# Patient Record
Sex: Male | Born: 1985
Health system: Southern US, Community
[De-identification: ages and names within clinical notes are randomized; demographics above are authoritative.]

## PROBLEM LIST (undated history)

## (undated) DIAGNOSIS — G5600 Carpal tunnel syndrome, unspecified upper limb: Secondary | ICD-10-CM

## (undated) DIAGNOSIS — M5126 Other intervertebral disc displacement, lumbar region: Secondary | ICD-10-CM

## (undated) DIAGNOSIS — E669 Obesity, unspecified: Secondary | ICD-10-CM

## (undated) DIAGNOSIS — T7840XA Allergy, unspecified, initial encounter: Secondary | ICD-10-CM

## (undated) DIAGNOSIS — I10 Essential (primary) hypertension: Secondary | ICD-10-CM

## (undated) DIAGNOSIS — F419 Anxiety disorder, unspecified: Secondary | ICD-10-CM

## (undated) DIAGNOSIS — F909 Attention-deficit hyperactivity disorder, unspecified type: Secondary | ICD-10-CM

## (undated) DIAGNOSIS — J45909 Unspecified asthma, uncomplicated: Secondary | ICD-10-CM

## (undated) DIAGNOSIS — G4730 Sleep apnea, unspecified: Secondary | ICD-10-CM

## (undated) DIAGNOSIS — F32A Depression, unspecified: Secondary | ICD-10-CM

## (undated) DIAGNOSIS — G4700 Insomnia, unspecified: Secondary | ICD-10-CM

## (undated) DIAGNOSIS — D649 Anemia, unspecified: Secondary | ICD-10-CM

## (undated) HISTORY — DX: Essential (primary) hypertension: I10

## (undated) HISTORY — DX: Depression, unspecified: F32.A

## (undated) HISTORY — DX: Anemia, unspecified: D64.9

## (undated) HISTORY — PX: LUMBAR DISC SURGERY: SHX700

## (undated) HISTORY — DX: Obesity, unspecified: E66.9

## (undated) HISTORY — DX: Attention-deficit hyperactivity disorder, unspecified type: F90.9

## (undated) HISTORY — DX: Unspecified asthma, uncomplicated: J45.909

## (undated) HISTORY — DX: Carpal tunnel syndrome, unspecified upper limb: G56.00

## (undated) HISTORY — DX: Anxiety disorder, unspecified: F41.9

## (undated) HISTORY — DX: Insomnia, unspecified: G47.00

## (undated) HISTORY — PX: BACK SURGERY: SHX140

## (undated) HISTORY — DX: Allergy, unspecified, initial encounter: T78.40XA

## (undated) HISTORY — DX: Other intervertebral disc displacement, lumbar region: M51.26

---

## 2010-01-22 ENCOUNTER — Emergency Department (HOSPITAL_COMMUNITY): Admission: EM | Admit: 2010-01-22 | Discharge: 2010-01-22 | Payer: Self-pay | Admitting: Emergency Medicine

## 2011-09-17 ENCOUNTER — Ambulatory Visit: Payer: Self-pay

## 2011-09-17 DIAGNOSIS — G5600 Carpal tunnel syndrome, unspecified upper limb: Secondary | ICD-10-CM

## 2011-11-11 ENCOUNTER — Ambulatory Visit: Payer: Self-pay | Admitting: Family Medicine

## 2011-11-11 VITALS — BP 132/82 | HR 89 | Temp 98.2°F | Resp 16 | Ht 68.25 in | Wt 275.0 lb

## 2011-11-11 DIAGNOSIS — G5600 Carpal tunnel syndrome, unspecified upper limb: Secondary | ICD-10-CM

## 2011-11-11 DIAGNOSIS — G5602 Carpal tunnel syndrome, left upper limb: Secondary | ICD-10-CM | POA: Insufficient documentation

## 2011-11-11 MED ORDER — PREDNISONE 20 MG PO TABS: ORAL_TABLET | ORAL | Status: AC

## 2011-11-11 NOTE — Progress Notes (Signed)
  Subjective:    Patient ID: Brian Alvarez, male    DOB: Nov 24, 1985, 26 y.o.   MRN: [id number]  HPI 26 yo male with h/o carpal tunnel syndrome, last seen here 09/17/11 for same.  Had been unable to afford referral to ortho.  Was referred then for nerve conduction studies.  NCS done 3-4 weeks ago.  Report not avail but per patient was told was CTS.  Bilateral but R>L (right-handed) Frequent pain, tingling, dropping things.  Mobic and neurontin help some but less so lately.  Works at [employer], so the grasping/manipulating problems. Never had problems with it until he started working there about 6 months ago.    Review of Systems    Negative except as per HPI  Objective:   Physical Exam  Constitutional: He appears well-developed and well-nourished.  Pulmonary/Chest: Effort normal.  Neurological: He is alert.  Skin: Skin is warm and dry.   No muscle wasting of hands.  Good grip strength.  Pain with tinnel's.        Assessment & Plan:  CTS - at this point not yet ready for surgery, primarily from financial standpoint.  Wants to wait on referral to ortho.  Also, not interested at this time in injections.  Will try one round of oral steroids.  Continue ice.  Try to wear braces at night ( has at home)

## 2011-12-06 ENCOUNTER — Other Ambulatory Visit: Payer: Self-pay | Admitting: Family Medicine

## 2011-12-10 ENCOUNTER — Ambulatory Visit: Payer: Self-pay | Admitting: Family Medicine

## 2011-12-10 VITALS — BP 147/76 | HR 92 | Temp 98.3°F | Resp 16 | Ht 68.5 in | Wt 268.0 lb

## 2011-12-10 DIAGNOSIS — J069 Acute upper respiratory infection, unspecified: Secondary | ICD-10-CM

## 2011-12-10 DIAGNOSIS — J029 Acute pharyngitis, unspecified: Secondary | ICD-10-CM

## 2011-12-10 MED ORDER — AMOXICILLIN 875 MG PO TABS: 875.0000 mg | ORAL_TABLET | Freq: Two times a day (BID) | ORAL | Status: AC

## 2011-12-10 NOTE — Progress Notes (Signed)
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  On Wednesday, the symptoms had largely cleared, but over the past 24 hours the symptoms have gotten much worse.  Initially had a fever intermittently.  No fever in 24 hours.  Also notes some diarrhea yesterday and today only.  No blood in stool.  Mild abdominal discomfort and nausea, no cramps.  O:  NAD HEENT:  Unremarkable with exception of red uvula and anterior pharynx Chest:  Few faint wheezes Neck: supple, no adenopathy Heart:  Reg, no murmur  A:  URI  P: Amox bid x 7 days

## 2011-12-10 NOTE — Patient Instructions (Signed)
Amoxicillin capsules or tablets What is this medicine? AMOXICILLIN (a mox i SIL in) is a penicillin antibiotic. It is used to treat certain kinds of bacterial infections. It will not work for colds, flu, or other viral infections. This medicine may be used for other purposes; ask your health care provider or pharmacist if you have questions. What should I tell my health care provider before I take this medicine? They need to know if you have any of these conditions: -asthma -kidney disease -an unusual or allergic reaction to amoxicillin, other penicillins, cephalosporin antibiotics, other medicines, foods, dyes, or preservatives -pregnant or trying to get pregnant -breast-feeding How should I use this medicine? Take this medicine by mouth with a glass of water. Follow the directions on your prescription label. You may take this medicine with food or on an empty stomach. Take your medicine at regular intervals. Do not take your medicine more often than directed. Take all of your medicine as directed even if you think your are better. Do not skip doses or stop your medicine early. Talk to your pediatrician regarding the use of this medicine in children. While this drug may be prescribed for selected conditions, precautions do apply. Overdosage: If you think you have taken too much of this medicine contact a poison control center or emergency room at once. NOTE: This medicine is only for you. Do not share this medicine with others. What if I miss a dose? If you miss a dose, take it as soon as you can. If it is almost time for your next dose, take only that dose. Do not take double or extra doses. What may interact with this medicine? -amiloride -birth control pills -chloramphenicol -macrolides -probenecid -sulfonamides -tetracyclines This list may not describe all possible interactions. Give your health care provider a list of all the medicines, herbs, non-prescription drugs, or dietary  supplements you use. Also tell them if you smoke, drink alcohol, or use illegal drugs. Some items may interact with your medicine. What should I watch for while using this medicine? Tell your doctor or health care professional if your symptoms do not improve in 2 or 3 days. Take all of the doses of your medicine as directed. Do not skip doses or stop your medicine early. If you are diabetic, you may get a false positive result for sugar in your urine with certain brands of urine tests. Check with your doctor. Do not treat diarrhea with over-the-counter products. Contact your doctor if you have diarrhea that lasts more than 2 days or if the diarrhea is severe and watery. What side effects may I notice from receiving this medicine? Side effects that you should report to your doctor or health care professional as soon as possible: -allergic reactions like skin rash, itching or hives, swelling of the face, lips, or tongue -breathing problems -dark urine -redness, blistering, peeling or loosening of the skin, including inside the mouth -seizures -severe or watery diarrhea -trouble passing urine or change in the amount of urine -unusual bleeding or bruising -unusually weak or tired -yellowing of the eyes or skin Side effects that usually do not require medical attention (report to your doctor or health care professional if they continue or are bothersome): -dizziness -headache -stomach upset -trouble sleeping This list may not describe all possible side effects. Call your doctor for medical advice about side effects. You may report side effects to FDA at 1-800-FDA-1088. Where should I keep my medicine? Keep out of the reach of children. Store between  68 and 77 degrees F (20 and 25 degrees C). Keep bottle closed tightly. Throw away any unused medicine after the expiration date. NOTE: This sheet is a summary. It may not cover all possible information. If you have questions about this medicine, talk  to your doctor, pharmacist, or health care provider.  2012, [employer]. (11/28/2007 2:10:59 PM)

## 2011-12-26 ENCOUNTER — Ambulatory Visit: Payer: Self-pay | Admitting: Family Medicine

## 2011-12-26 DIAGNOSIS — J3081 Allergic rhinitis due to animal (cat) (dog) hair and dander: Secondary | ICD-10-CM

## 2011-12-26 DIAGNOSIS — J45909 Unspecified asthma, uncomplicated: Secondary | ICD-10-CM

## 2011-12-26 MED ORDER — MOMETASONE FURO-FORMOTEROL FUM 200-5 MCG/ACT IN AERO: 2.0000 | INHALATION_SPRAY | Freq: Two times a day (BID) | RESPIRATORY_TRACT | Status: DC

## 2011-12-26 MED ORDER — PREDNISONE 20 MG PO TABS: ORAL_TABLET | ORAL | Status: DC

## 2011-12-26 NOTE — Patient Instructions (Signed)
Asthma Attack Prevention HOW CAN ASTHMA BE PREVENTED? Currently, there is no way to prevent asthma from starting. However, you can take steps to control the disease and prevent its symptoms after you have been diagnosed. Learn about your asthma and how to control it. Take an active role to control your asthma by working with your caregiver to create and follow an asthma action plan. An asthma action plan guides you in taking your medicines properly, avoiding factors that make your asthma worse, tracking your level of asthma control, responding to worsening asthma, and seeking emergency care when needed. To track your asthma, keep records of your symptoms, check your peak flow number using a peak flow meter (handheld device that shows how well air moves out of your lungs), and get regular asthma checkups.  Other ways to prevent asthma attacks include:  Use medicines as your caregiver directs.   Identify and avoid things that make your asthma worse (as much as you can).   Keep track of your asthma symptoms and level of control.   Get regular checkups for your asthma.   With your caregiver, write a detailed plan for taking medicines and managing an asthma attack. Then be sure to follow your action plan. Asthma is an ongoing condition that needs regular monitoring and treatment.   Identify and avoid asthma triggers. A number of outdoor allergens and irritants (pollen, mold, cold air, air pollution) can trigger asthma attacks. Find out what causes or makes your asthma worse, and take steps to avoid those triggers (see below).   Monitor your breathing. Learn to recognize warning signs of an attack, such as slight coughing, wheezing or shortness of breath. However, your lung function may already decrease before you notice any signs or symptoms, so regularly measure and record your peak airflow with a home peak flow meter.   Identify and treat attacks early. If you act quickly, you're less likely to have  a severe attack. You will also need less medicine to control your symptoms. When your peak flow measurements decrease and alert you to an upcoming attack, take your medicine as instructed, and immediately stop any activity that may have triggered the attack. If your symptoms do not improve, get medical help.   Pay attention to increasing quick-relief inhaler use. If you find yourself relying on your quick-relief inhaler (such as albuterol), your asthma is not under control. See your caregiver about adjusting your treatment.  IDENTIFY AND CONTROL FACTORS THAT MAKE YOUR ASTHMA WORSE A number of common things can set off or make your asthma symptoms worse (asthma triggers). Keep track of your asthma symptoms for several weeks, detailing all the environmental and emotional factors that are linked with your asthma. When you have an asthma attack, go back to your asthma diary to see which factor, or combination of factors, might have contributed to it. Once you know what these factors are, you can take steps to control many of them.  Allergies: If you have allergies and asthma, it is important to take asthma prevention steps at home. Asthma attacks (worsening of asthma symptoms) can be triggered by allergies, which can cause temporary increased inflammation of your airways. Minimizing contact with the substance to which you are allergic will help prevent an asthma attack. Animal Dander:   Some people are allergic to the flakes of skin or dried saliva from animals with fur or feathers. Keep these pets out of your home.   If you can't keep a pet outdoors, keep the   pet out of your bedroom and other sleeping areas at all times, and keep the door closed.   Remove carpets and furniture covered with cloth from your home. If that is not possible, keep the pet away from fabric-covered furniture and carpets.  Dust Mites:  Many people with asthma are allergic to dust mites. Dust mites are tiny bugs that are found in  every home, in mattresses, pillows, carpets, fabric-covered furniture, bedcovers, clothes, stuffed toys, fabric, and other fabric-covered items.   Cover your mattress in a special dust-proof cover.   Cover your pillow in a special dust-proof cover, or wash the pillow each week in hot water. Water must be hotter than 130 F to kill dust mites. Cold or warm water used with detergent and bleach can also be effective.   Wash the sheets and blankets on your bed each week in hot water.   Try not to sleep or lie on cloth-covered cushions.   Call ahead when traveling and ask for a smoke-free hotel room. Bring your own bedding and pillows, in case the hotel only supplies feather pillows and down comforters, which may contain dust mites and cause asthma symptoms.   Remove carpets from your bedroom and those laid on concrete, if you can.   Keep stuffed toys out of the bed, or wash the toys weekly in hot water or cooler water with detergent and bleach.  Cockroaches:  Many people with asthma are allergic to the droppings and remains of cockroaches.   Keep food and garbage in closed containers. Never leave food out.   Use poison baits, traps, powders, gels, or paste (for example, boric acid).   If a spray is used to kill cockroaches, stay out of the room until the odor goes away.  Indoor Mold:  Fix leaky faucets, pipes, or other sources of water that have mold around them.   Clean moldy surfaces with a cleaner that has bleach in it.  Pollen and Outdoor Mold:  When pollen or mold spore counts are high, try to keep your windows closed.   Stay indoors with windows closed from late morning to afternoon, if you can. Pollen and some mold spore counts are highest at that time.   Ask your caregiver whether you need to take or increase anti-inflammatory medicine before your allergy season starts.  Irritants:   Tobacco smoke is an irritant. If you smoke, ask your caregiver how you can quit. Ask family  members to quit smoking, too. Do not allow smoking in your home or car.   If possible, do not use a wood-burning stove, kerosene heater, or fireplace. Minimize exposure to all sources of smoke, including incense, candles, fires, and fireworks.   Try to stay away from strong odors and sprays, such as perfume, talcum powder, hair spray, and paints.   Decrease humidity in your home and use an indoor air cleaning device. Reduce indoor humidity to below 60 percent. Dehumidifiers or central air conditioners can do this.   Try to have someone else vacuum for you once or twice a week, if you can. Stay out of rooms while they are being vacuumed and for a short while afterward.   If you vacuum, use a dust mask from a hardware store, a double-layered or microfilter vacuum cleaner bag, or a vacuum cleaner with a HEPA filter.   Sulfites in foods and beverages can be irritants. Do not drink beer or wine, or eat dried fruit, processed potatoes, or shrimp if they cause asthma   symptoms.   Cold air can trigger an asthma attack. Cover your nose and mouth with a scarf on cold or windy days.   Several health conditions can make asthma more difficult to manage, including runny nose, sinus infections, reflux disease, psychological stress, and sleep apnea. Your caregiver will treat these conditions, as well.   Avoid close contact with people who have a cold or the flu, since your asthma symptoms may get worse if you catch the infection from them. Wash your hands thoroughly after touching items that may have been handled by people with a respiratory infection.   Get a flu shot every year to protect against the flu virus, which often makes asthma worse for days or weeks. Also get a pneumonia shot once every five to 10 years.  Drugs:  Aspirin and other painkillers can cause asthma attacks. 10% to 20% of people with asthma have sensitivity to aspirin or a group of painkillers called non-steroidal anti-inflammatory drugs  (NSAIDS), such as ibuprofen and naproxen. These drugs are used to treat pain and reduce fevers. Asthma attacks caused by any of these medicines can be severe and even fatal. These drugs must be avoided in people who have known aspirin sensitive asthma. Products with acetaminophen are considered safe for people who have asthma. It is important that people with aspirin sensitivity read labels of all over-the-counter drugs used to treat pain, colds, coughs, and fever.   Beta blockers and ACE inhibitors are other drugs which you should discuss with your caregiver, in relation to your asthma.  ALLERGY SKIN TESTING  Ask your asthma caregiver about allergy skin testing or blood testing (RAST test) to identify the allergens to which you are sensitive. If you are found to have allergies, allergy shots (immunotherapy) for asthma may help prevent future allergies and asthma. With allergy shots, small doses of allergens (substances to which you are allergic) are injected under your skin on a regular schedule. Over a period of time, your body may become used to the allergen and less responsive with asthma symptoms. You can also take measures to minimize your exposure to those allergens. EXERCISE  If you have exercise-induced asthma, or are planning vigorous exercise, or exercise in cold, humid, or dry environments, prevent exercise-induced asthma by following your caregiver's advice regarding asthma treatment before exercising. Document Released: 08/25/2009 Document Revised: 08/26/2011 Document Reviewed: 08/25/2009 Exit[employer] Patient Information 2012 [employer]. 

## 2011-12-26 NOTE — Progress Notes (Signed)
26 yo with asthma.  Worsening x 24 hours after walking by a perfume.  Using advair and ventolin Now almost complete stopped cigarettes with electronic cigs.  O:  Exp wheezes bilaterally, no resp distress HEENT unremarkable  A:  Asthma flare, doing better with the cigarettes.  P:  Prednisone qd x 3 days 40 mg Dulera 200 bid

## 2012-01-07 ENCOUNTER — Ambulatory Visit: Payer: Self-pay | Admitting: Family Medicine

## 2012-01-07 DIAGNOSIS — G5600 Carpal tunnel syndrome, unspecified upper limb: Secondary | ICD-10-CM

## 2012-01-07 DIAGNOSIS — K029 Dental caries, unspecified: Secondary | ICD-10-CM

## 2012-01-07 MED ORDER — MELOXICAM 7.5 MG PO TABS: 7.5000 mg | ORAL_TABLET | Freq: Every day | ORAL | Status: DC

## 2012-01-07 MED ORDER — AMOXICILLIN 500 MG PO CAPS: 500.0000 mg | ORAL_CAPSULE | Freq: Three times a day (TID) | ORAL | Status: AC

## 2012-01-07 NOTE — Progress Notes (Signed)
[facility]:  Office Visit  Chief Complaint:  Chief Complaint  Patient presents with  . Dental Pain    x 2 days  . Wrist Pain    both    HPI: Brian Alvarez is a 26 y.o. male who complains of   1. Tooth ache on right side x 2 days, dental caries 2. Carpal Tunnel bilaterally, started 1-1.5 months after working at [employer]. Numbness and tingling. He is here to get a work restriction note that is updated with the correct dates. Has gotten to see neurology for this. Today he is here for the note with the correct date only, this is not an assessment.   Past Medical History  Diagnosis Date  . Carpal tunnel syndrome   . Allergy   . Anemia    History reviewed. No pertinent past surgical history. History   Social History  . Marital Status: Single    Spouse Name: N/A    Number of Children: N/A  . Years of Education: N/A   Social History Main Topics  . Smoking status: Current Everyday Smoker  . Smokeless tobacco: None   Comment: 5 cigarettes per day  . Alcohol Use: Yes  . Drug Use: No  . Sexually Active: None   Other Topics Concern  . None   Social History Narrative  . None   No family history on file. No Known Allergies Prior to Admission medications   Medication Sig Start Date End Date Taking? Authorizing Provider  albuterol (PROVENTIL HFA;VENTOLIN HFA) 108 (90 BASE) MCG/ACT inhaler Inhale 2 puffs into the lungs every 6 (six) hours as needed.   Yes Historical Provider, MD  b complex vitamins tablet Take 1 tablet by mouth daily.   Yes Historical Provider, MD  Fluticasone-Salmeterol (ADVAIR) 100-50 MCG/DOSE AEPB Inhale 1 puff into the lungs every 12 (twelve) hours.   Yes Historical Provider, MD  gabapentin (NEURONTIN) 300 MG capsule Take 300 mg by mouth 3 (three) times daily.   Yes Historical Provider, MD  meloxicam (MOBIC) 7.5 MG tablet Take 7.5 mg by mouth daily.   Yes Historical Provider, MD  Mometasone Furo-Formoterol Fum 200-5 MCG/ACT AERO Inhale 2  puffs into the lungs 2 (two) times daily. 12/26/11  Yes Elvina Sidle, MD  predniSONE (DELTASONE) 20 MG tablet 2 daily 12/26/11   Elvina Sidle, MD     ROS: The patient denies fevers, chills, night sweats, unintentional weight loss, chest pain, palpitations, wheezing, dyspnea on exertion, nausea, vomiting, abdominal pain, dysuria, hematuria, melena,+ dental pain and  numbness, weakness, or tingling.   All other systems have been reviewed and were otherwise negative with the exception of those mentioned in the HPI and as above.    PHYSICAL EXAM: Filed Vitals:   01/07/12 1649  BP: 111/77  Pulse: 71  Temp: 97.9 F (36.6 C)  Resp: 20   Filed Vitals:   01/07/12 1649  Height: 5' 8.5" (1.74 m)  Weight: 267 lb 12.8 oz (121.473 kg)   Body mass index is 40.13 kg/(m^2).  General: Alert, no acute distress HEENT:  Normocephalic, atraumatic, oropharynx patent. + dental caries  Cardiovascular:  Regular rate and rhythm, no rubs murmurs or gallops.  No Carotid bruits, radial pulse intact. No pedal edema.  Respiratory: Clear to auscultation bilaterally.  No wheezes, rales, or rhonchi.  No cyanosis, no use of accessory musculature GI: No organomegaly, abdomen is soft and non-tender, positive bowel sounds.  No masses. Skin: No rashes. Neurologic: Facial musculature symmetric. Psychiatric: Patient  is appropriate throughout our interaction. Lymphatic: No cervical lymphadenopathy Musculoskeletal: Gait intact.  Full ROM in bilateral wrist No tinels sign, unequivocal Phalens  LABS: No results found for this or any previous visit.   EKG/XRAY:   Primary read interpreted by Dr. Conley Rolls at [facility].   ASSESSMENT/PLAN: Encounter Diagnoses  Name Primary?  . Dental caries Yes  . Carpal tunnel syndrome     1. Amoxacillin for dental caries and prevention of infection. Get teeth fixed ASAP. Patient is currently waiting for appt in 2 weeks, does not have dental insurance so is waiting for low income clinic  to have opening. Rx Mobic for pain control.  2. Gave patient a reprinted note for his carpal tunnel ( this is WC pedning so I did not do an extensive exam, it was a straight copy of a preexisting WC from )   Drianna Chandran PHUONG, DO 01/08/2012 2:23 PM

## 2012-02-11 ENCOUNTER — Other Ambulatory Visit: Payer: Self-pay | Admitting: Family Medicine

## 2012-07-16 ENCOUNTER — Ambulatory Visit: Payer: Self-pay | Admitting: Family Medicine

## 2012-07-16 VITALS — BP 137/81 | HR 120 | Temp 98.6°F | Resp 18 | Ht 68.75 in | Wt 293.6 lb

## 2012-07-16 DIAGNOSIS — R112 Nausea with vomiting, unspecified: Secondary | ICD-10-CM

## 2012-07-16 DIAGNOSIS — R197 Diarrhea, unspecified: Secondary | ICD-10-CM

## 2012-07-16 DIAGNOSIS — A059 Bacterial foodborne intoxication, unspecified: Secondary | ICD-10-CM

## 2012-07-16 MED ORDER — CIPROFLOXACIN HCL 500 MG PO TABS: 500.0000 mg | ORAL_TABLET | Freq: Two times a day (BID) | ORAL | Status: DC

## 2012-07-16 MED ORDER — ONDANSETRON 4 MG PO TBDP
8.0000 mg | ORAL_TABLET | Freq: Once | ORAL | Status: AC
  Administered 2012-07-16: 8 mg via ORAL

## 2012-07-16 MED ORDER — ONDANSETRON HCL 8 MG PO TABS: 8.0000 mg | ORAL_TABLET | Freq: Three times a day (TID) | ORAL | Status: DC | PRN

## 2012-07-16 NOTE — Patient Instructions (Addendum)
Drink sips of fluids today.  If you want to eat start with bland foods such as crackers.  If you are not getting better as the day goes on feel free to call, and start the cipro.  If you start to have a lot of bloody diarrhea call first!

## 2012-07-16 NOTE — Progress Notes (Signed)
[facility] [address] [place] [address] [phone]  Date:  07/16/2012   Name:  Brian Alvarez   DOB:  [birth year]   MRN:  [id number]  PCP:  No primary provider on file.    Chief Complaint: Nausea and Diarrhea   History of Present Illness:  Brian Alvarez is a 26 y.o. very pleasant male patient who presents with the following:  He is here today with possible food poisoning.  He and his GF ate out at a new restaurant last night.  Around 1am he awoke with nausea and vomiting.  He went back to bed, but at 2am was awoken with more vomiting.  This continued through the night.  He also began to have diarrhea around 3:30 am.    They have not noted a fever but did not take his temperature either.  He did have sweats and chills when he was ill during the night.   He had diarrhea around 4 times, vomited around 10 times. He has not been able to keep down any liquids.   His GF is a little queasy- they did eat the same food but he ate "more of the cheese fries."    No blood in vomit or diarrhea.    Patient Active Problem List  Diagnosis  . Carpal tunnel syndrome  . Asthma    Past Medical History  Diagnosis Date  . Carpal tunnel syndrome   . Allergy   . Anemia   . Carpal tunnel syndrome     No past surgical history on file.  History  Substance Use Topics  . Smoking status: Current Every Day Smoker  . Smokeless tobacco: Not on file   Comment: has switched to electronic cigarette  . Alcohol Use: Yes    No family history on file.  No Known Allergies  Medication list has been reviewed and updated.  Current Outpatient Prescriptions on File Prior to Visit  Medication Sig Dispense Refill  . albuterol (PROVENTIL HFA;VENTOLIN HFA) 108 (90 BASE) MCG/ACT inhaler Inhale 2 puffs into the lungs every 6 (six) hours as needed.      Marland Kitchen b complex vitamins tablet Take 1 tablet by mouth daily.      . Fluticasone-Salmeterol (ADVAIR) 100-50 MCG/DOSE AEPB Inhale 1 puff  into the lungs every 12 (twelve) hours.      . gabapentin (NEURONTIN) 300 MG capsule TAKE 2 CAPSULES BY MOUTH EVERY EVENING  60 capsule  0  . meloxicam (MOBIC) 7.5 MG tablet Take 1 tablet (7.5 mg total) by mouth daily.  30 tablet  1  . Mometasone Furo-Formoterol Fum 200-5 MCG/ACT AERO Inhale 2 puffs into the lungs 2 (two) times daily.  1 Inhaler  3  . predniSONE (DELTASONE) 20 MG tablet 2 daily  6 tablet  0    Review of Systems:  As per HPI- otherwise negative.   Physical Examination: Filed Vitals:   07/16/12 0831  BP: 137/81  Pulse: 120  Temp: 98.6 F (37 C)  Resp: 18   Filed Vitals:   07/16/12 0831  Height: 5' 8.75" (1.746 m)  Weight: 293 lb 9.6 oz (133.176 kg)   Body mass index is 43.67 kg/(m^2). Ideal Body Weight: Weight in (lb) to have BMI = 25: 167.7   GEN: WDWN, NAD, Non-toxic, A & O x 3, obese HEENT: Atraumatic, Normocephalic. Neck supple. No masses, No LAD. Ears and Nose: No external deformity. CV: RRR but tachycardic, No M/G/R. No JVD. No thrill. No extra heart  sounds. PULM: CTA B, no wheezes, crackles, rhonchi. No retractions. No resp. distress. No accessory muscle use. ABD: S, NT, ND, +BS. No rebound. No HSM.  He notes "more pressure" in the epigastric area with palpation but not pain/ tenderness EXTR: No c/c/e NEURO Normal gait.  PSYCH: Normally interactive. Conversant. Not depressed or anxious appearing.  Calm demeanor.   zofran 4mg  OCT #2 given at 8:45 am.  He was treated with a liter of IV saline and felt a lot better.  His pulse fell to 90 BPM and he was able to drink some gatorade after he took zofran Assessment and Plan: 1. Nausea & vomiting  ondansetron (ZOFRAN-ODT) disintegrating tablet 8 mg, ciprofloxacin (CIPRO) 500 MG tablet, ondansetron (ZOFRAN) 8 MG tablet  2. Diarrhea    3. Food poisoning     Likely food borne illness- see pt instructions for more information.  He felt a lot better after zofran and a liter of IVF.  He will treat himself  symptomatically and let us know if not getting better  Abbe Amsterdam, MD

## 2012-09-12 ENCOUNTER — Ambulatory Visit: Payer: Self-pay | Admitting: Physician Assistant

## 2012-09-12 VITALS — BP 132/79 | HR 76 | Temp 98.0°F | Resp 16 | Ht 70.0 in | Wt 301.0 lb

## 2012-09-12 DIAGNOSIS — J302 Other seasonal allergic rhinitis: Secondary | ICD-10-CM | POA: Insufficient documentation

## 2012-09-12 DIAGNOSIS — J309 Allergic rhinitis, unspecified: Secondary | ICD-10-CM

## 2012-09-12 DIAGNOSIS — J45909 Unspecified asthma, uncomplicated: Secondary | ICD-10-CM

## 2012-09-12 MED ORDER — FLUTICASONE PROPIONATE 50 MCG/ACT NA SUSP: 2.0000 | Freq: Every day | NASAL | Status: DC

## 2012-09-12 MED ORDER — MONTELUKAST SODIUM 10 MG PO TABS: 10.0000 mg | ORAL_TABLET | Freq: Every day | ORAL | Status: DC

## 2012-09-12 MED ORDER — FLUTICASONE-SALMETEROL 250-50 MCG/DOSE IN AEPB: 1.0000 | INHALATION_SPRAY | Freq: Two times a day (BID) | RESPIRATORY_TRACT | Status: DC

## 2012-09-12 MED ORDER — ALBUTEROL SULFATE HFA 108 (90 BASE) MCG/ACT IN AERS: 2.0000 | INHALATION_SPRAY | Freq: Four times a day (QID) | RESPIRATORY_TRACT | Status: DC | PRN

## 2012-09-12 NOTE — Progress Notes (Signed)
   [address], [place] [address]   Phone [phone]  Subjective:    Patient ID: Brian Alvarez, male    DOB: 27-Jun-1986, 26 y.o.   MRN: [id number]  HPI  Pt presents to clinic for med refill for persistent moderate asthma.  He ran out of his albuterol several weeks ago and has been having trouble breathing since then.  He uses it sometimes 2-3 times a day multiple times a week and then sometimes he will uses it only once a week.  He needs it when he gets around dust and any animal.  He lives with a cat.  He uses benadryl prn for his allergies - OTC antihistamines keep him awake for days.  He has never tried any Rx allergy medication.  He uses his Advair once a day. He currently feels well without nay cold symptoms.   Review of Systems  HENT: Negative for congestion and rhinorrhea.   Respiratory: Positive for shortness of breath (not acute). Negative for cough and wheezing.        Objective:   Physical Exam  Vitals reviewed. Constitutional: He is oriented to person, place, and time. He appears well-developed and well-nourished.  HENT:  Head: Normocephalic and atraumatic.  Right Ear: Hearing, tympanic membrane, external ear and ear canal normal.  Left Ear: Hearing, tympanic membrane, external ear and ear canal normal.  Nose: Mucosal edema (pale) present.  Mouth/Throat: Uvula is midline and oropharynx is clear and moist. No oropharyngeal exudate.  Eyes: Conjunctivae normal are normal.  Neck: Neck supple.  Pulmonary/Chest: Effort normal and breath sounds normal. No respiratory distress. He has no wheezes.  Lymphadenopathy:    He has no cervical adenopathy.  Neurological: He is alert and oriented to person, place, and time.  Skin: Skin is warm and dry.  Psychiatric: He has a normal mood and affect. His behavior is normal. Judgment and thought content normal.   Peak flow was 375 - should be close to 600.    Assessment & Plan:   1. Seasonal allergies  fluticasone (FLONASE) 50  MCG/ACT nasal spray, montelukast (SINGULAIR) 10 MG tablet  2. Asthma  montelukast (SINGULAIR) 10 MG tablet, albuterol (PROVENTIL HFA;VENTOLIN HFA) 108 (90 BASE) MCG/ACT inhaler, Fluticasone-Salmeterol (ADVAIR) 250-50 MCG/DOSE AEPB, DISCONTINUED: albuterol (PROVENTIL HFA;VENTOLIN HFA) 108 (90 BASE) MCG/ACT inhaler   D/w pt that his asthma is not controlled based on his albuterol usage which I think is really related to his untreated allergies.  I also think that he is not using his Advair correctly which is increasing his albuterol usage.  He was on allergy injections as a child and needs medications for them to get his asthma controlled.  Will start with Flonase due to being part of the [place] program for medications through [employer].  Will start Singulair also.  Pt to really monitor his exposures to known allergens, esp his cat at his house, but he is not interested in changing that at this time.  He feels that he gets desensitized to the cat that he lives with.  Pt will call if the medication changes help for refills.  He understands and agrees with the above plan.

## 2012-09-12 NOTE — Patient Instructions (Signed)
Increased Advair to 1 puff 2x/day Add Flonase (nasal spray) to help control allergies. Add Singulair (pill) to help control allergies and asthma. Our goal is to decrease use of Ventolin inhaler to less than 2x/wk during the day and less than 2x/night a month.

## 2012-09-21 ENCOUNTER — Telehealth: Payer: Self-pay | Admitting: *Deleted

## 2012-09-21 NOTE — Telephone Encounter (Signed)
Pharmacy requesting 30 day in advance rx for Singulair refills to put on pt profile.  Last filled  09/12/12

## 2012-09-22 NOTE — Telephone Encounter (Signed)
Please call patient - per Sarah's note he was to let us know if treatment working, and if so ok to refill. If not working we are going to investigate further treatment options if not.

## 2012-09-22 NOTE — Telephone Encounter (Signed)
LMOM for pt to CB to let us know if Singulair is controlling his Sxs before we send in another RF for him.

## 2012-09-25 NOTE — Telephone Encounter (Signed)
LMOM to CB to let us know if Singular is working.

## 2012-09-26 NOTE — Telephone Encounter (Signed)
Unable to reach letter sent

## 2012-10-15 ENCOUNTER — Ambulatory Visit: Payer: Self-pay | Admitting: Emergency Medicine

## 2012-10-15 VITALS — BP 116/73 | HR 118 | Temp 98.6°F | Resp 18 | Ht 68.5 in | Wt 298.6 lb

## 2012-10-15 DIAGNOSIS — L509 Urticaria, unspecified: Secondary | ICD-10-CM

## 2012-10-15 MED ORDER — ONDANSETRON 8 MG PO TBDP: 8.0000 mg | ORAL_TABLET | Freq: Three times a day (TID) | ORAL | Status: DC | PRN

## 2012-10-15 MED ORDER — PREDNISONE 10 MG PO KIT: PACK | ORAL | Status: DC

## 2012-10-15 NOTE — Patient Instructions (Addendum)

## 2012-10-15 NOTE — Progress Notes (Signed)
[facility] [address], [place] [address] [phone]  Date:  10/15/2012   Name:  Brian Alvarez   DOB:  May 19, 1986   MRN:  [id number]  PCP:  No primary provider on file.    Chief Complaint: Rash   History of Present Illness:  Brian Alvarez is a 27 y.o. very pleasant male patient who presents with the following:  No history of penicillin allergy.  Was given penicillin Monday for a dental extraction and took the last pill on Thursday.  Friday, he developed an erythematous rash on the left side of his face.  By Saturday the rash had spread and enlarged and become pruritic involving a more generalized area excluding the back.  He has no fever or chills.  No arthralgias or myalgias.  No cough or coryza, is nauseated but no vomiting, no wheezing or shortness of breath.  No impairment of appetite.  Patient Active Problem List  Diagnosis  . Carpal tunnel syndrome  . Asthma  . Seasonal allergies    Past Medical History  Diagnosis Date  . Carpal tunnel syndrome   . Allergy   . Anemia   . Carpal tunnel syndrome   . Asthma     No past surgical history on file.  History  Substance Use Topics  . Smoking status: Former Games developer  . Smokeless tobacco: Not on file     Comment: has switched to electronic cigarette  . Alcohol Use: Yes     Comment: occassional    No family history on file.  No Known Allergies  Medication list has been reviewed and updated.  Current Outpatient Prescriptions on File Prior to Visit  Medication Sig Dispense Refill  . albuterol (PROVENTIL HFA;VENTOLIN HFA) 108 (90 BASE) MCG/ACT inhaler Inhale 2 puffs into the lungs every 6 (six) hours as needed.  3 Inhaler  1  . b complex vitamins tablet Take 1 tablet by mouth daily.      . diphenhydrAMINE (SOMINEX) 25 MG tablet Take 25 mg by mouth 4 (four) times daily as needed.      . fluticasone (FLONASE) 50 MCG/ACT nasal spray Place 2 sprays into the nose daily.  16 g  0  .  Fluticasone-Salmeterol (ADVAIR) 250-50 MCG/DOSE AEPB Inhale 1 puff into the lungs every 12 (twelve) hours.  180 each  1  . ibuprofen (ADVIL,MOTRIN) 400 MG tablet Take 400 mg by mouth every 6 (six) hours as needed.      . montelukast (SINGULAIR) 10 MG tablet Take 1 tablet (10 mg total) by mouth at bedtime.  30 tablet  0  . Multiple Vitamins-Minerals (MULTIVITAMIN WITH MINERALS) tablet Take 1 tablet by mouth daily.        Review of Systems:  As per HPI, otherwise negative.    Physical Examination: Filed Vitals:   10/15/12 1115  BP: 116/73  Pulse: 118  Temp: 98.6 F (37 C)  Resp: 18   Filed Vitals:   10/15/12 1115  Height: 5' 8.5" (1.74 m)  Weight: 298 lb 9.6 oz (135.444 kg)   Body mass index is 44.74 kg/(m^2). Ideal Body Weight: Weight in (lb) to have BMI = 25: 166.5   GEN: WDWN, NAD, Non-toxic, A & O x 3 HEENT: Atraumatic, Normocephalic. Neck supple. No masses, No LAD. Ears and Nose: No external deformity. CV: RRR, No M/G/R. No JVD. No thrill. No extra heart sounds. PULM: CTA B, no wheezes, crackles, rhonchi. No retractions. No resp. distress. No accessory muscle use. ABD:  S, NT, ND, +BS. No rebound. No HSM. EXTR: No c/c/e NEURO Normal gait.  PSYCH: Normally interactive. Conversant. Not depressed or anxious appearing.  Calm demeanor.  Skin: generalized hives  Assessment and Plan: Urticarial reaction to penicillin Avoid penicillin sterapred benadryl  Carmelina Dane, MD

## 2012-10-17 ENCOUNTER — Ambulatory Visit: Payer: Self-pay | Admitting: Family Medicine

## 2012-10-17 ENCOUNTER — Telehealth: Payer: Self-pay

## 2012-10-17 VITALS — BP 116/77 | HR 103 | Temp 97.3°F | Resp 18 | Ht 70.0 in | Wt 304.0 lb

## 2012-10-17 DIAGNOSIS — L509 Urticaria, unspecified: Secondary | ICD-10-CM

## 2012-10-17 MED ORDER — EPINEPHRINE 0.3 MG/0.3ML IJ DEVI: 0.3000 mg | Freq: Once | INTRAMUSCULAR | Status: DC

## 2012-10-17 MED ORDER — METHYLPREDNISOLONE ACETATE 80 MG/ML IJ SUSP
120.0000 mg | Freq: Once | INTRAMUSCULAR | Status: AC
  Administered 2012-10-17: 120 mg via INTRAMUSCULAR

## 2012-10-17 NOTE — Progress Notes (Signed)
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 The tremendously itchy rash began one day after he finished his penicillin prescription. He was given a prednisone 12 day taper several days ago but this has not helped. He's also continuing to take Benadryl 6 times a day which is also not made a dent on the symptoms.  Objective: Patient has diffuse urticaria over his entire torso arms and legs and palms of his hands.  Chest is clear  Oropharynx is clear  Assessment: Urticaria secondary to penicillin  Plan: Depo-Medrol 120 IM in addition to the prednisone Add ranitidine

## 2012-10-17 NOTE — Telephone Encounter (Signed)
Spoke to him to advise. May take a little longer for this to resolve, he is not having any breathing difficulty or worsening. He is advised to continue to take the benadryl and zantac, I advised him also he can take Claritin in the am, and Benadryl at night. FYI

## 2012-10-17 NOTE — Telephone Encounter (Signed)
Patient called wanting to ask Dr.Lauenstein that the shot he was given today for his allergic reaction hasn't had any affect at all. He says his symptoms haven't changed since his visit and needs to speak to a clinical person about this. Please call back at [phone]

## 2012-10-28 ENCOUNTER — Other Ambulatory Visit: Payer: Self-pay | Admitting: Physician Assistant

## 2013-03-16 ENCOUNTER — Other Ambulatory Visit: Payer: Self-pay | Admitting: Physician Assistant

## 2013-06-25 ENCOUNTER — Ambulatory Visit: Payer: Self-pay | Admitting: Family Medicine

## 2013-06-25 VITALS — BP 120/80 | HR 77 | Temp 99.2°F | Resp 18 | Ht 69.5 in | Wt 288.0 lb

## 2013-06-25 DIAGNOSIS — J45909 Unspecified asthma, uncomplicated: Secondary | ICD-10-CM

## 2013-06-25 MED ORDER — ALBUTEROL SULFATE HFA 108 (90 BASE) MCG/ACT IN AERS: 2.0000 | INHALATION_SPRAY | Freq: Four times a day (QID) | RESPIRATORY_TRACT | Status: DC | PRN

## 2013-06-25 MED ORDER — FLUTICASONE-SALMETEROL 250-50 MCG/DOSE IN AEPB: 1.0000 | INHALATION_SPRAY | Freq: Two times a day (BID) | RESPIRATORY_TRACT | Status: DC

## 2013-06-25 MED ORDER — MONTELUKAST SODIUM 10 MG PO TABS: 10.0000 mg | ORAL_TABLET | Freq: Every day | ORAL | Status: DC

## 2013-06-25 NOTE — Patient Instructions (Addendum)
Asthma, Adult Asthma is a condition that affects your lungs. It is characterized by swelling and narrowing of your airways as well as increased mucus production. The narrowing comes from swelling and muscle spasms inside the airways. When this happens, breathing can be difficult and you can have coughing, wheezing, and shortness of breath. Knowing more about asthma can help you manage it better. Asthma cannot be cured, but medicines and lifestyle changes can help control it. Asthma can be a minor problem for some people but if it is not controlled it can lead to a life-threatening asthma attack. Asthma can change over time. It is important to work with your caregiver to manage your asthma symptoms. CAUSES The exact cause of asthma is unknown. Asthma is believed to be caused by inherited (genetic) and environmental exposures. Swelling and redness (inflammation) of the airways occurs in asthma. This can be triggered by allergies, viral lung infections, or irritants in the air. Allergic reactions can cause you to wheeze immediately or several hours after an exposure. Asthma triggers are different for each person. It is important to pay attention and know what triggers your asthma.  Common triggers for asthma attacks include:  Animal dander from the skin, hair, or feathers of animals.  Dust mites contained in house dust.  Cockroaches.  Pollen from trees or grass.  Mold.  Cigarette or tobacco smoke. Smoking cannot be allowed in homes of people with asthma. People with asthma should not smoke and should not be around smokers.  Air pollutants such as dust, household cleaners, hair sprays, aerosol sprays, paint fumes, strong chemicals, or strong odors.  Cold air or weather changes. Cold air may cause inflammation. Winds increase molds and pollens in the air. There is not one best climate for people with asthma.  Strong emotions such as crying or laughing hard.  Stress.  Certain medicines such as  aspirin or beta-blockers.  Sulfites in such foods and drinks as dried fruits and wine.  Infections or inflammatory conditions such as the flu, a cold, or an inflammation of the nasal membranes (rhinitis).  Gastroesophageal reflux disease (GERD). GERD is a condition where stomach acid backs up into your throat (esophagus).  Exercise or strenous activity. Proper pre-exercise medicines allow most people to participate in sports. SYMPTOMS  Feeling short of breath.  Chest tightness or pain.  Difficulty sleeping due to coughing, wheezing, or feeling short of breath.  A whistling or wheezing sound with exhalation.  Coughing or wheezing that is worse when you:  Have a virus (such as a cold or the flu).  Are suffering from allergies.  Are exposed to certain fumes or chemicals.  Exercise. Signs that your asthma is probably getting worse include:   More frequent and bothersome asthma signs and symptoms.  Increasing difficulty breathing. This can be measured by a peak flow meter, which is a simple device used to check how well your lungs are working.  An increasingly frequent need to use a quick-relief inhaler. DIAGNOSIS  The diagnosis of asthma is made by review of your medical history, a physical exam, and possibly from other tests. Lung function studies may help with the diagnosis. TREATMENT  Asthma cannot be cured. However, for the majority of adults, asthma can be controlled with treatment. Besides avoidance of triggers of your asthma, medicines are often required. There are 2 classes of medicine used for asthma treatment: controller medicines (reduce inflammation and symptoms) andreliever or rescue medicines (relieve asthma symptoms during acute attacks). You may require daily   medicines to control your asthma. The most effective long-term controller medicines for asthma are inhaled corticosteroids (blocks inflammation). Other long-term control medicines include:  Leukotriene  receptor antagonists (blocks a pathway of inflammation).  Long-acting beta2-agonists (relaxes the muscles of the airways for at least 12 hours) with an inhaled corticosteroid.  Cromolyn sodium or nedocromil (alters certain inflammatory cells' ability to release chemicals that cause inflammation).  Immunomodulators (alters the immune system to prevent asthma symptoms).  Theophylline (relaxes muscles in the airways). You may also require a short-acting beta2-agonist to relieve asthma symptoms during an acute attack. You should understand what to do during an acute attack. Inhaled medicines are effective when used properly. Read the instructions on how to use your medicines correctly and speak to your caregiver if you have questions. Follow up with your caregiver on a regular basis to make sure your asthma is well-controlled. If your asthma is not well-controlled, if you have been hospitalized for asthma, or if multiple medicines or medium to high doses of inhaled corticosteroids are needed to control your asthma, request a referral to an asthma specialist. HOME CARE INSTRUCTIONS   Take medicines as directed by your caregiver.  Control your home environment in the following ways to help prevent asthma attacks:  Change your heating and air conditioning filter at least once a month.  Place a filter or cheesecloth over your heating and air conditioning vents.  Limit the use of fireplaces and wood stoves.  Do not smoke. Do not stay in places where others are smoking.  Get rid of pests (such as roaches and mice) and their droppings.  If you see mold on a plant, throw it away.  Clean your floors and dust every week. Use unscented cleaning products. Use a vacuum cleaner with a HEPA filter if possible. If vacuuming or cleaning triggers your asthma, try to find someone else to do these chores.  Floors in your house should be wood, tile, or vinyl. Carpet can trap dander and dust.  Use  allergy-proof pillows, mattress covers, and box spring covers.  Wash bedsheets and blankets every week in hot water and dry in a dryer.  Use a blanket that is made of polyester or cotton with a tight nap.  Do not use a dust ruffle on your bed.  Clean bathrooms and kitchens with bleach and repaint with mold-resistant paint.  Wash hands frequently.  Talk to your caregiver about an action plan for managing asthma attacks. This includes the use of a peak flow meter which measures the severity of the attack and medicines that can help stop the attack. An action plan can help minimize or stop the attack without having to seek medical care.  Remain calm during an asthma attack.  Always have a plan prepared for seeking medical attention. This should include contacting your caregiver and in the case of a severe attack, calling your local emergency services (911 in [place] SEEK MEDICAL CARE IF:   You have wheezing, shortness of breath, or a cough even if taking medicine to prevent attacks.  You have thickening of sputum.  Your sputum changes from clear or white to yellow, green, gray, or bloody.  You have any problems that may be related to the medicines you are taking (such as a rash, itching, swelling, or trouble breathing).  You are using a reliever medicine more than 2 3 times per week.  Your peak flow is still at 50 79% of personal best after following your action plan for 1   hour. SEEK IMMEDIATE MEDICAL CARE IF:   You are short of breath even at rest.  You get short of breath when doing very little physical activity.  You have difficulty eating, drinking, or talking due to asthma symptoms.  You have chest pain or you feel that your heart is beating fast.  You have a bluish color to your lips or fingernails.  You are lightheaded, dizzy, or faint.  You have a fever or persistent symptoms for more than 2 3 days.  You have a fever and symptoms suddenly get worse.  You seem to be  getting worse and are unresponsive to treatment during an asthma attack.  Your peak flow is less than 50% of personal best. MAKE SURE YOU:   Understand these instructions.  Will watch your condition.  Will get help right away if you are not doing well or get worse. Document Released: 09/06/2005 Document Revised: 08/23/2012 Document Reviewed: 04/24/2008 [facility] Patient Information 2014 [place], [place]. Insomnia Insomnia is frequent trouble falling and/or staying asleep. Insomnia can be a long term problem or a short term problem. Both are common. Insomnia can be a short term problem when the wakefulness is related to a certain stress or worry. Long term insomnia is often related to ongoing stress during waking hours and/or poor sleeping habits. Overtime, sleep deprivation itself can make the problem worse. Every little thing feels more severe because you are overtired and your ability to cope is decreased. CAUSES   Stress, anxiety, and depression.  Poor sleeping habits.  Distractions such as TV in the bedroom.  Naps close to bedtime.  Engaging in emotionally charged conversations before bed.  Technical reading before sleep.  Alcohol and other sedatives. They may make the problem worse. They can hurt normal sleep patterns and normal dream activity.  Stimulants such as caffeine for several hours prior to bedtime.  Pain syndromes and shortness of breath can cause insomnia.  Exercise late at night.  Changing time zones may cause sleeping problems (jet lag). It is sometimes helpful to have someone observe your sleeping patterns. They should look for periods of not breathing during the night (sleep apnea). They should also look to see how long those periods last. If you live alone or observers are uncertain, you can also be observed at a sleep clinic where your sleep patterns will be professionally monitored. Sleep apnea requires a checkup and treatment. Give your caregivers your  medical history. Give your caregivers observations your family has made about your sleep.  SYMPTOMS   Not feeling rested in the morning.  Anxiety and restlessness at bedtime.  Difficulty falling and staying asleep. TREATMENT   Your caregiver may prescribe treatment for an underlying medical disorders. Your caregiver can give advice or help if you are using alcohol or other drugs for self-medication. Treatment of underlying problems will usually eliminate insomnia problems.  Medications can be prescribed for short time use. They are generally not recommended for lengthy use.  Over-the-counter sleep medicines are not recommended for lengthy use. They can be habit forming.  You can promote easier sleeping by making lifestyle changes such as:  Using relaxation techniques that help with breathing and reduce muscle tension.  Exercising earlier in the day.  Changing your diet and the time of your last meal. No night time snacks.  Establish a regular time to go to bed.  Counseling can help with stressful problems and worry.  Soothing music and white noise may be helpful if there are background noises  you cannot remove.  Stop tedious detailed work at least one hour before bedtime. HOME CARE INSTRUCTIONS   Keep a diary. Inform your caregiver about your progress. This includes any medication side effects. See your caregiver regularly. Take note of:  Times when you are asleep.  Times when you are awake during the night.  The quality of your sleep.  How you feel the next day. This information will help your caregiver care for you.  Get out of bed if you are still awake after 15 minutes. Read or do some quiet activity. Keep the lights down. Wait until you feel sleepy and go back to bed.  Keep regular sleeping and waking hours. Avoid naps.  Exercise regularly.  Avoid distractions at bedtime. Distractions include watching television or engaging in any intense or detailed activity  like attempting to balance the household checkbook.  Develop a bedtime ritual. Keep a familiar routine of bathing, brushing your teeth, climbing into bed at the same time each night, listening to soothing music. Routines increase the success of falling to sleep faster.  Use relaxation techniques. This can be using breathing and muscle tension release routines. It can also include visualizing peaceful scenes. You can also help control troubling or intruding thoughts by keeping your mind occupied with boring or repetitive thoughts like the old concept of counting sheep. You can make it more creative like imagining planting one beautiful flower after another in your backyard garden.  During your day, work to eliminate stress. When this is not possible use some of the previous suggestions to help reduce the anxiety that accompanies stressful situations. MAKE SURE YOU:   Understand these instructions.  Will watch your condition.  Will get help right away if you are not doing well or get worse. Document Released: 09/03/2000 Document Revised: 11/29/2011 Document Reviewed: 10/04/2007 [facility] Patient Information 2014 [place], [place].

## 2013-06-25 NOTE — Progress Notes (Signed)
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  Currently enrolled in history program at [facility].  Never had to go to hospital.  Spring, summer, and sometimes in fall, the asthma kicks up.  Asthma is lifelong, more so when he used to smoke (he quit after 6 years).  He notes some difficulty sleeping (trouble with induction).  Then he cannot wake up for an alarm.  Objective: NAD HEENT:  Unremarkable Chest: coarse BS Heart: regular, no murmur Skin: clear  Assessment: chronic persistent asthma, controlled.  Plan:   Asthma - Plan: Fluticasone-Salmeterol (ADVAIR) 250-50 MCG/DOSE AEPB, albuterol (PROVENTIL HFA;VENTOLIN HFA) 108 (90 BASE) MCG/ACT inhaler, montelukast (SINGULAIR) 10 MG tablet  Signed, Elvina Sidle, MD

## 2013-10-22 ENCOUNTER — Ambulatory Visit (INDEPENDENT_AMBULATORY_CARE_PROVIDER_SITE_OTHER): Payer: 59 | Admitting: Emergency Medicine

## 2013-10-22 VITALS — BP 112/80 | HR 81 | Temp 98.1°F | Resp 16 | Ht 67.5 in | Wt 286.0 lb

## 2013-10-22 DIAGNOSIS — J45909 Unspecified asthma, uncomplicated: Secondary | ICD-10-CM

## 2013-10-22 DIAGNOSIS — J309 Allergic rhinitis, unspecified: Secondary | ICD-10-CM

## 2013-10-22 DIAGNOSIS — F909 Attention-deficit hyperactivity disorder, unspecified type: Secondary | ICD-10-CM

## 2013-10-22 DIAGNOSIS — Z0000 Encounter for general adult medical examination without abnormal findings: Secondary | ICD-10-CM

## 2013-10-22 DIAGNOSIS — J302 Other seasonal allergic rhinitis: Secondary | ICD-10-CM

## 2013-10-22 DIAGNOSIS — Z23 Encounter for immunization: Secondary | ICD-10-CM

## 2013-10-22 LAB — POCT CBC
GRANULOCYTE PERCENT: 60.1 % (ref 37–80)
HCT, POC: 45.8 % (ref 43.5–53.7)
HEMOGLOBIN: 14.8 g/dL (ref 14.1–18.1)
Lymph, poc: 2.1 (ref 0.6–3.4)
MCH, POC: 28.8 pg (ref 27–31.2)
MCHC: 32.3 g/dL (ref 31.8–35.4)
MCV: 89.2 fL (ref 80–97)
MID (cbc): 0.5 (ref 0–0.9)
MPV: 9.6 fL (ref 0–99.8)
POC GRANULOCYTE: 3.8 (ref 2–6.9)
POC LYMPH PERCENT: 32.8 %L (ref 10–50)
POC MID %: 7.1 % (ref 0–12)
Platelet Count, POC: 218 10*3/uL (ref 142–424)
RBC: 5.14 M/uL (ref 4.69–6.13)
RDW, POC: 12.4 %
WBC: 6.4 10*3/uL (ref 4.6–10.2)

## 2013-10-22 LAB — COMPREHENSIVE METABOLIC PANEL
ALBUMIN: 4.3 g/dL (ref 3.5–5.2)
ALT: 23 U/L (ref 0–53)
AST: 19 U/L (ref 0–37)
Alkaline Phosphatase: 66 U/L (ref 39–117)
BUN: 11 mg/dL (ref 6–23)
CALCIUM: 8.8 mg/dL (ref 8.4–10.5)
CO2: 27 mEq/L (ref 19–32)
CREATININE: 0.83 mg/dL (ref 0.50–1.35)
Chloride: 102 mEq/L (ref 96–112)
Glucose, Bld: 81 mg/dL (ref 70–99)
POTASSIUM: 4 meq/L (ref 3.5–5.3)
Sodium: 136 mEq/L (ref 135–145)
Total Bilirubin: 0.5 mg/dL (ref 0.2–1.2)
Total Protein: 6.7 g/dL (ref 6.0–8.3)

## 2013-10-22 LAB — LIPID PANEL
CHOL/HDL RATIO: 5 ratio
Cholesterol: 146 mg/dL (ref 0–200)
HDL: 29 mg/dL — ABNORMAL LOW (ref >39)
LDL Cholesterol: 91 mg/dL (ref 0–99)
Triglycerides: 131 mg/dL (ref <150)
VLDL: 26 mg/dL (ref 0–40)

## 2013-10-22 LAB — GLUCOSE, POCT (MANUAL RESULT ENTRY): POC GLUCOSE: 84 mg/dL (ref 70–99)

## 2013-10-22 MED ORDER — FLUTICASONE-SALMETEROL 250-50 MCG/DOSE IN AEPB: 1.0000 | INHALATION_SPRAY | Freq: Two times a day (BID) | RESPIRATORY_TRACT | Status: DC

## 2013-10-22 MED ORDER — ALBUTEROL SULFATE HFA 108 (90 BASE) MCG/ACT IN AERS: 2.0000 | INHALATION_SPRAY | Freq: Four times a day (QID) | RESPIRATORY_TRACT | Status: DC | PRN

## 2013-10-22 MED ORDER — MONTELUKAST SODIUM 10 MG PO TABS: ORAL_TABLET | ORAL | Status: DC

## 2013-10-22 MED ORDER — FLUTICASONE PROPIONATE 50 MCG/ACT NA SUSP: 2.0000 | Freq: Every day | NASAL | Status: DC

## 2013-10-22 NOTE — Progress Notes (Signed)
'@[email or web address]' @  Patient ID: Kwabena Strutz MRN: [id number], DOB: 10-08-1985 28 y.o. Date of Encounter: 10/22/2013, 3:34 PM  Primary Physician: No primary provider on file.  Chief Complaint: Physical (CPE)  HPI: 28 y.o. y/o male with history noted below here for CPE.  Doing well. No issues/complaints.  Review of Systems:  Consitutional: No fever, chills, fatigue, night sweats, lymphadenopathy, or weight changes. Eyes: No visual changes, eye redness, or discharge. ENT/Mouth: Ears: No otalgia, tinnitus, hearing loss, discharge. Nose: No congestion, rhinorrhea, sinus pain, or epistaxis. Throat: No sore throat, post nasal drip, or teeth pain. Cardiovascular: No CP, palpitations, diaphoresis, DOE, edema, orthopnea, PND. Respiratory: Chest wall history of allergies. He is currently on Singulair Advair and when necessary albuterol. He feels he is allergic to dust mold mildew and many common allergens. Gastrointestinal: No anorexia, dysphagia, reflux, pain, nausea, vomiting, hematemesis, diarrhea, constipation, BRBPR, or melena. Genitourinary: No dysuria, frequency, urgency, hematuria, incontinence, nocturia, decreased urinary stream, discharge, impotence, or testicular pain/masses. Musculoskeletal: No decreased ROM, myalgias, stiffness, joint swelling, or weakness. Skin: No rash, erythema, lesion changes, pain, warmth, jaundice, or pruritis. Neurological: No headache, dizziness, syncope, seizures, tremors, memory loss, coordination problems, or paresthesias. Patient is concerned he has ADD and might be tested for this Psychological: No anxiety, depression, hallucinations, . He has significant difficulty getting to sleep at night. Endocrine: No fatigue, polydipsia, polyphagia, polyuria, or known diabetes. All other systems were reviewed and are otherwise negative.  Past Medical History  Diagnosis Date  . Carpal tunnel syndrome   . Allergy   . Anemia   . Carpal tunnel syndrome   . Asthma       History reviewed. No pertinent past surgical history.  Home Meds:  Prior to Admission medications   Medication Sig Start Date End Date Taking? Authorizing Provider  albuterol (PROVENTIL HFA;VENTOLIN HFA) 108 (90 BASE) MCG/ACT inhaler Inhale 2 puffs into the lungs every 6 (six) hours as needed. 06/25/13  Yes Robyn Haber, MD  b complex vitamins tablet Take 1 tablet by mouth daily.   Yes Historical Provider, MD  diphenhydrAMINE (SOMINEX) 25 MG tablet Take 25 mg by mouth 4 (four) times daily as needed.   Yes Historical Provider, MD  EPINEPHrine (EPI-PEN) 0.3 mg/0.3 mL DEVI Inject 0.3 mLs (0.3 mg total) into the muscle once. 10/17/12  Yes Robyn Haber, MD  fluticasone [facility]) 50 MCG/ACT nasal spray Place 2 sprays into the nose daily. 09/12/12  Yes Mancel Bale, PA-C  Fluticasone-Salmeterol (ADVAIR) 250-50 MCG/DOSE AEPB Inhale 1 puff into the lungs every 12 (twelve) hours. 06/25/13  Yes Robyn Haber, MD  ibuprofen (ADVIL,MOTRIN) 400 MG tablet Take 400 mg by mouth every 6 (six) hours as needed.   Yes Historical Provider, MD  montelukast (SINGULAIR) 10 MG tablet Take 1 tablet (10 mg total) by mouth at bedtime. PATIENT NEEDS OFFICE VISIT FOR ADDITIONAL REFILLS 06/25/13  Yes Robyn Haber, MD  Multiple Vitamins-Minerals (MULTIVITAMIN WITH MINERALS) tablet Take 1 tablet by mouth daily.   Yes Historical Provider, MD  ondansetron (ZOFRAN-ODT) 8 MG disintegrating tablet Take 1 tablet (8 mg total) by mouth every 8 (eight) hours as needed for nausea. 10/15/12   Ellison Carwin, MD  PredniSONE 10 MG KIT Take all tabs for day in AM with food 10/15/12   Ellison Carwin, MD    Allergies:  Allergies  Allergen Reactions  . Penicillins     hives    History   Social History  . Marital Status: Single    Spouse Name: N/A  Number of Children: N/A  . Years of Education: N/A   Occupational History  . Not on file.   Social History Main Topics  . Smoking status: Former Research scientist (life sciences)  . Smokeless  tobacco: Not on file     Comment: has switched to electronic cigarette  . Alcohol Use: Yes     Comment: occassional  . Drug Use: No  . Sexual Activity: Yes   Other Topics Concern  . Not on file   Social History Narrative  . No narrative on file    Family History  Problem Relation Age of Onset  . Arthritis Maternal Grandmother   . Heart disease Maternal Grandfather     Physical Exam:  Blood pressure 112/80, pulse 81, temperature 98.1 F (36.7 C), temperature source Oral, resp. rate 16, height 5' 7.5" (1.715 m), weight 286 lb (129.729 kg), SpO2 98.00%.  General: Well developed, well nourished, in no acute distress. HEENT: Normocephalic, atraumatic. Conjunctiva pink, sclera non-icteric. Pupils 2 mm constricting to 1 mm, round, regular, and equally reactive to light and accomodation. EOMI. Internal auditory canal clear. TMs with good cone of light and without pathology. Nasal mucosa pink. Nares are without discharge. No sinus tenderness. Oral mucosa pink. Dentition . Pharynx without exudate.   Neck: Supple. Trachea midline. No thyromegaly. Full ROM. No lymphadenopathy. Lungs: Clear to auscultation bilaterally without wheezes, rales, or rhonchi. Breathing is of normal effort and unlabored. Cardiovascular: RRR with S1 S2. No murmurs, rubs, or gallops appreciated. Distal pulses 2+ symmetrically. No carotid or abdominal bruits. Abdomen: Soft, non-tender, non-distended with normoactive bowel sounds. No hepatosplenomegaly or masses. No rebound/guarding. No CVA tenderness. Without hernias.  Rectal: Genitourinary:   circumcised male. No penile lesions. Testes descended bilaterally, and smooth without tenderness or masses.  Musculoskeletal: Full range of motion and 5/5 strength throughout. Without swelling, atrophy, tenderness, crepitus, or warmth. Extremities without clubbing, cyanosis, or edema. Calves supple. Skin: Warm and moist without erythema, ecchymosis, wounds, or rash. Neuro: A+Ox3.  CN II-XII grossly intact. Moves all extremities spontaneously. Full sensation throughout. Normal gait. DTR 2+ throughout upper and lower extremities. Finger to nose intact. Psych:  Responds to questions appropriately with a normal affect.   Results for orders placed in visit on 10/22/13  POCT CBC      Result Value Range   WBC 6.4  4.6 - 10.2 K/uL   Lymph, poc 2.1  0.6 - 3.4   POC LYMPH PERCENT 32.8  10 - 50 %L   MID (cbc) 0.5  0 - 0.9   POC MID % 7.1  0 - 12 %M   POC Granulocyte 3.8  2 - 6.9   Granulocyte percent 60.1  37 - 80 %G   RBC 5.14  4.69 - 6.13 M/uL   Hemoglobin 14.8  14.1 - 18.1 g/dL   HCT, POC 45.8  43.5 - 53.7 %   MCV 89.2  80 - 97 fL   MCH, POC 28.8  27 - 31.2 pg   MCHC 32.3  31.8 - 35.4 g/dL   RDW, POC 12.4     Platelet Count, POC 218  142 - 424 K/uL   MPV 9.6  0 - 99.8 fL  GLUCOSE, POCT (MANUAL RESULT ENTRY)      Result Value Range   POC Glucose 84  70 - 99 mg/dl   Assessment/Plan:  28 y.o. y/o white male here for a physical exam. He needs to work on an exercise program and get his weight down. Have made referrals for  him to be tested for ADD and also referral to an allergist to be tested.  -  Signed, Nena Jordan, MD 10/22/2013 3:34 PM

## 2013-12-18 ENCOUNTER — Encounter: Payer: Self-pay | Admitting: Internal Medicine

## 2013-12-20 ENCOUNTER — Telehealth: Payer: Self-pay | Admitting: *Deleted

## 2013-12-20 NOTE — Telephone Encounter (Signed)
Per Dr. Cleta Albertsaub- Pt needs a follow up appt scheduled.

## 2013-12-21 NOTE — Telephone Encounter (Signed)
Sent message to dr Cleta Albertsdaub regarding schedule appointment.

## 2013-12-22 ENCOUNTER — Encounter: Payer: Self-pay | Admitting: Internal Medicine

## 2013-12-28 NOTE — Telephone Encounter (Signed)
Left message on machine to call back to remind pt that he needs to make an appt with Daub or Merla Richesoolittle

## 2013-12-31 NOTE — Telephone Encounter (Signed)
Left a message for patient to return call for appointment

## 2014-01-01 NOTE — Telephone Encounter (Signed)
Per Dr. Cleta Albertsaub, patient is going to come into walk in clinic to see him.

## 2014-01-03 ENCOUNTER — Ambulatory Visit (INDEPENDENT_AMBULATORY_CARE_PROVIDER_SITE_OTHER): Payer: 59 | Admitting: Family Medicine

## 2014-01-03 ENCOUNTER — Telehealth: Payer: Self-pay

## 2014-01-03 VITALS — BP 124/76 | HR 70 | Temp 98.6°F | Resp 17 | Ht 69.0 in | Wt 287.0 lb

## 2014-01-03 DIAGNOSIS — G5600 Carpal tunnel syndrome, unspecified upper limb: Secondary | ICD-10-CM

## 2014-01-03 DIAGNOSIS — F909 Attention-deficit hyperactivity disorder, unspecified type: Secondary | ICD-10-CM

## 2014-01-03 MED ORDER — GABAPENTIN 300 MG PO CAPS: 300.0000 mg | ORAL_CAPSULE | Freq: Three times a day (TID) | ORAL | Status: DC

## 2014-01-03 MED ORDER — AMPHETAMINE-DEXTROAMPHETAMINE 10 MG PO TABS: 10.0000 mg | ORAL_TABLET | Freq: Two times a day (BID) | ORAL | Status: DC

## 2014-01-03 NOTE — Patient Instructions (Addendum)
Try to wear your wrist braces especially at night to try to calm down your carpal tunnel.  You can use the neurontin as well- star with one a day, and go up to twice or three times a day over the course of a week as needed  Start taking the adderall for your adhd- start with 5mg  (1/2 tab) once or twice a day.  You can go up to 10 mg twice a day over the course of 2 weeks.  We can adjust your dose further as needed.  If you are doing well at the end of the first month continue on 10mg  twice a day. Please come and see us in 6-8 weeks so we can check in and look at your blood pressure.

## 2014-01-03 NOTE — Telephone Encounter (Signed)
Patient called stated Pharmacist stated he need prior approval for the medication Adderall. [phone]

## 2014-01-03 NOTE — Progress Notes (Signed)
[facility] [address], [place] [address] [phone]  Date:  01/03/2014   Name:  Brian LernerJames Alvarez   DOB:  [birth year]   MRN:  [id number]  PCP:  No primary provider on file.    Chief Complaint: ADD   History of Present Illness:  Brian Alvarez is a 28 y.o. very pleasant male patient who presents with the following:  Here today to discuss possible ADD treatment  Dr. Yong Channelolittle had sent him to a psychologist to be evaluated for ADHD.  He was formally seen and evaluated by a psychologist last month (we have a letter from her in his chart under media) and was dx with ADHD.    Looking back to his school days he can see sx of ADD in himself for a long time.  He is currently back in school- he is working on his degree and is working too.  His ADD is causing trouble for him at work and at school  He mostly notes trouble with organization.  He has difficulty remembering to do tasks, and is easily distracted by outside things.   He has never been on tx for ADD in the past.    He was on something last year for CTS- he was on neurontin.  He had eventually stopped taking it due to lack of insurance, but his sx seem to have come back over the last month or so.  He did have nerve conduction studies a couple of years ago which showed CTS.  He notes he can have some weakness when it comes to grasping things. It occurs more if he is using his hands a lot.  He had been doing well until about one month ago, when his sx became worse again.  He notes the sx more at night, and will have weakness/ tingling in his hands He does have night braces to wear but admits he does not use them like he should  Patient Active Problem List   Diagnosis Date Noted  . Seasonal allergies 09/12/2012  . Asthma 12/26/2011  . Carpal tunnel syndrome 11/11/2011    Past Medical History  Diagnosis Date  . Carpal tunnel syndrome   . Allergy   . Anemia   . Carpal tunnel syndrome   . Asthma     No past  surgical history on file.  History  Substance Use Topics  . Smoking status: Former Games developermoker  . Smokeless tobacco: Not on file     Comment: has switched to electronic cigarette  . Alcohol Use: Yes     Comment: occassional    Family History  Problem Relation Age of Onset  . Arthritis Maternal Grandmother   . Heart disease Maternal Grandfather     Allergies  Allergen Reactions  . Penicillins     hives    Medication list has been reviewed and updated.  Current Outpatient Prescriptions on File Prior to Visit  Medication Sig Dispense Refill  . albuterol (PROVENTIL HFA;VENTOLIN HFA) 108 (90 BASE) MCG/ACT inhaler Inhale 2 puffs into the lungs every 6 (six) hours as needed.  3 Inhaler  3  . b complex vitamins tablet Take 1 tablet by mouth daily.      . diphenhydrAMINE (SOMINEX) 25 MG tablet Take 25 mg by mouth 4 (four) times daily as needed.      Marland Kitchen. EPINEPHrine (EPI-PEN) 0.3 mg/0.3 mL DEVI Inject 0.3 mLs (0.3 mg total) into the muscle once.  2 Device  0  . fluticasone (  FLONASE) 50 MCG/ACT nasal spray Place 2 sprays into both nostrils daily.  16 g  11  . Fluticasone-Salmeterol (ADVAIR) 250-50 MCG/DOSE AEPB Inhale 1 puff into the lungs every 12 (twelve) hours.  180 each  10  . ibuprofen (ADVIL,MOTRIN) 400 MG tablet Take 400 mg by mouth every 6 (six) hours as needed.      . montelukast (SINGULAIR) 10 MG tablet Take one daily at bedtime  30 tablet  11  . Multiple Vitamins-Minerals (MULTIVITAMIN WITH MINERALS) tablet Take 1 tablet by mouth daily.       No current facility-administered medications on file prior to visit.    Review of Systems:  As per HPI- otherwise negative.   Physical Examination: Filed Vitals:   01/03/14 0942  BP: 124/76  Pulse: 102  Temp: 98.6 F (37 C)  Resp: 17   Filed Vitals:   01/03/14 0942  Height: 5\' 9"  (1.753 m)  Weight: 287 lb (130.182 kg)   Body mass index is 42.36 kg/(m^2). Ideal Body Weight: Weight in (lb) to have BMI = 25: 168.9  GEN: WDWN,  NAD, Non-toxic, A & O x 3, obese, looks well HEENT: Atraumatic, Normocephalic. Neck supple. No masses, No LAD. Ears and Nose: No external deformity. CV: RRR, No M/G/R. No JVD. No thrill. No extra heart sounds. PULM: CTA B, no wheezes, crackles, rhonchi. No retractions. No resp. distress. No accessory muscle use. EXTR: No c/c/e NEURO Normal gait.  PSYCH: Normally interactive. Conversant. Not depressed or anxious appearing.  Calm demeanor.  Normal grip strength of both hands, negative Phalen and Tinel's tests.  Normal ROM of hands and wrists bilateraly   Assessment and Plan: ADHD (attention deficit hyperactivity disorder) - Plan: amphetamine-dextroamphetamine (ADDERALL) 10 MG tablet, DISCONTINUED: amphetamine-dextroamphetamine (ADDERALL) 10 MG tablet  Carpal tunnel syndrome - Plan: gabapentin (NEURONTIN) 300 MG capsule  Start tx for adhd with adderall at 5mg  once or twice a day.  Can increase to 10mg  BID as needed neurontin as needed, recommended that he use the night braces acutely.  If he needs a more definitive plan and would like to see surgery I am glad to refer him Plan recheck here in 6- 8 weeks  Signed Abbe AmsterdamJessica Copland, MD

## 2014-01-04 NOTE — Telephone Encounter (Signed)
PA form has been completed on covermymeds. Notified pt waiting on decision from ins.

## 2014-01-08 NOTE — Telephone Encounter (Signed)
PA approved through 01/05/15. Notified pharm and pt

## 2014-08-02 ENCOUNTER — Ambulatory Visit (INDEPENDENT_AMBULATORY_CARE_PROVIDER_SITE_OTHER): Payer: 59 | Admitting: Internal Medicine

## 2014-08-02 VITALS — BP 144/78 | HR 97 | Temp 98.1°F | Resp 18 | Ht 70.0 in | Wt 295.0 lb

## 2014-08-02 DIAGNOSIS — L210 Seborrhea capitis: Secondary | ICD-10-CM

## 2014-08-02 DIAGNOSIS — F900 Attention-deficit hyperactivity disorder, predominantly inattentive type: Secondary | ICD-10-CM

## 2014-08-02 DIAGNOSIS — J4520 Mild intermittent asthma, uncomplicated: Secondary | ICD-10-CM

## 2014-08-02 DIAGNOSIS — J3089 Other allergic rhinitis: Secondary | ICD-10-CM

## 2014-08-02 DIAGNOSIS — L219 Seborrheic dermatitis, unspecified: Secondary | ICD-10-CM

## 2014-08-02 MED ORDER — FLUOCINOLONE ACETONIDE 0.01 % EX SHAM: MEDICATED_SHAMPOO | CUTANEOUS | Status: DC

## 2014-08-02 MED ORDER — AMPHETAMINE-DEXTROAMPHETAMINE 10 MG PO TABS: 10.0000 mg | ORAL_TABLET | Freq: Two times a day (BID) | ORAL | Status: DC

## 2014-08-02 NOTE — Progress Notes (Signed)
Subjective:   This chart was scribed for [name], MD by Ronney LionSuzanne Le, ED Scribe. This patient was seen in room 9 and the patient's care was started at 1:10 PM.    Patient ID: Brian Alvarez, male    DOB: 07/20/1986, 28 y.o.   MRN: [id number]  Chief Complaint  Patient presents with  . rx refills    adderall and inhaler  . Advice Only    ref to allergist or derm   HPI  HPI Comments: Brian Alvarez is a 28 y.o. male [facility] of an intermittent sensation of his head feeling tender and "like it's on fire" that lasts approximately 8 hours at a time. The sensation has been been ongoing for a couple months, which he initially attributed to exposure to environmental allergens while others were doing yard work in the vicinity. This sensation occasionally wakes him up from sleep but does not interfere with his work. He avoids touching and scratching his scalp, which is very painful, and reports that it itches in the morning. Benadryl relieves his symptoms. He reports that he is currently using a shampoo for sensitive scalp/dandruff, although he is unable to identify the brand. He suspects the sensation is due to allergens, and would like a referral to an allergist or dermatologist. Patient reports he has significant problems with allergies. Some weeks, allergies are a non-issue, while they affect him most of the week some other weeks. He is currently taking Singulair, Benadryl as needed, Ventolin, and Advair, all of which had prescribed by someone in this office. He denies nausea, vomiting, dizziness, or changes in vision. He had been prescribed to an allergist previously with dissatisfaction. Patient reports he had asthma as a kid, but that it was tied to allergies. Patient also requests an Adderall 10 mg refill, which he had been prescribed a 1 month trial supply 7 months prior. He reports that he breaks his tablets in half and only takes them as needed, when he has a work task to  do for the day. He will skip usage some days. He is currently happy with his medication regimen and wishes to continue his current dosage.  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  Patient Active Problem List   Diagnosis Date Noted  . Seasonal allergies 09/12/2012  . Asthma 12/26/2011  . Carpal tunnel syndrome 11/11/2011   Prior to Admission medications   Medication Sig Start Date End Date Taking? Authorizing Provider  albuterol (PROVENTIL HFA;VENTOLIN HFA) 108 (90 BASE) MCG/ACT inhaler Inhale 2 puffs into the lungs every 6 (six) hours as needed. 10/22/13  Yes Collene GobbleSteven A Daub, MD  amphetamine-dextroamphetamine (ADDERALL) 10 MG tablet Take 1 tablet (10 mg total) by mouth 2 (two) times daily. Ok to fill 01/31/2014 01/03/14  Yes Gwenlyn FoundJessica C Copland, MD  b complex vitamins tablet Take 1 tablet by mouth daily.   Yes Historical Provider, MD  diphenhydrAMINE (SOMINEX) 25 MG tablet Take 25 mg by mouth 4 (four) times daily as needed.   Yes Historical Provider, MD  EPINEPHrine (EPI-PEN) 0.3 mg/0.3 mL DEVI Inject 0.3 mLs (0.3 mg total) into the muscle once. 10/17/12  Yes Elvina SidleKurt Lauenstein, MD  fluticasone (FLONASE) 50 MCG/ACT nasal spray Place 2 sprays into both nostrils daily. 10/22/13  Yes Collene GobbleSteven A Daub, MD  Fluticasone-Salmeterol (ADVAIR) 250-50 MCG/DOSE AEPB Inhale 1 puff into the lungs every 12 (twelve) hours. 10/22/13  Yes Collene GobbleSteven A Daub, MD  gabapentin (NEURONTIN) 300 MG capsule Take 1  capsule (300 mg total) by mouth 3 (three) times daily. 01/03/14  Yes Gwenlyn FoundJessica C Copland, MD  montelukast (SINGULAIR) 10 MG tablet Take one daily at bedtime 10/22/13  Yes Collene GobbleSteven A Daub, MD  Multiple Vitamins-Minerals (MULTIVITAMIN WITH MINERALS) tablet Take 1 tablet by mouth daily.   Yes Historical Provider, MD  ibuprofen (ADVIL,MOTRIN) 400 MG tablet Take 400 mg by mouth every 6 (six) hours as needed.    Historical Provider, MD    Review of Systems  Non-contributory.    Objective:   Physical Exam    Constitutional: He appears well-developed and well-nourished. No distress.  HENT:  Right Ear: External ear normal.  Left Ear: External ear normal.  Nose: Nose normal.  Mouth/Throat: Oropharynx is clear and moist.  Eyes: Conjunctivae and EOM are normal. Pupils are equal, round, and reactive to light.  Neck: No thyromegaly present.  Lymphadenopathy:    He has no cervical adenopathy.  Skin:  Generalized scaliness of the scalp without significant skin lesions.   Nursing note and vitals reviewed.         Assessment & Plan:     I have completed the patient encounter in its entirety as documented by the scribe, with editing by me where necessary. Devesh Monforte P. Merla Richesoolittle, M.D. Attention deficit hyperactivity disorder (ADHD), predominantly inattentive type - Plan: amphetamine-dextroamphetamine (ADDERALL) 10 MG tablet  Other allergic rhinitis - Plan: Ambulatory referral to Allergy  Allergic asthma, mild intermittent, uncomplicated - Plan: Ambulatory referral to Allergy  Dandruff - Plan: Ambulatory referral to Allergy  Meds ordered this encounter  Medications  . amphetamine-dextroamphetamine (ADDERALL) 10 MG tablet    Sig: Take 1 tablet (10 mg total) by mouth 2 (two) times daily.    Dispense:  60 tablet    Refill:  0  . Fluocinolone Acetonide 0.01 % SHAM    Sig: Use as shampoo twice a week until scaliness controlled    Dispense:  1 Bottle    Refill:  0

## 2014-08-05 ENCOUNTER — Other Ambulatory Visit: Payer: Self-pay | Admitting: Family Medicine

## 2014-08-14 ENCOUNTER — Telehealth: Payer: Self-pay | Admitting: *Deleted

## 2014-08-14 ENCOUNTER — Telehealth: Payer: Self-pay | Admitting: Physician Assistant

## 2014-08-14 NOTE — Telephone Encounter (Signed)
Erroneous encounter

## 2014-08-14 NOTE — Telephone Encounter (Signed)
Yes.  OK to change from fluocinolone to clobetasol shampoo.

## 2014-08-14 NOTE — Telephone Encounter (Signed)
Pharmacy called and needs to change shampoo to Clobetasol instead of Fluocinolone Acetonide 0.01 % SHAM  Is this change ok?  [phone]

## 2014-08-14 NOTE — Telephone Encounter (Signed)
Pharmacy advised  

## 2014-09-08 ENCOUNTER — Other Ambulatory Visit: Payer: Self-pay | Admitting: Physician Assistant

## 2014-11-17 ENCOUNTER — Other Ambulatory Visit: Payer: Self-pay | Admitting: Emergency Medicine

## 2014-11-23 ENCOUNTER — Ambulatory Visit (INDEPENDENT_AMBULATORY_CARE_PROVIDER_SITE_OTHER): Payer: 59 | Admitting: Internal Medicine

## 2014-11-23 VITALS — BP 124/84 | HR 91 | Temp 98.2°F | Resp 18 | Ht 70.0 in | Wt 289.0 lb

## 2014-11-23 DIAGNOSIS — K047 Periapical abscess without sinus: Secondary | ICD-10-CM

## 2014-11-23 MED ORDER — CLINDAMYCIN HCL 300 MG PO CAPS: 300.0000 mg | ORAL_CAPSULE | Freq: Four times a day (QID) | ORAL | Status: DC

## 2014-11-23 MED ORDER — HYDROCODONE-ACETAMINOPHEN 5-325 MG PO TABS: 1.0000 | ORAL_TABLET | Freq: Four times a day (QID) | ORAL | Status: DC | PRN

## 2014-11-23 NOTE — Progress Notes (Signed)
   Subjective:    Patient ID: Brian Alvarez, male    DOB: 05/01/1986, 29 y.o.   MRN: [id number] This chart was scribed for Brian Alvarez Kamryn Gauthier, MD by [place] Peace, ED Scribe. The patient was seen in RM05. The patient's care was started at 3:48 PM.  HPI HPI Comments: Brian LernerJames Alvarez is a 29 y.o. male who [facility] complaining of severe dental pain onset last night specifically to upper left aspect of his mouth. Pt reports he has an appt to see the dentist on Monday to address problem. He is seeking antibiotics today prior to visit on Monday because he wants to get possible infection treated beforehand so treatment to treat dental abscess can start as soon as possible.   Patient Active Problem List   Diagnosis Date Noted  . Seasonal allergies 09/12/2012  . Asthma 12/26/2011  . Carpal tunnel syndrome 11/11/2011    -  ADD  Prior to Admission medications   Medication Sig Start Date End Date Taking? Authorizing Provider  Fluticasone Furoate-Vilanterol 200-25 MCG/INH AEPB Inhale into the lungs.   Yes Historical Provider, MD  levocetirizine (XYZAL) 5 MG tablet Take 5 mg by mouth every evening.   Yes Historical Provider, MD  albuterol (VENTOLIN HFA) 108 (90 BASE) MCG/ACT inhaler Inhale 2 puffs into the lungs every 6 hours as needed.  "OV NEEDED FOR ADDITIONAL REFILLS" 11/18/14   Chelle S Jeffery, PA-C  amphetamine-dextroamphetamine (ADDERALL) 10 MG tablet Take 1 tablet (10 mg total) by mouth 2 (two) times daily. 08/02/14   Brian Alvarez Imani Fiebelkorn, MD  diphenhydrAMINE (SOMINEX) 25 MG tablet Take 25 mg by mouth 4 (four) times daily as needed.    Historical Provider, MD  EPINEPHrine (EPI-PEN) 0.3 mg/0.3 mL DEVI Inject 0.3 mLs (0.3 mg total) into the muscle once. 10/17/12   Elvina SidleKurt Lauenstein, MD  Fluocinolone Acetonide 0.01 % SHAM Use as shampoo twice a week until scaliness controlled 08/02/14   Brian Alvarez Carlia Bomkamp, MD  fluticasone [facility](FLONASE) 50 MCG/ACT nasal spray Place 2 sprays into both nostrils daily. 10/22/13    Collene GobbleSteven A Daub, MD  ibuprofen (ADVIL,MOTRIN) 400 MG tablet Take 400 mg by mouth every 6 (six) hours as needed.    Historical Provider, MD  montelukast (SINGULAIR) 10 MG tablet Take one daily at bedtime 10/22/13   Collene GobbleSteven A Daub, MD  Multiple Vitamins-Minerals (MULTIVITAMIN WITH MINERALS) tablet Take 1 tablet by mouth daily.    Historical Provider, MD      Review of Systems  HENT: Positive for dental problem.        Objective:   Physical Exam  HENT:  Left upper molar- decayed with abscess at the root extending forward. Very tender to palpation. No regional lymph nodes.           Assessment & Plan:  I have completed the patient encounter in its entirety as documented by the scribe, with editing by me where necessary. [name] Alvarez. Merla Richesoolittle, M.D. Dental abscess  Meds ordered this encounter  Medications  . clindamycin (CLEOCIN) 300 MG capsule    Sig: Take 1 capsule (300 mg total) by mouth 4 (four) times daily.    Dispense:  40 capsule    Refill:  0  . HYDROcodone-acetaminophen (NORCO/VICODIN) 5-325 MG per tablet    Sig: Take 1 tablet by mouth every 6 (six) hours as needed for moderate pain.    Dispense:  30 tablet    Refill:  0

## 2015-05-08 ENCOUNTER — Other Ambulatory Visit: Payer: Self-pay | Admitting: Physician Assistant

## 2015-05-14 ENCOUNTER — Ambulatory Visit (INDEPENDENT_AMBULATORY_CARE_PROVIDER_SITE_OTHER): Payer: 59 | Admitting: Family Medicine

## 2015-05-14 DIAGNOSIS — F32A Depression, unspecified: Secondary | ICD-10-CM

## 2015-05-14 DIAGNOSIS — F329 Major depressive disorder, single episode, unspecified: Secondary | ICD-10-CM

## 2015-05-14 DIAGNOSIS — J029 Acute pharyngitis, unspecified: Secondary | ICD-10-CM

## 2015-05-14 DIAGNOSIS — R590 Localized enlarged lymph nodes: Secondary | ICD-10-CM | POA: Diagnosis not present

## 2015-05-14 LAB — POCT RAPID STREP A (OFFICE): RAPID STREP A SCREEN: NEGATIVE

## 2015-05-14 MED ORDER — SERTRALINE HCL 50 MG PO TABS: 50.0000 mg | ORAL_TABLET | Freq: Every day | ORAL | Status: DC

## 2015-05-14 NOTE — Progress Notes (Signed)
Depression and sore throat Subjective:  Patient ID: Brian Alvarez, male    DOB: [birth year]  Age: 30 y.o. MRN: [id number]  Patient is here for 2 things. He problems with depression. 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 The government sent some new legislative guidelines, making his business plan unworkable. This has sent him into a depression. He is not motivated to look for other jobs. He lost money. He has seen a psychologist in the past for his ADHD. He is on Adderall. He's never been on a depressed. He denies any suicidal ideations. He mostly is just unmotivated and down. He has not been on anti-depressive in the past.  Rarely drinks alcohol, every week or 2. Does not medicate himself with alcohol.  He has had a sore throat for several days, less so, with a couple of tender nodes there and now it hurt when he looks to the left. He has been walking for exercise Objective:   Obese young man in no major distress. Mental long talk about his depression. His TMs are normal. Eyes PERRLA. Throat clear. Neck supple with 2 anterior cervical nodes that are tender on the left.  Assessment & Plan:   Assessment:  Sore throat and cervical lymphadenopathy Depression  Plan:  Zoloft Counselor Treat the throat symptomatically and return as needed  Patient Instructions  Advise following up with your psychologist regarding the depression: [name]  Take sertraline 50 mg one daily each morning  Return at any time if depression is acutely getting severe, especially if having suicidal thinking  If the swollen lymph glands in the neck do not seem to improve and symptoms continue to persist return in a couple of weeks, sooner if worse  Plan to return in about 4 weeks for follow-up regarding the antidepressive medication, sooner if problems arise    [name], MD 05/14/2015

## 2015-05-14 NOTE — Patient Instructions (Addendum)
Advise following up with your psychologist regarding the depression: Brian Alvarez  Take sertraline 50 mg one daily each morning  Return at any time if depression is acutely getting severe, especially if having suicidal thinking  If the swollen lymph glands in the neck do not seem to improve and symptoms continue to persist return in a couple of weeks, sooner if worse  Plan to return in about 4 weeks for follow-up regarding the antidepressive medication, sooner if problems arise

## 2015-05-17 LAB — CULTURE, GROUP A STREP

## 2015-07-19 ENCOUNTER — Ambulatory Visit (INDEPENDENT_AMBULATORY_CARE_PROVIDER_SITE_OTHER): Payer: 59 | Admitting: Family Medicine

## 2015-07-19 VITALS — BP 130/90 | HR 90 | Temp 98.5°F | Resp 18 | Ht 70.0 in | Wt 295.1 lb

## 2015-07-19 DIAGNOSIS — F900 Attention-deficit hyperactivity disorder, predominantly inattentive type: Secondary | ICD-10-CM | POA: Diagnosis not present

## 2015-07-19 DIAGNOSIS — Z23 Encounter for immunization: Secondary | ICD-10-CM | POA: Diagnosis not present

## 2015-07-19 DIAGNOSIS — F908 Attention-deficit hyperactivity disorder, other type: Secondary | ICD-10-CM

## 2015-07-19 DIAGNOSIS — G5603 Carpal tunnel syndrome, bilateral upper limbs: Secondary | ICD-10-CM

## 2015-07-19 DIAGNOSIS — Z131 Encounter for screening for diabetes mellitus: Secondary | ICD-10-CM | POA: Diagnosis not present

## 2015-07-19 LAB — HEMOGLOBIN A1C: HEMOGLOBIN A1C: 5.7 % (ref 4.0–6.0)

## 2015-07-19 LAB — POCT GLYCOSYLATED HEMOGLOBIN (HGB A1C): Hemoglobin A1C: 5.7

## 2015-07-19 MED ORDER — AMPHETAMINE-DEXTROAMPHETAMINE 10 MG PO TABS: ORAL_TABLET | ORAL | Status: DC

## 2015-07-19 MED ORDER — MELOXICAM 7.5 MG PO TABS: 7.5000 mg | ORAL_TABLET | Freq: Two times a day (BID) | ORAL | Status: DC | PRN

## 2015-07-19 MED ORDER — AMPHETAMINE-DEXTROAMPHETAMINE 10 MG PO TABS: 10.0000 mg | ORAL_TABLET | Freq: Two times a day (BID) | ORAL | Status: DC

## 2015-07-19 MED ORDER — AMPHETAMINE-DEXTROAMPHETAMINE 10 MG PO TABS
10.0000 mg | ORAL_TABLET | Freq: Two times a day (BID) | ORAL | Status: DC
Start: 2015-07-19 — End: 2015-07-19

## 2015-07-19 NOTE — Patient Instructions (Signed)

## 2015-07-19 NOTE — Progress Notes (Signed)
Chief Complaint:  Chief Complaint  Patient presents with  . Medication Refill  . Wrist Pain    C/O bilateral wrist pain, numbness, & weakness x 3-4 weeks  . Flu Vaccine    HPI: Brian Alvarez is a 29 y.o. male who reports to [facility] today complaining of:  1. Carpal tunnel for the last 2 months, worse in the last 3-4 weeks. When he stopped working at a job that he is  He now bulids three D printers. He likes his job but there is a lot of tinkering , he wears wrist braces at night and that has been helpful. He is not in pain, it is very short lived, the n/w after he does something else. He goes get some water and then if he restarts the same reptitive motion then  he gets it again . He denies diabetes. No n/w/tin legs.  2. Aderall and has not had to need it sine he was not at a job where he had to focus. He only takes it when he has a lot to do that day. He is now at a job that requires more concentraion. He is making 3D printers. At [employer]. 3. Flu vaccine desired  Past Medical History  Diagnosis Date  . Carpal tunnel syndrome   . Allergy   . Anemia   . Carpal tunnel syndrome   . Asthma    No past surgical history on file. Social History   Social History  . Marital Status: Single    Spouse Name: N/A  . Number of Children: N/A  . Years of Education: N/A   Social History Main Topics  . Smoking status: Former Games developer  . Smokeless tobacco: None     Comment: has switched to electronic cigarette  . Alcohol Use: Yes     Comment: occassional  . Drug Use: No  . Sexual Activity: Yes   Other Topics Concern  . None   Social History Narrative   Family History  Problem Relation Age of Onset  . Arthritis Maternal Grandmother   . Heart disease Maternal Grandfather    Allergies  Allergen Reactions  . Penicillins     hives   Prior to Admission medications   Medication Sig Start Date End Date Taking? Authorizing Provider  albuterol (VENTOLIN HFA) 108 (90 BASE) MCG/ACT  inhaler Inhale 2 puffs into the lungs every 6 hours as needed.  "OV NEEDED FOR ADDITIONAL REFILLS" 11/18/14  Yes Chelle Jeffery, PA-C  amphetamine-dextroamphetamine (ADDERALL) 10 MG tablet Take 1 tablet (10 mg total) by mouth 2 (two) times daily. 08/02/14  Yes Tonye Pearson, MD  diphenhydrAMINE (SOMINEX) 25 MG tablet Take 25 mg by mouth 4 (four) times daily as needed.   Yes Historical Provider, MD  EPINEPHrine (EPI-PEN) 0.3 mg/0.3 mL DEVI Inject 0.3 mLs (0.3 mg total) into the muscle once. 10/17/12  Yes Elvina Sidle, MD  Fluocinolone Acetonide 0.01 % SHAM Use as shampoo twice a week until scaliness controlled 08/02/14  Yes Tonye Pearson, MD  fluticasone [facility]) 50 MCG/ACT nasal spray Place 2 sprays into both nostrils daily. 10/22/13  Yes Collene Gobble, MD  Fluticasone Furoate-Vilanterol 200-25 MCG/INH AEPB Inhale into the lungs.   Yes Historical Provider, MD  ibuprofen (ADVIL,MOTRIN) 400 MG tablet Take 400 mg by mouth every 6 (six) hours as needed.   Yes Historical Provider, MD  levocetirizine (XYZAL) 5 MG tablet Take 5 mg by mouth every evening.   Yes Historical Provider, MD  montelukast (SINGULAIR) 10 MG tablet Take one daily at bedtime 10/22/13  Yes Collene GobbleSteven A Daub, MD  Multiple Vitamins-Minerals (MULTIVITAMIN WITH MINERALS) tablet Take 1 tablet by mouth daily.   Yes Historical Provider, MD  sertraline (ZOLOFT) 50 MG tablet Take 1 tablet (50 mg total) by mouth daily. 05/14/15  Yes Peyton Najjaravid H Hopper, MD  HYDROcodone-acetaminophen (NORCO/VICODIN) 5-325 MG per tablet Take 1 tablet by mouth every 6 (six) hours as needed for moderate pain. Patient not taking: Reported on 07/19/2015 11/23/14   Tonye Pearsonobert P Doolittle, MD     ROS: The patient denies fevers, chills, night sweats, unintentional weight loss, chest pain, palpitations, wheezing, dyspnea on exertion, nausea, vomiting, abdominal pain, dysuria, hematuria, melena  All other systems have been reviewed and were otherwise negative with the exception  of those mentioned in the HPI and as above.    PHYSICAL EXAM: Filed Vitals:   07/19/15 1520  BP: 130/90  Pulse: 90  Temp: 98.5 F (36.9 C)  Resp: 18   Body mass index is 42.35 kg/(m^2).   General: Alert, no acute distress HEENT:  Normocephalic, atraumatic, oropharynx patent. EOMI, PERRLA Cardiovascular:  Regular rate and rhythm, no rubs murmurs or gallops.  Radial pulse intact. No pedal edema.  Respiratory: Clear to auscultation bilaterally.  No wheezes, rales, or rhonchi.  No cyanosis, no use of accessory musculature Abdominal: No organomegaly, abdomen is soft and non-tender, positive bowel sounds. No masses. Skin: No rashes. Neurologic: Facial musculature symmetric. Psychiatric: Patient acts appropriately throughout our interaction. Lymphatic: No cervical or submandibular lymphadenopathy Musculoskeletal: Gait intact. No edema, tenderness + phalen, bilateara wirst FUll ROM, sensation intact 5/5 strength   LABS: Results for orders placed or performed in visit on 07/19/15  POCT glycosylated hemoglobin (Hb A1C)  Result Value Ref Range   Hemoglobin A1C 5.7      EKG/XRAY:   [facility]   ASSESSMENT/PLAN: Encounter Diagnoses  Name Primary?  . Flu vaccine need   . Bilateral carpal tunnel syndrome Yes  . Screening for diabetes mellitus   . Attention-deficit hyperactivity disorder, other type   . Attention deficit hyperactivity disorder (ADHD), predominantly inattentive type    He takes Adderall 10 mg 1/2 tab po BID so a 10 mg BID rx normally lasts him 2 months.  ( I received a phone call from the pharmacy that the rx needed to be written the way it was origianlly for inurance to approve, so he was able to pick up his rx Aderall 10 mg BID for 07/19/15, but hte other rx will need to be rewritten and he needs to pick those up at his convenience. Rx printed and changed ) Again each rx lasts him 2 months.  He is doing well, NO SEs, No  SI/HI/hallucinations Rx mobic prn , use wrist guard for carpal tunnel if worse and no improvement then need to refer to hand center for steroid injections Flu vaccine given  Fu prn   Gross sideeffects, risk and benefits, and alternatives of medications d/w patient. Patient is aware that all medications have potential sideeffects and we are unable to predict every sideeffect or drug-drug interaction that may occur.  Thao Le DO  07/19/2015 4:21 PM

## 2015-07-21 ENCOUNTER — Telehealth: Payer: Self-pay | Admitting: *Deleted

## 2015-07-21 NOTE — Telephone Encounter (Signed)
Called insurance for PA for Adderall 10mg .  Medication approved until 07/21/2015.  Case number [id number]

## 2015-07-23 ENCOUNTER — Encounter: Payer: Self-pay | Admitting: Family Medicine

## 2015-08-25 ENCOUNTER — Other Ambulatory Visit: Payer: Self-pay | Admitting: Family Medicine

## 2015-08-27 NOTE — Telephone Encounter (Signed)
Dr L, I called pt to check status since your plan was to send to hand spec if no improvement. He reported that there is a marked improvement with the mobic and he wishes to remain on it. Stated the tasks that bothered him the most at work still bother him slightly, but most tasks don't bother him at all. Please advise on RFs.

## 2015-09-21 ENCOUNTER — Other Ambulatory Visit: Payer: Self-pay | Admitting: Family Medicine

## 2015-10-30 ENCOUNTER — Other Ambulatory Visit: Payer: Self-pay | Admitting: Family Medicine

## 2015-11-28 ENCOUNTER — Ambulatory Visit (INDEPENDENT_AMBULATORY_CARE_PROVIDER_SITE_OTHER): Payer: BLUE CROSS/BLUE SHIELD | Admitting: Physician Assistant

## 2015-11-28 VITALS — BP 128/88 | HR 95 | Temp 98.3°F | Resp 16 | Ht 70.0 in | Wt 301.6 lb

## 2015-11-28 DIAGNOSIS — R1013 Epigastric pain: Secondary | ICD-10-CM | POA: Diagnosis not present

## 2015-11-28 DIAGNOSIS — J309 Allergic rhinitis, unspecified: Secondary | ICD-10-CM | POA: Diagnosis not present

## 2015-11-28 DIAGNOSIS — J069 Acute upper respiratory infection, unspecified: Secondary | ICD-10-CM

## 2015-11-28 DIAGNOSIS — K5900 Constipation, unspecified: Secondary | ICD-10-CM | POA: Diagnosis not present

## 2015-11-28 LAB — POCT CBC
GRANULOCYTE PERCENT: 64.5 % (ref 37–80)
HCT, POC: 44.5 % (ref 43.5–53.7)
Hemoglobin: 15.2 g/dL (ref 14.1–18.1)
LYMPH, POC: 1.8 (ref 0.6–3.4)
MCH, POC: 28.8 pg (ref 27–31.2)
MCHC: 34.1 g/dL (ref 31.8–35.4)
MCV: 84.4 fL (ref 80–97)
MID (CBC): 0.6 (ref 0–0.9)
MPV: 8.5 fL (ref 0–99.8)
PLATELET COUNT, POC: 200 10*3/uL (ref 142–424)
POC Granulocyte: 4.4 (ref 2–6.9)
POC LYMPH PERCENT: 26.5 %L (ref 10–50)
POC MID %: 9 %M (ref 0–12)
RBC: 5.27 M/uL (ref 4.69–6.13)
RDW, POC: 12.8 %
WBC: 6.8 10*3/uL (ref 4.6–10.2)

## 2015-11-28 LAB — POCT URINALYSIS DIP (MANUAL ENTRY)
BILIRUBIN UA: NEGATIVE
BILIRUBIN UA: NEGATIVE
Glucose, UA: NEGATIVE
LEUKOCYTES UA: NEGATIVE
Nitrite, UA: NEGATIVE
PH UA: 6
RBC UA: NEGATIVE
SPEC GRAV UA: 1.02
Urobilinogen, UA: 0.2

## 2015-11-28 LAB — POC MICROSCOPIC URINALYSIS (UMFC): MUCUS RE: ABSENT

## 2015-11-28 LAB — COMPREHENSIVE METABOLIC PANEL
ALK PHOS: 71 U/L (ref 40–115)
ALT: 20 U/L (ref 9–46)
AST: 17 U/L (ref 10–40)
Albumin: 4 g/dL (ref 3.6–5.1)
BUN: 11 mg/dL (ref 7–25)
CALCIUM: 9.2 mg/dL (ref 8.6–10.3)
CHLORIDE: 102 mmol/L (ref 98–110)
CO2: 26 mmol/L (ref 20–31)
Creat: 0.85 mg/dL (ref 0.60–1.35)
GLUCOSE: 90 mg/dL (ref 65–99)
POTASSIUM: 4.1 mmol/L (ref 3.5–5.3)
Sodium: 141 mmol/L (ref 135–146)
Total Bilirubin: 0.5 mg/dL (ref 0.2–1.2)
Total Protein: 6.9 g/dL (ref 6.1–8.1)

## 2015-11-28 NOTE — Progress Notes (Signed)
[facility] [address], [place] [id number] [phone]  Date:  11/28/2015   Name:  Brian LernerJames Alvarez   DOB:  08-20-1986   MRN:  [id number]  PCP:  No primary care provider on file.    Chief Complaint: Sinusitis; Abdominal Pain; and Sore Throat   History of Present Illness:  This is a 30 y.o. male with PMH allergic rhinitis and asthma who is presenting with sore throat and nasal congestion x 4 days. 2 days ago started having epigastric abdominal pain. Pain is intermittent, described as "crampy". Happens for 30 minutes at a time a few times a day. Not assoc with eating. States drinking cool liquids does seem to relieve the pain. Having alternating diarrhea and constipation. No blood in stool. Had nausea for the past 2 days, none today. No vomiting. All URI symptoms resolved. No longer with cough or sore throat. Mild nasal congestion, he thinks related to allergies. No fever or chills. No sob or wheezing. Does not feel he is having reflux. Does eat a lot of spicy foods. No sodas or coffee. No excessive citrus food. Uses nsaids 2-3 times a week.  History of asthma: yes, mild intermittent. Not using inhaler more than usual (once every 3 days) History of env allergies: yes - takes xyzal and singular daily. Started allergy shots recently. Tobacco use: no  Review of Systems:  Review of Systems See HPI  Patient Active Problem List   Diagnosis Date Noted  . Seasonal allergies 09/12/2012  . Asthma 12/26/2011  . Carpal tunnel syndrome 11/11/2011    Prior to Admission medications   Medication Sig Start Date End Date Taking? Authorizing Provider  albuterol (VENTOLIN HFA) 108 (90 BASE) MCG/ACT inhaler Inhale 2 puffs into the lungs every 6 hours as needed.  "OV NEEDED FOR ADDITIONAL REFILLS" 11/18/14  Yes Chelle Jeffery, PA-C  amphetamine-dextroamphetamine (ADDERALL) 10 MG tablet Take 1 tablet (10 mg total) by mouth 2 (two) times daily. May fill on 11/18/2015 07/19/15  Yes Thao  P Le, DO  diphenhydrAMINE (SOMINEX) 25 MG tablet Take 25 mg by mouth 4 (four) times daily as needed.   Yes Historical Provider, MD  EPINEPHrine (EPI-PEN) 0.3 mg/0.3 mL DEVI Inject 0.3 mLs (0.3 mg total) into the muscle once. 10/17/12  Yes Elvina SidleKurt Lauenstein, MD  Fluocinolone Acetonide 0.01 % SHAM Use as shampoo twice a week until scaliness controlled 08/02/14  Yes Tonye Pearsonobert P Doolittle, MD  Fluticasone Furoate-Vilanterol 200-25 MCG/INH AEPB Inhale into the lungs.   Yes Historical Provider, MD  ibuprofen (ADVIL,MOTRIN) 400 MG tablet Take 400 mg by mouth every 6 (six) hours as needed.   Yes Historical Provider, MD  levocetirizine (XYZAL) 5 MG tablet Take 5 mg by mouth every evening.   Yes Historical Provider, MD  meloxicam (MOBIC) 7.5 MG tablet TAKE 1 TABLET BY MOUTH TWICE DAILY AS NEEDED FOR PAIN. TAKE WITH FOOD 08/27/15  Yes Thao P Le, DO  montelukast (SINGULAIR) 10 MG tablet Take one daily at bedtime 10/22/13  Yes Collene GobbleSteven A Daub, MD  Multiple Vitamins-Minerals (MULTIVITAMIN WITH MINERALS) tablet Take 1 tablet by mouth daily.   Yes Historical Provider, MD  sertraline (ZOLOFT) 50 MG tablet TAKE 1 TABLET(50 MG) BY MOUTH DAILY 09/22/15  Yes Peyton Najjaravid H Hopper, MD  sertraline (ZOLOFT) 50 MG tablet TAKE 1 TABLET(50 MG) BY MOUTH DAILY 11/01/15  Yes Peyton Najjaravid H Hopper, MD  fluticasone (FLONASE) 50 MCG/ACT nasal spray Place 2 sprays into both nostrils daily. Patient not taking: Reported on 11/28/2015  10/22/13   Collene Gobble, MD    Allergies  Allergen Reactions  . Penicillins     hives    History reviewed. No pertinent past surgical history.  Social History  Substance Use Topics  . Smoking status: Former Games developer  . Smokeless tobacco: None     Comment: has switched to electronic cigarette  . Alcohol Use: Yes     Comment: occassional    Family History  Problem Relation Age of Onset  . Arthritis Maternal Grandmother   . Heart disease Maternal Grandfather     Medication list has been reviewed and  updated.  Physical Examination:  Physical Exam  Constitutional: He is oriented to person, place, and time. He appears well-developed and well-nourished. No distress.  HENT:  Head: Normocephalic and atraumatic.  Right Ear: Hearing, tympanic membrane, external ear and ear canal normal.  Left Ear: Hearing, tympanic membrane, external ear and ear canal normal.  Nose: Nose normal.  Mouth/Throat: Uvula is midline. Posterior oropharyngeal erythema (mild) present. No oropharyngeal exudate or posterior oropharyngeal edema.  Eyes: Conjunctivae and lids are normal. Right eye exhibits no discharge. Left eye exhibits no discharge. No scleral icterus.  Cardiovascular: Normal rate, regular rhythm, normal heart sounds and normal pulses.   No murmur heard. Pulmonary/Chest: Effort normal and breath sounds normal. No respiratory distress. He has no wheezes. He has no rhonchi. He has no rales.  Abdominal: Soft. Normal appearance and bowel sounds are normal. There is tenderness (mild at several sites - epigastric, LLQ, RLQ).  Musculoskeletal: Normal range of motion.  Lymphadenopathy:       Head (right side): No submental, no submandibular and no tonsillar adenopathy present.       Head (left side): No submental, no submandibular and no tonsillar adenopathy present.    He has no cervical adenopathy.  Neurological: He is alert and oriented to person, place, and time.  Skin: Skin is warm, dry and intact. No lesion and no rash noted.  Psychiatric: He has a normal mood and affect. His speech is normal and behavior is normal. Thought content normal.   BP 128/88 mmHg  Pulse 95  Temp(Src) 98.3 F (36.8 C) (Oral)  Resp 16  Ht  (1.778 m)  Wt 301 lb 9.6 oz (136.805 kg)  BMI 43.28 kg/m2  SpO2 99%  Results for orders placed or performed in visit on 11/28/15  POCT CBC  Result Value Ref Range   WBC 6.8 4.6 - 10.2 K/uL   Lymph, poc 1.8 0.6 - 3.4   POC LYMPH PERCENT 26.5 10 - 50 %L   MID (cbc) 0.6 0 - 0.9    POC MID % 9.0 0 - 12 %M   POC Granulocyte 4.4 2 - 6.9   Granulocyte percent 64.5 37 - 80 %G   RBC 5.27 4.69 - 6.13 M/uL   Hemoglobin 15.2 14.1 - 18.1 g/dL   HCT, POC 09.8 11.9 - 53.7 %   MCV 84.4 80 - 97 fL   MCH, POC 28.8 27 - 31.2 pg   MCHC 34.1 31.8 - 35.4 g/dL   RDW, POC 14.7 %   Platelet Count, POC 200 142 - 424 K/uL   MPV 8.5 0 - 99.8 fL  POCT Microscopic Urinalysis ([facility])  Result Value Ref Range   WBC,UR,HPF,POC None None WBC/hpf   RBC,UR,HPF,POC None None RBC/hpf   Bacteria None None, Too numerous to count   Mucus Absent Absent   Epithelial Cells, UR Per Microscopy Few (A) None, Too  numerous to count cells/hpf  POCT urinalysis dipstick  Result Value Ref Range   Color, UA yellow yellow   Clarity, UA clear clear   Glucose, UA negative negative   Bilirubin, UA negative negative   Ketones, POC UA negative negative   Spec Grav, UA 1.020    Blood, UA negative negative   pH, UA 6.0    Protein Ur, POC trace (A) negative   Urobilinogen, UA 0.2    Nitrite, UA Negative Negative   Leukocytes, UA Negative Negative    Assessment and Plan:  1. Viral URI 2. Allergic rhinitis, unspecified allergic rhinitis type URI symptoms resolving. Continue meds for allergies. Counseled on supportive care.  3. Abdominal pain, epigastric 4. constipation CBC and UA normal. CMP pending. Possibly related to virus. Sounds more like GERD. Treat with zantac BID. Counseled on foods to avoid. miralax for constipation. Return in 1 week if symptoms do not improve or at any time if symptoms worsen.  - POCT CBC - POCT Microscopic Urinalysis ([facility]) - POCT urinalysis dipstick - Comprehensive metabolic panel   Roswell Miners. Dyke Brackett, MHS [facility]  11/28/2015

## 2015-11-28 NOTE — Patient Instructions (Addendum)
Drink plenty of water, 64 oz water a day. Limit citrus foods, caffeine, spicy foods. May take zantac up to twice a day for acid reflux. miralax 1 capful once a day can help with constipation. Return in 1 week if symptoms do not improve or at any time if symptoms worsen.     IF you received labwork today, you will receive an invoice from [facility]. Please contact [facility] at [phone] with questions or concerns regarding your invoice.   Our billing staff will not be able to assist you with questions regarding bills from these companies.  You will be contacted with the lab results as soon as they are available. The fastest way to get your results is to activate your My Chart account. Instructions are located on the last page of this paperwork. If you have not heard from us regarding the results in 2 weeks, please contact this office.

## 2015-12-02 ENCOUNTER — Other Ambulatory Visit: Payer: Self-pay | Admitting: Family Medicine

## 2015-12-13 ENCOUNTER — Other Ambulatory Visit: Payer: Self-pay | Admitting: Family Medicine

## 2015-12-25 DIAGNOSIS — J3089 Other allergic rhinitis: Secondary | ICD-10-CM | POA: Diagnosis not present

## 2015-12-25 DIAGNOSIS — J301 Allergic rhinitis due to pollen: Secondary | ICD-10-CM | POA: Diagnosis not present

## 2015-12-25 DIAGNOSIS — J3081 Allergic rhinitis due to animal (cat) (dog) hair and dander: Secondary | ICD-10-CM | POA: Diagnosis not present

## 2015-12-30 ENCOUNTER — Other Ambulatory Visit: Payer: Self-pay | Admitting: Family Medicine

## 2016-01-08 ENCOUNTER — Ambulatory Visit (INDEPENDENT_AMBULATORY_CARE_PROVIDER_SITE_OTHER): Payer: BLUE CROSS/BLUE SHIELD | Admitting: Family Medicine

## 2016-01-08 DIAGNOSIS — R42 Dizziness and giddiness: Secondary | ICD-10-CM | POA: Diagnosis not present

## 2016-01-08 DIAGNOSIS — E66813 Obesity, class 3: Secondary | ICD-10-CM | POA: Insufficient documentation

## 2016-01-08 LAB — TSH: TSH: 0.54 mIU/L (ref 0.40–4.50)

## 2016-01-08 MED ORDER — SERTRALINE HCL 50 MG PO TABS: 50.0000 mg | ORAL_TABLET | Freq: Every day | ORAL | Status: DC

## 2016-01-08 NOTE — Patient Instructions (Addendum)
Assessment:  I'm pretty certain the symptoms are arising out of sudden discontinuation of sertraline  Plan:  Restart the sertraline.  Let us know if the symptoms do not resolve.  Also, recheck the blood pressure when you are on your routine medications.

## 2016-01-08 NOTE — Progress Notes (Signed)
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  He awoke with some clumsiness and loss of dexterity.  No change in symptoms if position changes.  He lives with fiancee.  No headache or diplopia.  No change in hearing or tinnitus.  He did feel like his pulse was more rapid than usual.  F/H:  Grandmother has thyroid problem and hypertension.  Mother has serious drug problem.  Current Outpatient Prescriptions on File Prior to Visit  Medication Sig Dispense Refill  . albuterol (VENTOLIN HFA) 108 (90 BASE) MCG/ACT inhaler Inhale 2 puffs into the lungs every 6 hours as needed.  "OV NEEDED FOR ADDITIONAL REFILLS" 54 g 0  . amphetamine-dextroamphetamine (ADDERALL) 10 MG tablet Take 1 tablet (10 mg total) by mouth 2 (two) times daily. May fill on 11/18/2015 60 tablet 0  . diphenhydrAMINE (SOMINEX) 25 MG tablet Take 25 mg by mouth 4 (four) times daily as needed.    Marland Kitchen. EPINEPHrine (EPI-PEN) 0.3 mg/0.3 mL DEVI Inject 0.3 mLs (0.3 mg total) into the muscle once. 2 Device 0  . Fluocinolone Acetonide 0.01 % SHAM Use as shampoo twice a week until scaliness controlled 1 Bottle 0  . Fluticasone Furoate-Vilanterol 200-25 MCG/INH AEPB Inhale into the lungs.    Marland Kitchen. ibuprofen (ADVIL,MOTRIN) 400 MG tablet Take 400 mg by mouth every 6 (six) hours as needed.    Marland Kitchen. levocetirizine (XYZAL) 5 MG tablet Take 5 mg by mouth every evening.    . meloxicam (MOBIC) 7.5 MG tablet TAKE 1 TABLET BY MOUTH TWICE DAILY AS NEEDED FOR PAIN. TAKE WITH FOOD 180 tablet 1  . montelukast (SINGULAIR) 10 MG tablet Take one daily at bedtime 30 tablet 11  . Multiple Vitamins-Minerals (MULTIVITAMIN WITH MINERALS) tablet Take 1 tablet by mouth daily.    . sertraline (ZOLOFT) 50 MG tablet TAKE 1 TABLET BY MOUTH EVERY DAY 15 tablet 0   No current facility-administered medications on file prior to visit.   He takes the Adderall most weekdays.  He did not take it today. He takes Zoloft daily but hasn't had it in 4 to 5 days.   He's been on it 3 months.  Objective: BP 128/88 mmHg  Pulse 88  Temp(Src) 98 F (36.7 C) (Oral)  Resp 16  Ht 5\' 10"  (1.778 m)  Wt 307 lb (139.254 kg)  BMI 44.05 kg/m2  SpO2 97% BP recheck: 130/98 Heart: reg, no murmur Neuro:  Alert and appropriate, CN II-XII intact, moving 4 extremities well grossly  Assessment:  I'm pretty certain the symptoms are arising out of sudden discontinuation of sertraline  Plan:  Restart the sertraline.  Let us know if the symptoms do not resolve. We discussed weight loss as an important element  Of good health and we discussed reducing carbs.  Elvina SidleKurt Morgen Ritacco, MD

## 2016-01-15 DIAGNOSIS — J3081 Allergic rhinitis due to animal (cat) (dog) hair and dander: Secondary | ICD-10-CM | POA: Diagnosis not present

## 2016-01-15 DIAGNOSIS — J3089 Other allergic rhinitis: Secondary | ICD-10-CM | POA: Diagnosis not present

## 2016-01-15 DIAGNOSIS — J301 Allergic rhinitis due to pollen: Secondary | ICD-10-CM | POA: Diagnosis not present

## 2016-01-21 DIAGNOSIS — J301 Allergic rhinitis due to pollen: Secondary | ICD-10-CM | POA: Diagnosis not present

## 2016-01-21 DIAGNOSIS — J3089 Other allergic rhinitis: Secondary | ICD-10-CM | POA: Diagnosis not present

## 2016-01-21 DIAGNOSIS — J3081 Allergic rhinitis due to animal (cat) (dog) hair and dander: Secondary | ICD-10-CM | POA: Diagnosis not present

## 2016-01-21 DIAGNOSIS — H1045 Other chronic allergic conjunctivitis: Secondary | ICD-10-CM | POA: Diagnosis not present

## 2016-01-21 DIAGNOSIS — J4520 Mild intermittent asthma, uncomplicated: Secondary | ICD-10-CM | POA: Diagnosis not present

## 2016-02-05 DIAGNOSIS — J3081 Allergic rhinitis due to animal (cat) (dog) hair and dander: Secondary | ICD-10-CM | POA: Diagnosis not present

## 2016-02-05 DIAGNOSIS — J3089 Other allergic rhinitis: Secondary | ICD-10-CM | POA: Diagnosis not present

## 2016-02-05 DIAGNOSIS — J301 Allergic rhinitis due to pollen: Secondary | ICD-10-CM | POA: Diagnosis not present

## 2016-02-19 DIAGNOSIS — J3089 Other allergic rhinitis: Secondary | ICD-10-CM | POA: Diagnosis not present

## 2016-02-19 DIAGNOSIS — J301 Allergic rhinitis due to pollen: Secondary | ICD-10-CM | POA: Diagnosis not present

## 2016-02-19 DIAGNOSIS — J3081 Allergic rhinitis due to animal (cat) (dog) hair and dander: Secondary | ICD-10-CM | POA: Diagnosis not present

## 2016-02-20 DIAGNOSIS — M9907 Segmental and somatic dysfunction of upper extremity: Secondary | ICD-10-CM | POA: Diagnosis not present

## 2016-02-20 DIAGNOSIS — M542 Cervicalgia: Secondary | ICD-10-CM | POA: Diagnosis not present

## 2016-02-20 DIAGNOSIS — M25512 Pain in left shoulder: Secondary | ICD-10-CM | POA: Diagnosis not present

## 2016-02-20 DIAGNOSIS — M9901 Segmental and somatic dysfunction of cervical region: Secondary | ICD-10-CM | POA: Diagnosis not present

## 2016-02-23 DIAGNOSIS — M9907 Segmental and somatic dysfunction of upper extremity: Secondary | ICD-10-CM | POA: Diagnosis not present

## 2016-02-23 DIAGNOSIS — M9901 Segmental and somatic dysfunction of cervical region: Secondary | ICD-10-CM | POA: Diagnosis not present

## 2016-02-23 DIAGNOSIS — M25512 Pain in left shoulder: Secondary | ICD-10-CM | POA: Diagnosis not present

## 2016-02-23 DIAGNOSIS — M542 Cervicalgia: Secondary | ICD-10-CM | POA: Diagnosis not present

## 2016-02-25 DIAGNOSIS — M542 Cervicalgia: Secondary | ICD-10-CM | POA: Diagnosis not present

## 2016-02-25 DIAGNOSIS — M9907 Segmental and somatic dysfunction of upper extremity: Secondary | ICD-10-CM | POA: Diagnosis not present

## 2016-02-25 DIAGNOSIS — M9901 Segmental and somatic dysfunction of cervical region: Secondary | ICD-10-CM | POA: Diagnosis not present

## 2016-02-25 DIAGNOSIS — M25512 Pain in left shoulder: Secondary | ICD-10-CM | POA: Diagnosis not present

## 2016-02-26 DIAGNOSIS — J3081 Allergic rhinitis due to animal (cat) (dog) hair and dander: Secondary | ICD-10-CM | POA: Diagnosis not present

## 2016-02-26 DIAGNOSIS — J3089 Other allergic rhinitis: Secondary | ICD-10-CM | POA: Diagnosis not present

## 2016-02-26 DIAGNOSIS — J301 Allergic rhinitis due to pollen: Secondary | ICD-10-CM | POA: Diagnosis not present

## 2016-03-01 DIAGNOSIS — M542 Cervicalgia: Secondary | ICD-10-CM | POA: Diagnosis not present

## 2016-03-01 DIAGNOSIS — M9901 Segmental and somatic dysfunction of cervical region: Secondary | ICD-10-CM | POA: Diagnosis not present

## 2016-03-01 DIAGNOSIS — M9907 Segmental and somatic dysfunction of upper extremity: Secondary | ICD-10-CM | POA: Diagnosis not present

## 2016-03-01 DIAGNOSIS — M25512 Pain in left shoulder: Secondary | ICD-10-CM | POA: Diagnosis not present

## 2016-03-02 DIAGNOSIS — J301 Allergic rhinitis due to pollen: Secondary | ICD-10-CM | POA: Diagnosis not present

## 2016-03-03 DIAGNOSIS — M542 Cervicalgia: Secondary | ICD-10-CM | POA: Diagnosis not present

## 2016-03-03 DIAGNOSIS — J3089 Other allergic rhinitis: Secondary | ICD-10-CM | POA: Diagnosis not present

## 2016-03-03 DIAGNOSIS — M9901 Segmental and somatic dysfunction of cervical region: Secondary | ICD-10-CM | POA: Diagnosis not present

## 2016-03-03 DIAGNOSIS — M9907 Segmental and somatic dysfunction of upper extremity: Secondary | ICD-10-CM | POA: Diagnosis not present

## 2016-03-03 DIAGNOSIS — M25512 Pain in left shoulder: Secondary | ICD-10-CM | POA: Diagnosis not present

## 2016-03-03 DIAGNOSIS — J3081 Allergic rhinitis due to animal (cat) (dog) hair and dander: Secondary | ICD-10-CM | POA: Diagnosis not present

## 2016-03-05 DIAGNOSIS — J3089 Other allergic rhinitis: Secondary | ICD-10-CM | POA: Diagnosis not present

## 2016-03-05 DIAGNOSIS — J3081 Allergic rhinitis due to animal (cat) (dog) hair and dander: Secondary | ICD-10-CM | POA: Diagnosis not present

## 2016-03-05 DIAGNOSIS — J301 Allergic rhinitis due to pollen: Secondary | ICD-10-CM | POA: Diagnosis not present

## 2016-03-18 DIAGNOSIS — J3089 Other allergic rhinitis: Secondary | ICD-10-CM | POA: Diagnosis not present

## 2016-03-18 DIAGNOSIS — J3081 Allergic rhinitis due to animal (cat) (dog) hair and dander: Secondary | ICD-10-CM | POA: Diagnosis not present

## 2016-03-18 DIAGNOSIS — J301 Allergic rhinitis due to pollen: Secondary | ICD-10-CM | POA: Diagnosis not present

## 2016-03-25 DIAGNOSIS — J3089 Other allergic rhinitis: Secondary | ICD-10-CM | POA: Diagnosis not present

## 2016-03-25 DIAGNOSIS — J301 Allergic rhinitis due to pollen: Secondary | ICD-10-CM | POA: Diagnosis not present

## 2016-03-25 DIAGNOSIS — J3081 Allergic rhinitis due to animal (cat) (dog) hair and dander: Secondary | ICD-10-CM | POA: Diagnosis not present

## 2016-04-09 DIAGNOSIS — J3089 Other allergic rhinitis: Secondary | ICD-10-CM | POA: Diagnosis not present

## 2016-04-09 DIAGNOSIS — J301 Allergic rhinitis due to pollen: Secondary | ICD-10-CM | POA: Diagnosis not present

## 2016-04-09 DIAGNOSIS — J3081 Allergic rhinitis due to animal (cat) (dog) hair and dander: Secondary | ICD-10-CM | POA: Diagnosis not present

## 2016-04-15 DIAGNOSIS — J301 Allergic rhinitis due to pollen: Secondary | ICD-10-CM | POA: Diagnosis not present

## 2016-04-15 DIAGNOSIS — J3081 Allergic rhinitis due to animal (cat) (dog) hair and dander: Secondary | ICD-10-CM | POA: Diagnosis not present

## 2016-04-15 DIAGNOSIS — J3089 Other allergic rhinitis: Secondary | ICD-10-CM | POA: Diagnosis not present

## 2016-04-27 ENCOUNTER — Ambulatory Visit (INDEPENDENT_AMBULATORY_CARE_PROVIDER_SITE_OTHER): Payer: BLUE CROSS/BLUE SHIELD | Admitting: Family Medicine

## 2016-04-27 VITALS — BP 116/68 | HR 90 | Temp 99.0°F | Resp 18 | Ht 69.0 in | Wt 305.0 lb

## 2016-04-27 DIAGNOSIS — F909 Attention-deficit hyperactivity disorder, unspecified type: Secondary | ICD-10-CM | POA: Diagnosis not present

## 2016-04-27 DIAGNOSIS — G4700 Insomnia, unspecified: Secondary | ICD-10-CM | POA: Diagnosis not present

## 2016-04-27 DIAGNOSIS — F900 Attention-deficit hyperactivity disorder, predominantly inattentive type: Secondary | ICD-10-CM | POA: Diagnosis not present

## 2016-04-27 DIAGNOSIS — J45909 Unspecified asthma, uncomplicated: Secondary | ICD-10-CM

## 2016-04-27 MED ORDER — ALBUTEROL SULFATE HFA 108 (90 BASE) MCG/ACT IN AERS: INHALATION_SPRAY | RESPIRATORY_TRACT | 0 refills | Status: DC

## 2016-04-27 MED ORDER — AMPHETAMINE-DEXTROAMPHETAMINE 10 MG PO TABS: 10.0000 mg | ORAL_TABLET | Freq: Two times a day (BID) | ORAL | 0 refills | Status: DC

## 2016-04-27 MED ORDER — HYDROXYZINE HCL 25 MG PO TABS: 12.5000 mg | ORAL_TABLET | Freq: Every evening | ORAL | 0 refills | Status: DC | PRN

## 2016-04-27 NOTE — Progress Notes (Signed)
Patient ID: Brian Alvarez, male    DOB: [birth year], 30 y.o.   MRN: [id number]  PCP: Joaquin CourtsKimberly Mosetta Ferdinand, FNP  Chief Complaint  Patient presents with  . Medication Refill    albuterol and adderall    Subjective:   HPI Presents for medication refill request or Adderall and albuterol.  ADHD   Patient reports less productivity at work since running out of  Inattention at work and unable to organize and prioritize.  Starting on task not completing and going back to other. Thursday really feeling the effects of not having his medication. Take Adderall at 7 or 8 in the morning and sometimes it really late before he drifts off the sleep. Reports insomnia is not an every night occurrence but especially occurs when patient takes 10 mg twice daily.   Asthma  Patient requests a refill for short-acting inhaler, albuterol.  He is currently followed by Dr. Leadwood CallasSharma at [facility]. Denies recent shortness of breath or wheezing.  Reports overall asthma as controlled for the last few months.  . Social History   Social History  . Marital status: Single    Spouse name: N/A  . Number of children: N/A  . Years of education: N/A   Occupational History  . Not on file.   Social History Main Topics  . Smoking status: Former Games developermoker  . Smokeless tobacco: Not on file     Comment: has switched to electronic cigarette  . Alcohol use Yes     Comment: occassional  . Drug use: No  . Sexual activity: Yes   Other Topics Concern  . Not on file   Social History Narrative  . No narrative on file   . Family History  Problem Relation Age of Onset  . Arthritis Maternal Grandmother   . Heart disease Maternal Grandfather    Review of Systems  Constitutional: Negative.   Respiratory: Negative.   Cardiovascular: Negative.   Psychiatric/Behavioral: Positive for decreased concentration.       See HPI    Patient Active Problem List   Diagnosis Date Noted  . Morbid obesity (HCC)  01/08/2016  . Seasonal allergies 09/12/2012  . Asthma 12/26/2011  . Carpal tunnel syndrome 11/11/2011     Prior to Admission medications   Medication Sig Start Date End Date Taking? Authorizing Provider  albuterol (VENTOLIN HFA) 108 (90 BASE) MCG/ACT inhaler Inhale 2 puffs into the lungs every 6 hours as needed.  "OV NEEDED FOR ADDITIONAL REFILLS" 11/18/14  Yes Chelle Jeffery, PA-C  amphetamine-dextroamphetamine (ADDERALL) 10 MG tablet Take 1 tablet (10 mg total) by mouth 2 (two) times daily. May fill on 11/18/2015 07/19/15  Yes Thao P Le, DO  diphenhydrAMINE (SOMINEX) 25 MG tablet Take 25 mg by mouth 4 (four) times daily as needed.   Yes Historical Provider, MD  EPINEPHrine (EPI-PEN) 0.3 mg/0.3 mL DEVI Inject 0.3 mLs (0.3 mg total) into the muscle once. 10/17/12  Yes Elvina SidleKurt Lauenstein, MD  Fluticasone Furoate-Vilanterol 200-25 MCG/INH AEPB Inhale into the lungs.   Yes Historical Provider, MD  ibuprofen (ADVIL,MOTRIN) 400 MG tablet Take 400 mg by mouth every 6 (six) hours as needed.   Yes Historical Provider, MD  levocetirizine (XYZAL) 5 MG tablet Take 5 mg by mouth every evening.   Yes Historical Provider, MD  meloxicam (MOBIC) 7.5 MG tablet TAKE 1 TABLET BY MOUTH TWICE DAILY AS NEEDED FOR PAIN. TAKE WITH FOOD 08/27/15  Yes Thao P Le, DO  montelukast (SINGULAIR) 10 MG tablet Take one  daily at bedtime 10/22/13  Yes Collene Gobble, MD  Multiple Vitamins-Minerals (MULTIVITAMIN WITH MINERALS) tablet Take 1 tablet by mouth daily.   Yes Historical Provider, MD  sertraline (ZOLOFT) 50 MG tablet Take 1 tablet (50 mg total) by mouth daily. 01/08/16  Yes Elvina Sidle, MD  Fluocinolone Acetonide 0.01 % SHAM Use as shampoo twice a week until scaliness controlled Patient not taking: Reported on 04/27/2016 08/02/14   Tonye Pearson, MD     Allergies  Allergen Reactions  . Penicillins     hives       Objective:  Physical Exam  Constitutional: He is oriented to person, place, and time. He appears  well-developed and well-nourished.  HENT:  Head: Normocephalic.  Cardiovascular: Normal rate, regular rhythm and normal heart sounds.   Pulmonary/Chest: Effort normal and breath sounds normal.  Musculoskeletal: Normal range of motion.  Neurological: He is alert and oriented to person, place, and time.  Skin: Skin is warm and dry.    . Vitals:   04/27/16 1757  BP: 116/68  Pulse: 90  Resp: 18  Temp: 99 F (37.2 C)      Assessment & Plan:   1. Attention deficit hyperactivity disorder (ADHD), predominantly inattentive type - amphetamine-dextroamphetamine (ADDERALL) 10 MG tablet; Take 1 tablet (10 mg total) by mouth 2 (two) times daily.  Dispense: 60 tablet; Refill: 0  2. Asthma, unspecified asthma severity, uncomplicated . albuterol (VENTOLIN HFA) 108 (90 Base) MCG/ACT inhaler    Sig: Inhale 2 puffs into the lungs every 6 hours as needed.    3. Insomnia  . hydrOXYzine (ATARAX/VISTARIL) 25 MG tablet    Sig: Take 0.5-1 tablets (12.5-25 mg total) by mouth at bedtime as needed for itching.   Godfrey Pick. Tiburcio Pea, MSN, FNP-C [facility]

## 2016-04-27 NOTE — Patient Instructions (Addendum)
Follow-up as needed for medication refill.    IF you received an x-ray today, you will receive an invoice from [facility]. Please contact [facility] at [phone] with questions or concerns regarding your invoice.   IF you received labwork today, you will receive an invoice from [facility] Insomnia is a sleep disorder that makes it difficult to fall asleep or to stay asleep. Insomnia can cause tiredness (fatigue), low energy, difficulty concentrating, mood swings, and poor performance at work or school.  There are three different ways to classify insomnia:  Difficulty falling asleep.  Difficulty staying asleep.  Waking up too early in the morning. Any type of insomnia can be long-term (chronic) or short-term (acute). Both are common. Short-term insomnia usually lasts for three months or less. Chronic insomnia occurs at least three times a week for longer than three months. CAUSES  Insomnia may be caused by another condition, situation, or substance, such as:  Anxiety.  Certain medicines.  Gastroesophageal reflux disease (GERD) or other gastrointestinal conditions.  Asthma or other breathing conditions.  Restless legs syndrome, sleep apnea, or other sleep disorders.  Chronic pain.  Menopause. This may include hot flashes.  Stroke.  Abuse of alcohol, tobacco, or illegal drugs.  Depression.  Caffeine.   Neurological disorders, such as Alzheimer disease.  An overactive thyroid (hyperthyroidism). The cause of insomnia may not be known. RISK FACTORS Risk factors for insomnia include:  Gender. Women are more commonly affected than men.  Age. Insomnia is more common as you get older.  Stress. This may involve your professional or personal life.  Income. Insomnia is more common in people with lower income.  Lack of exercise.   Irregular work schedule or night shifts.  Traveling between different time zones. SIGNS AND SYMPTOMS If  you have insomnia, trouble falling asleep or trouble staying asleep is the main symptom. This may lead to other symptoms, such as:  Feeling fatigued.  Feeling nervous about going to sleep.  Not feeling rested in the morning.  Having trouble concentrating.  Feeling irritable, anxious, or depressed. TREATMENT  Treatment for insomnia depends on the cause. If your insomnia is caused by an underlying condition, treatment will focus on addressing the condition. Treatment may also include:   Medicines to help you sleep.  Counseling or therapy.  Lifestyle adjustments. HOME CARE INSTRUCTIONS   Take medicines only as directed by your health care provider.  Keep regular sleeping and waking hours. Avoid naps.  Keep a sleep diary to help you and your health care provider figure out what could be causing your insomnia. Include:   When you sleep.  When you wake up during the night.  How well you sleep.   How rested you feel the next day.  Any side effects of medicines you are taking.  What you eat and drink.   Make your bedroom a comfortable place where it is easy to fall asleep:  Put up shades or special blackout curtains to block light from outside.  Use a white noise machine to block noise.  Keep the temperature cool.   Exercise regularly as directed by your health care provider. Avoid exercising right before bedtime.  Use relaxation techniques to manage stress. Ask your health care provider to suggest some techniques that may work well for you. These may include:  Breathing exercises.  Routines to release muscle tension.  Visualizing peaceful scenes.  Cut back on alcohol, caffeinated beverages, and cigarettes, especially close to bedtime. These can disrupt your sleep.  Do not overeat or eat spicy foods right before bedtime. This can lead to digestive discomfort that can make it hard for you to sleep.  Limit screen use before bedtime. This includes:  Watching  TV.  Using your smartphone, tablet, and computer.  Stick to a routine. This can help you fall asleep faster. Try to do a quiet activity, brush your teeth, and go to bed at the same time each night.  Get out of bed if you are still awake after 15 minutes of trying to sleep. Keep the lights down, but try reading or doing a quiet activity. When you feel sleepy, go back to bed.  Make sure that you drive carefully. Avoid driving if you feel very sleepy.  Keep all follow-up appointments as directed by your health care provider. This is important. SEEK MEDICAL CARE IF:   You are tired throughout the day or have trouble in your daily routine due to sleepiness.  You continue to have sleep problems or your sleep problems get worse. SEEK IMMEDIATE MEDICAL CARE IF:   You have serious thoughts about hurting yourself or someone else.   This information is not intended to replace advice given to you by your health care provider. Make sure you discuss any questions you have with your health care provider.   Document Released: 09/03/2000 Document Revised: 05/28/2015 Document Reviewed: 06/07/2014 Elsevier Interactive Patient Education 2016 [employer].  [employer]. Please contact [facility] at [phone] with questions or concerns regarding your invoice.   Our billing staff will not be able to assist you with questions regarding bills from these companies.  You will be contacted with the lab results as soon as they are available. The fastest way to get your results is to activate your My Chart account. Instructions are located on the last page of this paperwork. If you have not heard from [place] regarding the results in 2 weeks, please contact this office.

## 2016-04-29 DIAGNOSIS — J301 Allergic rhinitis due to pollen: Secondary | ICD-10-CM | POA: Diagnosis not present

## 2016-04-29 DIAGNOSIS — F909 Attention-deficit hyperactivity disorder, unspecified type: Secondary | ICD-10-CM | POA: Insufficient documentation

## 2016-04-29 DIAGNOSIS — J3081 Allergic rhinitis due to animal (cat) (dog) hair and dander: Secondary | ICD-10-CM | POA: Diagnosis not present

## 2016-04-29 DIAGNOSIS — J3089 Other allergic rhinitis: Secondary | ICD-10-CM | POA: Diagnosis not present

## 2016-05-06 DIAGNOSIS — J301 Allergic rhinitis due to pollen: Secondary | ICD-10-CM | POA: Diagnosis not present

## 2016-05-06 DIAGNOSIS — J3081 Allergic rhinitis due to animal (cat) (dog) hair and dander: Secondary | ICD-10-CM | POA: Diagnosis not present

## 2016-05-06 DIAGNOSIS — J3089 Other allergic rhinitis: Secondary | ICD-10-CM | POA: Diagnosis not present

## 2016-05-13 DIAGNOSIS — J301 Allergic rhinitis due to pollen: Secondary | ICD-10-CM | POA: Diagnosis not present

## 2016-05-13 DIAGNOSIS — J3081 Allergic rhinitis due to animal (cat) (dog) hair and dander: Secondary | ICD-10-CM | POA: Diagnosis not present

## 2016-05-13 DIAGNOSIS — J3089 Other allergic rhinitis: Secondary | ICD-10-CM | POA: Diagnosis not present

## 2016-05-20 DIAGNOSIS — J301 Allergic rhinitis due to pollen: Secondary | ICD-10-CM | POA: Diagnosis not present

## 2016-05-20 DIAGNOSIS — J3081 Allergic rhinitis due to animal (cat) (dog) hair and dander: Secondary | ICD-10-CM | POA: Diagnosis not present

## 2016-05-20 DIAGNOSIS — J3089 Other allergic rhinitis: Secondary | ICD-10-CM | POA: Diagnosis not present

## 2016-05-27 DIAGNOSIS — J3089 Other allergic rhinitis: Secondary | ICD-10-CM | POA: Diagnosis not present

## 2016-05-27 DIAGNOSIS — J3081 Allergic rhinitis due to animal (cat) (dog) hair and dander: Secondary | ICD-10-CM | POA: Diagnosis not present

## 2016-05-27 DIAGNOSIS — J301 Allergic rhinitis due to pollen: Secondary | ICD-10-CM | POA: Diagnosis not present

## 2016-06-03 DIAGNOSIS — J3081 Allergic rhinitis due to animal (cat) (dog) hair and dander: Secondary | ICD-10-CM | POA: Diagnosis not present

## 2016-06-03 DIAGNOSIS — J3089 Other allergic rhinitis: Secondary | ICD-10-CM | POA: Diagnosis not present

## 2016-06-03 DIAGNOSIS — J301 Allergic rhinitis due to pollen: Secondary | ICD-10-CM | POA: Diagnosis not present

## 2016-06-09 ENCOUNTER — Ambulatory Visit (INDEPENDENT_AMBULATORY_CARE_PROVIDER_SITE_OTHER): Payer: BLUE CROSS/BLUE SHIELD | Admitting: Emergency Medicine

## 2016-06-09 VITALS — BP 138/102 | HR 84 | Temp 98.6°F | Resp 18 | Ht 68.5 in | Wt 301.6 lb

## 2016-06-09 DIAGNOSIS — M26621 Arthralgia of right temporomandibular joint: Secondary | ICD-10-CM

## 2016-06-09 MED ORDER — MUPIROCIN 2 % EX OINT: TOPICAL_OINTMENT | CUTANEOUS | 0 refills | Status: DC

## 2016-06-09 NOTE — Patient Instructions (Addendum)
Apply a small amount of ointment to the inside of the right ear twice a day.  Take 1-2 Aleve twice a day with food.   IF you received an x-ray today, you will receive an invoice from [facility]. Please contact [facility] at [phone] with questions or concerns regarding your invoice.   IF you received labwork today, you will receive an invoice from [facility]. Please contact [facility] at [phone] with questions or concerns regarding your invoice.   Our billing staff will not be able to assist you with questions regarding bills from these companies.  You will be contacted with the lab results as soon as they are available. The fastest way to get your results is to activate your My Chart account. Instructions are located on the last page of this paperwork. If you have not heard from us regarding the results in 2 weeks, please contact this office.     Temporomandibular Joint Syndrome Temporomandibular joint (TMJ) syndrome is a condition that affects the joints between your jaw and your skull. The TMJs are located near your ears and allow your jaw to open and close. These joints and the nearby muscles are involved in all movements of the jaw. People with TMJ syndrome have pain in the area of these joints and muscles. Chewing, biting, or other movements of the jaw can be difficult or painful. TMJ syndrome can be caused by various things. In many cases, the condition is mild and goes away within a few weeks. For some people, the condition can become a long-term problem. CAUSES Possible causes of TMJ syndrome include:  Grinding your teeth or clenching your jaw. Some people do this when they are under stress.  Arthritis.  Injury to the jaw.  Head or neck injury.  Teeth or dentures that are not aligned well. In some cases, the cause of TMJ syndrome may not be known. SIGNS AND SYMPTOMS The most common symptom is an aching pain on the side of  the head in the area of the TMJ. Other symptoms may include:  Pain when moving your jaw, such as when chewing or biting.  Being unable to open your jaw all the way.  Making a clicking sound when you open your mouth.  Headache.  Earache.  Neck or shoulder pain. DIAGNOSIS Diagnosis can usually be made based on your symptoms, your medical history, and a physical exam. Your health care provider may check the range of motion of your jaw. Imaging tests, such as X-rays or an MRI, are sometimes done. You may need to see your dentist to determine if your teeth and jaw are lined up correctly. TREATMENT TMJ syndrome often goes away on its own. If treatment is needed, the options may include:  Eating soft foods and applying ice or heat.  Medicines to relieve pain or inflammation.  Medicines to relax the muscles.  A splint, bite plate, or mouthpiece to prevent teeth grinding or jaw clenching.  Relaxation techniques or counseling to help reduce stress.  Transcutaneous electrical nerve stimulation (TENS). This helps to relieve pain by applying an electrical current through the skin.  Acupuncture. This is sometimes helpful to relieve pain.  Jaw surgery. This is rarely needed. HOME CARE INSTRUCTIONS  Take medicines only as directed by your health care provider.  Eat a soft diet if you are having trouble chewing.  Apply ice to the painful area.  Put ice in a plastic bag.  Place a towel between your skin and the  bag.  Leave the ice on for 20 minutes, 2-3 times a day.  Apply a warm compress to the painful area as directed.  Massage your jaw area and perform any jaw stretching exercises as recommended by your health care provider.  If you were given a mouthpiece or bite plate, wear it as directed.  Avoid foods that require a lot of chewing. Do not chew gum.  Keep all follow-up visits as directed by your health care provider. This is important. SEEK MEDICAL CARE IF:  You are  having trouble eating.  You have new or worsening symptoms. SEEK IMMEDIATE MEDICAL CARE IF:  Your jaw locks open or closed.   This information is not intended to replace advice given to you by your health care provider. Make sure you discuss any questions you have with your health care provider.   Document Released: 06/01/2001 Document Revised: 09/27/2014 Document Reviewed: 04/11/2014 Elsevier Interactive Patient Education [employer]

## 2016-06-09 NOTE — Progress Notes (Signed)
By signing my name below, I, Javier Docker, attest that this documentation has been prepared under the direction and in the presence of Earl Lites, MD. Electronically Signed: Javier Docker, ER Scribe. 06/09/2016. 1:11 PM.  Chief Complaint:  Chief Complaint  Patient presents with  . Ear Pain    Rt Ear  x 2 days    HPI: Parley Pidcock is a 30 y.o. male who reports to [facility] today complaining of two days of severe intermittent right ear pain. The ear is sore to the touch. He endorses phonophobia with high pitched noises, denies hearing disturbance. Moving his jaw quickly increases his pain. He had a dental extraction done two months ago. He denies mouth pain. He states his fiance is not aware of grinding his teeth at night. He denies increased stress.   Past Medical History:  Diagnosis Date  . Allergy   . Anemia   . Asthma   . Carpal tunnel syndrome   . Carpal tunnel syndrome    No past surgical history on file. Social History   Social History  . Marital status: Single    Spouse name: N/A  . Number of children: N/A  . Years of education: N/A   Social History Main Topics  . Smoking status: Former Games developer  . Smokeless tobacco: None     Comment: has switched to electronic cigarette  . Alcohol use Yes     Comment: occassional  . Drug use: No  . Sexual activity: Yes   Other Topics Concern  . None   Social History Narrative  . None   Family History  Problem Relation Age of Onset  . Arthritis Maternal Grandmother   . Heart disease Maternal Grandfather    Allergies  Allergen Reactions  . Penicillins     hives   Prior to Admission medications   Medication Sig Start Date End Date Taking? Authorizing Provider  albuterol (VENTOLIN HFA) 108 (90 Base) MCG/ACT inhaler Inhale 2 puffs into the lungs every 6 hours as needed.  "OV NEEDED FOR ADDITIONAL REFILLS" 04/27/16  Yes Doyle Askew, FNP  amphetamine-dextroamphetamine (ADDERALL) 10 MG tablet Take 1 tablet  (10 mg total) by mouth 2 (two) times daily. 04/27/16 06/09/16 Yes Doyle Askew, FNP  diphenhydrAMINE (SOMINEX) 25 MG tablet Take 25 mg by mouth 4 (four) times daily as needed.   Yes Historical Provider, MD  EPINEPHrine (EPI-PEN) 0.3 mg/0.3 mL DEVI Inject 0.3 mLs (0.3 mg total) into the muscle once. 10/17/12  Yes Elvina Sidle, MD  Fluocinolone Acetonide 0.01 % SHAM Use as shampoo twice a week until scaliness controlled 08/02/14  Yes Tonye Pearson, MD  Fluticasone Furoate-Vilanterol 200-25 MCG/INH AEPB Inhale into the lungs.   Yes Historical Provider, MD  hydrOXYzine (ATARAX/VISTARIL) 25 MG tablet Take 0.5-1 tablets (12.5-25 mg total) by mouth at bedtime as needed for itching. 04/27/16  Yes Doyle Askew, FNP  ibuprofen (ADVIL,MOTRIN) 400 MG tablet Take 400 mg by mouth every 6 (six) hours as needed.   Yes Historical Provider, MD  levocetirizine (XYZAL) 5 MG tablet Take 5 mg by mouth every evening.   Yes Historical Provider, MD  meloxicam (MOBIC) 7.5 MG tablet TAKE 1 TABLET BY MOUTH TWICE DAILY AS NEEDED FOR PAIN. TAKE WITH FOOD 08/27/15  Yes Thao P Le, DO  montelukast (SINGULAIR) 10 MG tablet Take one daily at bedtime 10/22/13  Yes Collene Gobble, MD  Multiple Vitamins-Minerals (MULTIVITAMIN WITH MINERALS) tablet Take 1 tablet by mouth daily.  Yes Historical Provider, MD  sertraline (ZOLOFT) 50 MG tablet Take 1 tablet (50 mg total) by mouth daily. 01/08/16  Yes Elvina SidleKurt Lauenstein, MD     ROS: The patient denies fevers, chills, night sweats, unintentional weight loss, chest pain, palpitations, wheezing, dyspnea on exertion, nausea, vomiting, abdominal pain, dysuria, hematuria, melena, numbness, weakness, or tingling.   All other systems have been reviewed and were otherwise negative with the exception of those mentioned in the HPI and as above.    PHYSICAL EXAM: Vitals:   06/09/16 1204  BP: (!) 138/102  Pulse: 84  Resp: 18  Temp: 98.6 F (37 C)   Body mass index is 45.19  kg/m.   General: Alert, no acute distress HEENT:  Normocephalic, atraumatic, oropharynx patent. Ear canal normal. TTP over right TMJ, pain with opening jaw. Wisdom teeth removed, otherwise his mouth looks normal.  [facility]: [facility], [facility] Cardiovascular:  Regular rate and rhythm, no rubs murmurs or gallops.  No Carotid bruits, radial pulse intact. No pedal edema.  Respiratory: Clear to auscultation bilaterally.  No wheezes, rales, or rhonchi.  No cyanosis, no use of accessory musculature Abdominal: No organomegaly, abdomen is soft and non-tender, positive bowel sounds.  No masses. Musculoskeletal: Gait intact. No edema, tenderness Skin: No rashes. Neurologic: Facial musculature symmetric. Psychiatric: Patient acts appropriately throughout our interaction. Lymphatic: No cervical or submandibular lymphadenopathy    LABS:    EKG/XRAY:   Primary [facility]   ASSESSMENT/PLAN: There is tenderness over the right TMJ. Teeth are normal. There are no pinpoint areas of tenderness in the ear canal and the drum is normal. Will treat with TMJ. I did place him on ibuprofen for pain and a soft diet. He was also given Bactroban to apply to the base of the ear canal just in case there was a low-grade folliculitis here.I personally performed the services described in this documentation, which was scribed in my presence. The recorded information has been reviewed and is accurate.   Gross sideeffects, risk and benefits, and alternatives of medications d/w patient. Patient is aware that all medications have potential sideeffects and we are unable to predict every sideeffect or drug-drug interaction that may occur.  Lesle ChrisSteven Chan Rosasco MD 06/09/2016 1:11 PM

## 2016-06-10 DIAGNOSIS — J3081 Allergic rhinitis due to animal (cat) (dog) hair and dander: Secondary | ICD-10-CM | POA: Diagnosis not present

## 2016-06-10 DIAGNOSIS — J3089 Other allergic rhinitis: Secondary | ICD-10-CM | POA: Diagnosis not present

## 2016-06-10 DIAGNOSIS — J301 Allergic rhinitis due to pollen: Secondary | ICD-10-CM | POA: Diagnosis not present

## 2016-06-18 DIAGNOSIS — J3081 Allergic rhinitis due to animal (cat) (dog) hair and dander: Secondary | ICD-10-CM | POA: Diagnosis not present

## 2016-06-18 DIAGNOSIS — J3089 Other allergic rhinitis: Secondary | ICD-10-CM | POA: Diagnosis not present

## 2016-06-18 DIAGNOSIS — J301 Allergic rhinitis due to pollen: Secondary | ICD-10-CM | POA: Diagnosis not present

## 2016-06-24 DIAGNOSIS — J3089 Other allergic rhinitis: Secondary | ICD-10-CM | POA: Diagnosis not present

## 2016-06-24 DIAGNOSIS — J3081 Allergic rhinitis due to animal (cat) (dog) hair and dander: Secondary | ICD-10-CM | POA: Diagnosis not present

## 2016-06-24 DIAGNOSIS — J301 Allergic rhinitis due to pollen: Secondary | ICD-10-CM | POA: Diagnosis not present

## 2016-06-26 ENCOUNTER — Ambulatory Visit (INDEPENDENT_AMBULATORY_CARE_PROVIDER_SITE_OTHER): Payer: BLUE CROSS/BLUE SHIELD | Admitting: Family Medicine

## 2016-06-26 VITALS — BP 126/80 | HR 87 | Temp 98.9°F | Resp 17 | Ht 69.5 in | Wt 304.0 lb

## 2016-06-26 DIAGNOSIS — M26621 Arthralgia of right temporomandibular joint: Secondary | ICD-10-CM | POA: Diagnosis not present

## 2016-06-26 MED ORDER — TRAMADOL HCL 50 MG PO TABS: 50.0000 mg | ORAL_TABLET | Freq: Three times a day (TID) | ORAL | 1 refills | Status: DC | PRN

## 2016-06-26 NOTE — Progress Notes (Signed)
Brian Alvarez is a 30 y.o. male who presents to [facility] toay for Right jaw pain:  1.  Right jaw pain:  Seen in mid September for the same. Diagnosed with likely TMJ at that point. Since that visit the pain has not really gotten any better. States it wakes him up at night and early in the morning. He has been taking ibuprofen over-the-counter without much relief. He use the mupirocin but has not used that with the past week. Has not noticed any difficulties with that.  Worse when opening jaw wide such as yawning. Also with sneezing. No changes in hearing. No drainage from ear. Did have recent dental procedure his mouth was propped open for 3-4 hours.  ROS as above.  No fevers or chills.   PMH reviewed. Patient is a nonsmoker.   Past Medical History:  Diagnosis Date  . Allergy   . Anemia   . Asthma   . Carpal tunnel syndrome   . Carpal tunnel syndrome    No past surgical history on file.  Medications reviewed. Current Outpatient Prescriptions  Medication Sig Dispense Refill  . albuterol (VENTOLIN HFA) 108 (90 Base) MCG/ACT inhaler Inhale 2 puffs into the lungs every 6 hours as needed.  "OV NEEDED FOR ADDITIONAL REFILLS" 54 g 0  . diphenhydrAMINE (SOMINEX) 25 MG tablet Take 25 mg by mouth 4 (four) times daily as needed.    Marland Kitchen. EPINEPHrine (EPI-PEN) 0.3 mg/0.3 mL DEVI Inject 0.3 mLs (0.3 mg total) into the muscle once. 2 Device 0  . Fluocinolone Acetonide 0.01 % SHAM Use as shampoo twice a week until scaliness controlled 1 Bottle 0  . Fluticasone Furoate-Vilanterol 200-25 MCG/INH AEPB Inhale into the lungs.    . hydrOXYzine (ATARAX/VISTARIL) 25 MG tablet Take 0.5-1 tablets (12.5-25 mg total) by mouth at bedtime as needed for itching. 30 tablet 0  . ibuprofen (ADVIL,MOTRIN) 400 MG tablet Take 400 mg by mouth every 6 (six) hours as needed.    Marland Kitchen. levocetirizine (XYZAL) 5 MG tablet Take 5 mg by mouth every evening.    . montelukast (SINGULAIR) 10 MG tablet Take one daily  at bedtime 30 tablet 11  . Multiple Vitamins-Minerals (MULTIVITAMIN WITH MINERALS) tablet Take 1 tablet by mouth daily.    . mupirocin ointment (BACTROBAN) 2 % Apply small amount to the inside of your right ear twice a day 22 g 0  . sertraline (ZOLOFT) 50 MG tablet Take 1 tablet (50 mg total) by mouth daily. 90 tablet 3  . amphetamine-dextroamphetamine (ADDERALL) 10 MG tablet Take 1 tablet (10 mg total) by mouth 2 (two) times daily. 60 tablet 0  . meloxicam (MOBIC) 7.5 MG tablet TAKE 1 TABLET BY MOUTH TWICE DAILY AS NEEDED FOR PAIN. TAKE WITH FOOD (Patient not taking: Reported on 06/26/2016) 180 tablet 1   No current facility-administered medications for this visit.      Physical Exam:  BP 126/80 (BP Location: Right Arm, Patient Position: Sitting, Cuff Size: Normal)   Pulse 87   Temp 98.9 F (37.2 C) (Oral)   Resp 17   Ht 5' 9.5" (1.765 m)   Wt (!) 304 lb (137.9 kg)   SpO2 94%   BMI 44.25 kg/m  Gen:  Patient sitting on exam table, appears stated age in no acute distress Head: Normocephalic atraumatic Eyes: EOMI, PERRL, sclera and conjunctiva non-erythematous Ears:  Canals clear bilaterally.  TMs pearly gray bilaterally without erythema or bulging.   Nose:  Nasal turbinates normal  Mouth: Mucosa membranes moist. Tonsils +2, nonenlarged, non-erythematous.  Nontender over left mandibular joint. Tenderness directly over right TMJ. I do feel a very minor crepitus here as well. Neck: No cervical lymphadenopathy noted Heart:  RRR, no murmurs auscultated. Pulm:  Clear to auscultation bilaterally with good air movement.  No wheezes or rales noted.       Assessment and Plan:  1.  TMJ:   - no evidence of ear involvement. - I gave him some massage exercises to help with this as well.  Tramadol prn - Patient like to hold off on anything like surgery for as long as possible. Will return if this does not help.

## 2016-06-26 NOTE — Patient Instructions (Addendum)
Take the Tramadol for pain relief.  You should also try massage of your jaw muscles like we discussed.  If this isn't helping be sure to let us know.  It was good to meet you today!  Temporomandibular Joint Syndrome Temporomandibular joint (TMJ) syndrome is a condition that affects the joints between your jaw and your skull. The TMJs are located near your ears and allow your jaw to open and close. These joints and the nearby muscles are involved in all movements of the jaw. People with TMJ syndrome have pain in the area of these joints and muscles. Chewing, biting, or other movements of the jaw can be difficult or painful. TMJ syndrome can be caused by various things. In many cases, the condition is mild and goes away within a few weeks. For some people, the condition can become a long-term problem. CAUSES Possible causes of TMJ syndrome include:  Grinding your teeth or clenching your jaw. Some people do this when they are under stress.  Arthritis.  Injury to the jaw.  Head or neck injury.  Teeth or dentures that are not aligned well. In some cases, the cause of TMJ syndrome may not be known. SIGNS AND SYMPTOMS The most common symptom is an aching pain on the side of the head in the area of the TMJ. Other symptoms may include:  Pain when moving your jaw, such as when chewing or biting.  Being unable to open your jaw all the way.  Making a clicking sound when you open your mouth.  Headache.  Earache.  Neck or shoulder pain. DIAGNOSIS Diagnosis can usually be made based on your symptoms, your medical history, and a physical exam. Your health care provider may check the range of motion of your jaw. Imaging tests, such as X-rays or an MRI, are sometimes done. You may need to see your dentist to determine if your teeth and jaw are lined up correctly. TREATMENT TMJ syndrome often goes away on its own. If treatment is needed, the options may include:  Eating soft foods and  applying ice or heat.  Medicines to relieve pain or inflammation.  Medicines to relax the muscles.  A splint, bite plate, or mouthpiece to prevent teeth grinding or jaw clenching.  Relaxation techniques or counseling to help reduce stress.  Transcutaneous electrical nerve stimulation (TENS). This helps to relieve pain by applying an electrical current through the skin.  Acupuncture. This is sometimes helpful to relieve pain.  Jaw surgery. This is rarely needed. HOME CARE INSTRUCTIONS  Take medicines only as directed by your health care provider.  Eat a soft diet if you are having trouble chewing.  Apply ice to the painful area.  Put ice in a plastic bag.  Place a towel between your skin and the bag.  Leave the ice on for 20 minutes, 2-3 times a day.  Apply a warm compress to the painful area as directed.  Massage your jaw area and perform any jaw stretching exercises as recommended by your health care provider.  If you were given a mouthpiece or bite plate, wear it as directed.  Avoid foods that require a lot of chewing. Do not chew gum.  Keep all follow-up visits as directed by your health care provider. This is important. SEEK MEDICAL CARE IF:  You are having trouble eating.  You have new or worsening symptoms. SEEK IMMEDIATE MEDICAL CARE IF:  Your jaw locks open or closed.   This information is not intended to replace advice  given to you by your health care provider. Make sure you discuss any questions you have with your health care provider.   Document Released: 06/01/2001 Document Revised: 09/27/2014 Document Reviewed: 04/11/2014 Elsevier Interactive Patient Education 2016 [employer].    IF you received an x-ray today, you will receive an invoice from [facility]. Please contact [facility] at [phone] with questions or concerns regarding your invoice.   IF you received labwork today, you will receive an invoice from [employer]. Please contact [facility] at [phone] with questions or concerns regarding your invoice.   Our billing staff will not be able to assist you with questions regarding bills from these companies.  You will be contacted with the lab results as soon as they are available. The fastest way to get your results is to activate your My Chart account. Instructions are located on the last page of this paperwork. If you have not heard from [place] regarding the results in 2 weeks, please contact this office.

## 2016-06-30 DIAGNOSIS — J301 Allergic rhinitis due to pollen: Secondary | ICD-10-CM | POA: Diagnosis not present

## 2016-06-30 DIAGNOSIS — J3089 Other allergic rhinitis: Secondary | ICD-10-CM | POA: Diagnosis not present

## 2016-06-30 DIAGNOSIS — J3081 Allergic rhinitis due to animal (cat) (dog) hair and dander: Secondary | ICD-10-CM | POA: Diagnosis not present

## 2016-07-08 DIAGNOSIS — J3089 Other allergic rhinitis: Secondary | ICD-10-CM | POA: Diagnosis not present

## 2016-07-08 DIAGNOSIS — J301 Allergic rhinitis due to pollen: Secondary | ICD-10-CM | POA: Diagnosis not present

## 2016-07-08 DIAGNOSIS — J3081 Allergic rhinitis due to animal (cat) (dog) hair and dander: Secondary | ICD-10-CM | POA: Diagnosis not present

## 2016-07-22 DIAGNOSIS — J3081 Allergic rhinitis due to animal (cat) (dog) hair and dander: Secondary | ICD-10-CM | POA: Diagnosis not present

## 2016-07-22 DIAGNOSIS — J301 Allergic rhinitis due to pollen: Secondary | ICD-10-CM | POA: Diagnosis not present

## 2016-07-22 DIAGNOSIS — J3089 Other allergic rhinitis: Secondary | ICD-10-CM | POA: Diagnosis not present

## 2016-08-07 ENCOUNTER — Encounter: Payer: Self-pay | Admitting: Family Medicine

## 2016-08-07 ENCOUNTER — Ambulatory Visit (INDEPENDENT_AMBULATORY_CARE_PROVIDER_SITE_OTHER): Payer: BLUE CROSS/BLUE SHIELD | Admitting: Family Medicine

## 2016-08-07 VITALS — BP 110/78 | HR 86 | Temp 98.5°F | Resp 18 | Ht 69.5 in | Wt 305.4 lb

## 2016-08-07 DIAGNOSIS — R509 Fever, unspecified: Secondary | ICD-10-CM

## 2016-08-07 DIAGNOSIS — F900 Attention-deficit hyperactivity disorder, predominantly inattentive type: Secondary | ICD-10-CM

## 2016-08-07 DIAGNOSIS — F5101 Primary insomnia: Secondary | ICD-10-CM | POA: Diagnosis not present

## 2016-08-07 DIAGNOSIS — Z23 Encounter for immunization: Secondary | ICD-10-CM

## 2016-08-07 MED ORDER — AMPHETAMINE-DEXTROAMPHETAMINE 10 MG PO TABS: 10.0000 mg | ORAL_TABLET | Freq: Two times a day (BID) | ORAL | 0 refills | Status: DC

## 2016-08-07 NOTE — Progress Notes (Signed)
Chief Complaint  Patient presents with  . Headache    x 2 days, fever, nausea on Thursday and Friday , sinus pressure     HPI  Established   Pt reports that last night his temperature alternated between an oral temperature of 102 and 96. Reports that he had chills and fevers all night.  He states that when the temperature was 102 he felt hot.  He states that he had a headache that was light and sound sensitive. He denies cough Reports postnasal drip States that he had sinus pressure over night and was behind his eyes and forehead.  He reported some muscle aches and pains.  He reports that he has not had a flew shot. He did not take any medication for this.  His symptoms resolved this morning.   ADHD He reports that he takes adderall most days that he works and does not want to take it everyday He reports that he usually has some left over. He usually takes 1 tablet in the morning.  Occasionally he takes an afternoon dose if he is taking longer to be able to focus.  He often takes caffeine in the morning and at lunch. Denies heart palpitations, he has some trouble with sleeping that is beginning to improve.   Insomnia Reports that he has been sleeping better with hydroxyzine despite his adderall use.  He reports that he feels well rested and does not feel groggy in the morning.   Past Medical History:  Diagnosis Date  . Allergy   . Anemia   . Asthma   . Carpal tunnel syndrome   . Carpal tunnel syndrome     Current Outpatient Prescriptions  Medication Sig Dispense Refill  . albuterol (VENTOLIN HFA) 108 (90 Base) MCG/ACT inhaler Inhale 2 puffs into the lungs every 6 hours as needed.  "OV NEEDED FOR ADDITIONAL REFILLS" 54 g 0  . diphenhydrAMINE (SOMINEX) 25 MG tablet Take 25 mg by mouth 4 (four) times daily as needed.    Marland Kitchen. EPINEPHrine (EPI-PEN) 0.3 mg/0.3 mL DEVI Inject 0.3 mLs (0.3 mg total) into the muscle once. 2 Device 0  . Fluocinolone Acetonide 0.01 % SHAM Use as shampoo  twice a week until scaliness controlled 1 Bottle 0  . Fluticasone Furoate-Vilanterol 200-25 MCG/INH AEPB Inhale into the lungs.    . hydrOXYzine (ATARAX/VISTARIL) 25 MG tablet Take 0.5-1 tablets (12.5-25 mg total) by mouth at bedtime as needed for itching. 30 tablet 0  . levocetirizine (XYZAL) 5 MG tablet Take 5 mg by mouth every evening.    . montelukast (SINGULAIR) 10 MG tablet Take one daily at bedtime 30 tablet 11  . Multiple Vitamins-Minerals (MULTIVITAMIN WITH MINERALS) tablet Take 1 tablet by mouth daily.    . sertraline (ZOLOFT) 50 MG tablet Take 1 tablet (50 mg total) by mouth daily. 90 tablet 3  . traMADol (ULTRAM) 50 MG tablet Take 1 tablet (50 mg total) by mouth every 8 (eight) hours as needed. 25 tablet 1  . amphetamine-dextroamphetamine (ADDERALL) 10 MG tablet Take 1 tablet (10 mg total) by mouth 2 (two) times daily with a meal. 60 tablet 0  . ibuprofen (ADVIL,MOTRIN) 400 MG tablet Take 400 mg by mouth every 6 (six) hours as needed.    . meloxicam (MOBIC) 7.5 MG tablet TAKE 1 TABLET BY MOUTH TWICE DAILY AS NEEDED FOR PAIN. TAKE WITH FOOD (Patient not taking: Reported on 08/07/2016) 180 tablet 1  . mupirocin ointment (BACTROBAN) 2 % Apply small amount to the inside  of your right ear twice a day (Patient not taking: Reported on 08/07/2016) 22 g 0   No current facility-administered medications for this visit.     Allergies:  Allergies  Allergen Reactions  . Penicillins     hives    History reviewed. No pertinent surgical history.  Social History   Social History  . Marital status: Single    Spouse name: N/A  . Number of children: N/A  . Years of education: N/A   Social History Main Topics  . Smoking status: Former Games developermoker  . Smokeless tobacco: Never Used     Comment: has switched to electronic cigarette  . Alcohol use Yes     Comment: occassional  . Drug use: No  . Sexual activity: Yes   Other Topics Concern  . None   Social History Narrative  . None     ROS See hpi  Objective: Vitals:   08/07/16 1356  BP: 110/78  Pulse: 86  Resp: 18  Temp: 98.5 F (36.9 C)  TempSrc: Oral  SpO2: 96%  Weight: (!) 305 lb 6.4 oz (138.5 kg)  Height: 5' 9.5" (1.765 m)    Physical Exam General: alert, oriented, in NAD Head: normocephalic, atraumatic, no sinus tenderness Eyes: EOM intact, no scleral icterus or conjunctival injection Ears: TM clear bilaterally Throat: no pharyngeal exudate or erythema Lymph: no posterior auricular, submental or cervical lymph adenopathy Heart: normal rate, normal sinus rhythm, no murmurs Lungs: clear to auscultation bilaterally, no wheezing   Assessment and Plan Fayrene FearingJames was seen today for headache.  Diagnoses and all orders for this visit:  Need for prophylactic vaccination and inoculation against influenza -     Flu Vaccine QUAD 36+ mos PF IM (Fluarix & Fluzone Quad PF)  Attention deficit hyperactivity disorder (ADHD), predominantly inattentive type -  Refilled Adderall for one month supply  Elevated temperature - resolved and no evidence to cause on exam Supportive care suggested  Primary insomnia- continue hydroxyzine as needed, avoid afternoon adderall when possible     Zoe A Creta LevinStallings

## 2016-08-07 NOTE — Patient Instructions (Addendum)
   IF you received an x-ray today, you will receive an invoice from [facility]. Please contact [facility] at [phone] with questions or concerns regarding your invoice.   IF you received labwork today, you will receive an invoice from [facility]. Please contact [facility] at [phone] with questions or concerns regarding your invoice.   Our billing staff will not be able to assist you with questions regarding bills from these companies.  You will be contacted with the lab results as soon as they are available. The fastest way to get your results is to activate your My Chart account. Instructions are located on the last page of this paperwork. If you have not heard from us regarding the results in 2 weeks, please contact this office.     Attention Deficit Hyperactivity Disorder, Pediatric Attention deficit hyperactivity disorder (ADHD) is a condition that can make it hard for a child to pay attention and concentrate or to control his or her behavior. The child may also have a lot of energy. ADHD is a disorder of the brain (neurodevelopmental disorder), and symptoms are typically first seen in early childhood. It is a common reason for behavioral and academic problems in school. There are three main types of ADHD:  Inattentive. With this type, children have difficulty paying attention.  Hyperactive-impulsive. With this type, children have a lot of energy and have difficulty controlling their behavior.  Combination. This type involves having symptoms of both of the other types. ADHD is a lifelong condition. If it is not treated, the disorder can affect a child's future academic achievement, employment, and relationships. What are the causes? The exact cause of this condition is not known. What increases the risk? This condition is more likely to develop in:  Children who have a first-degree relative, such as a parent or brother or sister,  with the condition.  Children who had a low birth weight.  Children whose mothers had problems during pregnancy or used alcohol or tobacco during pregnancy.  Children who have had a brain infection or a head injury.  Children who have been exposed to lead. What are the signs or symptoms? Symptoms of this condition depend on the type of ADHD. Symptoms are listed here for each type: Inattentive  Problems with organization.  Difficulty staying focused.  Problems completing assignments at school.  Often making simple mistakes.  Problems sustaining mental effort.  Not listening to instructions.  Losing things often.  Forgetting things often.  Being easily distracted. Hyperactive-impulsive  Fidgeting often.  Difficulty sitting still in one's seat.  Talking a lot.  Talking out of turn.  Interrupting others.  Difficulty relaxing or doing quiet activities.  High energy levels and constant movement.  Difficulty waiting.  Always "on the go." Combination  Having symptoms of both of the other types. Children with ADHD may feel frustrated with themselves and may find school to be particularly discouraging. They often perform below their abilities in school. As children get older, the excess movement can lessen, but the problems with paying attention and staying organized often continue. Most children do not outgrow ADHD, but with good treatment, they can learn to cope with the symptoms. How is this diagnosed? This condition is diagnosed based on a child's symptoms and academic history. The child's health care provider will do a complete assessment. As part of the assessment, the health care provider will ask the child questions and will ask the parents and teachers for their observations of the   child. The health care provider looks for specific symptoms of ADHD. Diagnosis will include:  Ruling out other reasons for the child's behavior.  Reviewing behavior rating scales  that have been filled out about the child by people who deal with the child on a daily basis. A diagnosis is made only after all information from multiple people has been considered. How is this treated? Treatment for this condition may include:  Behavior therapy.  Medicines to decrease impulsivity and hyperactivity and to increase attention. Behavior therapy is preferred for children younger than 6 years old. The combination of medicine and behavior therapy is most effective for children older than 6 years of age.  Tutoring or extra support at school.  Techniques for parents to use at home to help manage their child's symptoms and behavior. Follow these instructions at home: Eating and drinking  Offer your child a well-balanced diet. Breakfast that includes a balance of whole grains, protein, and fruits or vegetables is especially important for school performance.  If your child has trouble with hyperactivity, have your child avoid drinks that contain caffeine. These include:  Soft drinks.  Coffee.  Tea.  If your child is older and finds that caffeinated drinks help to improve his or her attention, talk with your child's health care provider about what amount of caffeine intake is a safe for your child. Lifestyle  Make sure your child gets a full night of sleep and regular daily exercise.  Help manage your child's behavior by following the techniques learned in therapy. These may include:  Looking for good behavior and rewarding it.  Making rules for behavior that your child can understand and follow.  Giving clear instructions.  Responding consistently to your child's challenging behaviors.  Setting realistic goals.  Looking for activities that can lead to success and self-esteem.  Making time for pleasant activities with your child.  Giving lots of affection.  Help your child learn to be organized. Some ways to do this include:  Keeping daily schedules the same.  Have a regular wake-up time and bedtime for your child. Schedule all activities, including time for homework and time for play. Post the schedule in a place where your child will see it. Mark schedule changes in advance.  Having a regular place for your child to store items such as clothing, backpacks, and school supplies.  Encouraging your child to write down school assignments and to bring home needed books. Work with your child's teachers for assistance in organizing school work. General instructions  Learn as much as you can about ADHD. This will improve your ability to help your child and to make sure he or she gets the support needed. It will also help you educate your child's teachers and instructors if they do not feel that they have adequate knowledge or experience in these areas.  Work with your child's teachers to make sure your child gets the support and extra help that is needed. This may include:  Tutoring.  Teacher cues to help your child remain on task.  Seating changes so your child is working at a desk that is free from distractions.  Give over-the-counter and prescription medicines only as told by your child's health care provider.  Keep all follow-up visits as told by your health care provider. This is important. Contact a health care provider if:  Your child has repeated muscle twitches (tics), coughs, or speech outbursts.  Your child has sleep problems.  Your child has a marked loss of   appetite.  Your child develops depression.  Your child has new or worsening behavioral problems.  Your child has dizziness.  Your child has a racing heart.  Your child has stomach pains.  Your child develops headaches. Get help right away if:  Your child talks about or threatens suicide.  You are worried that your child is having a bad reaction to a medicine that he or she is taking for ADHD. This information is not intended to replace advice given to you by your health  care provider. Make sure you discuss any questions you have with your health care provider. Document Released: 08/27/2002 Document Revised: 05/05/2016 Document Reviewed: 04/01/2016 Elsevier Interactive Patient Education  2017 [employer]vier Inc.  

## 2016-08-19 DIAGNOSIS — J3089 Other allergic rhinitis: Secondary | ICD-10-CM | POA: Diagnosis not present

## 2016-08-19 DIAGNOSIS — J3081 Allergic rhinitis due to animal (cat) (dog) hair and dander: Secondary | ICD-10-CM | POA: Diagnosis not present

## 2016-08-19 DIAGNOSIS — J301 Allergic rhinitis due to pollen: Secondary | ICD-10-CM | POA: Diagnosis not present

## 2016-08-26 DIAGNOSIS — J3089 Other allergic rhinitis: Secondary | ICD-10-CM | POA: Diagnosis not present

## 2016-08-26 DIAGNOSIS — J301 Allergic rhinitis due to pollen: Secondary | ICD-10-CM | POA: Diagnosis not present

## 2016-08-26 DIAGNOSIS — J3081 Allergic rhinitis due to animal (cat) (dog) hair and dander: Secondary | ICD-10-CM | POA: Diagnosis not present

## 2016-09-02 DIAGNOSIS — J301 Allergic rhinitis due to pollen: Secondary | ICD-10-CM | POA: Diagnosis not present

## 2016-09-02 DIAGNOSIS — J3089 Other allergic rhinitis: Secondary | ICD-10-CM | POA: Diagnosis not present

## 2016-09-02 DIAGNOSIS — J3081 Allergic rhinitis due to animal (cat) (dog) hair and dander: Secondary | ICD-10-CM | POA: Diagnosis not present

## 2016-09-09 DIAGNOSIS — J3081 Allergic rhinitis due to animal (cat) (dog) hair and dander: Secondary | ICD-10-CM | POA: Diagnosis not present

## 2016-09-09 DIAGNOSIS — J3089 Other allergic rhinitis: Secondary | ICD-10-CM | POA: Diagnosis not present

## 2016-09-09 DIAGNOSIS — J301 Allergic rhinitis due to pollen: Secondary | ICD-10-CM | POA: Diagnosis not present

## 2016-09-16 DIAGNOSIS — J301 Allergic rhinitis due to pollen: Secondary | ICD-10-CM | POA: Diagnosis not present

## 2016-09-16 DIAGNOSIS — J3081 Allergic rhinitis due to animal (cat) (dog) hair and dander: Secondary | ICD-10-CM | POA: Diagnosis not present

## 2016-09-16 DIAGNOSIS — J3089 Other allergic rhinitis: Secondary | ICD-10-CM | POA: Diagnosis not present

## 2016-09-29 DIAGNOSIS — J3081 Allergic rhinitis due to animal (cat) (dog) hair and dander: Secondary | ICD-10-CM | POA: Diagnosis not present

## 2016-09-29 DIAGNOSIS — J3089 Other allergic rhinitis: Secondary | ICD-10-CM | POA: Diagnosis not present

## 2016-09-29 DIAGNOSIS — J301 Allergic rhinitis due to pollen: Secondary | ICD-10-CM | POA: Diagnosis not present

## 2016-10-05 DIAGNOSIS — J301 Allergic rhinitis due to pollen: Secondary | ICD-10-CM | POA: Diagnosis not present

## 2016-10-06 DIAGNOSIS — J3081 Allergic rhinitis due to animal (cat) (dog) hair and dander: Secondary | ICD-10-CM | POA: Diagnosis not present

## 2016-10-06 DIAGNOSIS — J3089 Other allergic rhinitis: Secondary | ICD-10-CM | POA: Diagnosis not present

## 2016-10-14 DIAGNOSIS — J3081 Allergic rhinitis due to animal (cat) (dog) hair and dander: Secondary | ICD-10-CM | POA: Diagnosis not present

## 2016-10-14 DIAGNOSIS — J301 Allergic rhinitis due to pollen: Secondary | ICD-10-CM | POA: Diagnosis not present

## 2016-10-14 DIAGNOSIS — J3089 Other allergic rhinitis: Secondary | ICD-10-CM | POA: Diagnosis not present

## 2016-10-16 ENCOUNTER — Ambulatory Visit (INDEPENDENT_AMBULATORY_CARE_PROVIDER_SITE_OTHER): Payer: BLUE CROSS/BLUE SHIELD | Admitting: Urgent Care

## 2016-10-16 VITALS — BP 136/74 | HR 112 | Temp 98.2°F | Resp 18 | Ht 69.5 in | Wt 304.0 lb

## 2016-10-16 DIAGNOSIS — J45909 Unspecified asthma, uncomplicated: Secondary | ICD-10-CM

## 2016-10-16 DIAGNOSIS — R05 Cough: Secondary | ICD-10-CM | POA: Diagnosis not present

## 2016-10-16 DIAGNOSIS — R062 Wheezing: Secondary | ICD-10-CM

## 2016-10-16 DIAGNOSIS — R52 Pain, unspecified: Secondary | ICD-10-CM

## 2016-10-16 DIAGNOSIS — B349 Viral infection, unspecified: Secondary | ICD-10-CM | POA: Diagnosis not present

## 2016-10-16 DIAGNOSIS — R059 Cough, unspecified: Secondary | ICD-10-CM

## 2016-10-16 MED ORDER — HYDROCODONE-HOMATROPINE 5-1.5 MG/5ML PO SYRP: 5.0000 mL | ORAL_SOLUTION | Freq: Every evening | ORAL | 0 refills | Status: DC | PRN

## 2016-10-16 MED ORDER — AZITHROMYCIN 250 MG PO TABS: ORAL_TABLET | ORAL | 0 refills | Status: DC

## 2016-10-16 MED ORDER — IPRATROPIUM BROMIDE 0.02 % IN SOLN
0.5000 mg | Freq: Once | RESPIRATORY_TRACT | Status: AC
  Administered 2016-10-16: 0.5 mg via RESPIRATORY_TRACT

## 2016-10-16 MED ORDER — PSEUDOEPHEDRINE HCL ER 120 MG PO TB12: 120.0000 mg | ORAL_TABLET | Freq: Two times a day (BID) | ORAL | 3 refills | Status: DC

## 2016-10-16 MED ORDER — BENZONATATE 100 MG PO CAPS: 100.0000 mg | ORAL_CAPSULE | Freq: Three times a day (TID) | ORAL | 0 refills | Status: DC | PRN

## 2016-10-16 MED ORDER — ALBUTEROL SULFATE (2.5 MG/3ML) 0.083% IN NEBU
2.5000 mg | INHALATION_SOLUTION | Freq: Once | RESPIRATORY_TRACT | Status: AC
  Administered 2016-10-16: 2.5 mg via RESPIRATORY_TRACT

## 2016-10-16 NOTE — Patient Instructions (Signed)

## 2016-10-16 NOTE — Progress Notes (Signed)
  MRN: [id number] DOB: 10-04-1985  Subjective:   Brian Alvarez is a 31 y.o. male presenting for chief complaint of flu like symptoms (C/O cough since Wed, fever/body aches/pain/headache/light sensitivity/chills since yesterday)  Reports 2 day history of intermittently productive cough, progressed to body aches, fever (highest was 100F), headache, sinus pain, photosensitivity, chills, sore throat. Cough elicits chest pain. Has tried DayQuil, NyQuil, left over Tussionex, Mucinex DM. Denies shob, wheezing, n/v, abdominal pain, rashes. Denies smoking cigarettes.  Brian Alvarez has a current medication list which includes the following prescription(s): albuterol, diphenhydramine, epinephrine, fluticasone furoate-vilanterol, hydroxyzine, ibuprofen, levocetirizine, montelukast, multivitamin with minerals, sertraline, tramadol, and amphetamine-dextroamphetamine. Also is allergic to penicillins.  Brian Alvarez  has a past medical history of Allergy; Anemia; Asthma; Carpal tunnel syndrome; and Carpal tunnel syndrome. Also  has no past surgical history on file.  Objective:   Vitals: BP 136/74   Pulse (!) 112   Temp 98.2 F (36.8 C) (Oral)   Resp 18   Ht 5' 9.5" (1.765 m)   Wt (!) 304 lb (137.9 kg)   SpO2 97%   PF 400 L/min   BMI 44.25 kg/m   Peak Flow was 340 prior to nebulizer treatment. It was 400 afterwards.  Pulse was 88 on recheck by PA-Lisaanne Lawrie at 11:54.  BP Readings from Last 3 Encounters:  10/16/16 (!) 150/80  08/07/16 110/78  06/26/16 126/80    Physical Exam  Constitutional: He is oriented to person, place, and time. He appears well-developed and well-nourished.  HENT:  TM's intact bilaterally, no effusions or erythema. Nasal turbinates pink and moist, nasal passages patent. No sinus tenderness. Oropharynx clear, mucous membranes moist, dentition in good repair.  Eyes: Pupils are equal, round, and reactive to light. Right eye exhibits no discharge. Left eye exhibits no discharge.  Neck: Normal  range of motion. Neck supple.  Cardiovascular: Normal rate, regular rhythm and intact distal pulses.  Exam reveals no gallop and no friction rub.   No murmur heard. Pulmonary/Chest: No respiratory distress. He has wheezes (bibasilar). He has no rales.  Lymphadenopathy:    He has cervical adenopathy (R>L).  Neurological: He is alert and oriented to person, place, and time.  Skin: Skin is warm and dry.   Assessment and Plan :   1. Viral syndrome 2. Body aches 3. Cough 4. Wheezing 5. Extrinsic asthma without complication, unspecified asthma severity, unspecified whether persistent - I suspect patient has the flu. Offered supportive care and recommended patient start scheduling albuterol inhaler. Patient provided with script for azithromycin if no improvement in 2-3 days. RTC 10/23/2016 if problems persist thereafter. Consider short steroid course.   Wallis BambergMario Alliyah Roesler, PA-C [facility] at [facility] [phone] 10/16/2016  11:38 AM

## 2016-10-19 ENCOUNTER — Observation Stay (HOSPITAL_COMMUNITY)
Admission: EM | Admit: 2016-10-19 | Discharge: 2016-10-20 | Disposition: A | Discharge status: Home / Self Care | Payer: BLUE CROSS/BLUE SHIELD | Attending: Internal Medicine | Admitting: Internal Medicine

## 2016-10-19 ENCOUNTER — Ambulatory Visit (INDEPENDENT_AMBULATORY_CARE_PROVIDER_SITE_OTHER): Payer: BLUE CROSS/BLUE SHIELD | Admitting: Physician Assistant

## 2016-10-19 ENCOUNTER — Encounter (HOSPITAL_COMMUNITY): Payer: Self-pay | Admitting: Emergency Medicine

## 2016-10-19 ENCOUNTER — Ambulatory Visit (INDEPENDENT_AMBULATORY_CARE_PROVIDER_SITE_OTHER): Payer: BLUE CROSS/BLUE SHIELD

## 2016-10-19 VITALS — BP 168/98 | HR 105 | Temp 99.8°F | Resp 24 | Ht 69.0 in | Wt 303.8 lb

## 2016-10-19 DIAGNOSIS — R059 Cough, unspecified: Secondary | ICD-10-CM

## 2016-10-19 DIAGNOSIS — R0602 Shortness of breath: Secondary | ICD-10-CM | POA: Diagnosis not present

## 2016-10-19 DIAGNOSIS — R05 Cough: Secondary | ICD-10-CM

## 2016-10-19 DIAGNOSIS — R062 Wheezing: Secondary | ICD-10-CM

## 2016-10-19 DIAGNOSIS — F909 Attention-deficit hyperactivity disorder, unspecified type: Secondary | ICD-10-CM | POA: Diagnosis present

## 2016-10-19 DIAGNOSIS — J45901 Unspecified asthma with (acute) exacerbation: Secondary | ICD-10-CM | POA: Diagnosis not present

## 2016-10-19 DIAGNOSIS — E860 Dehydration: Secondary | ICD-10-CM | POA: Diagnosis not present

## 2016-10-19 DIAGNOSIS — J302 Other seasonal allergic rhinitis: Secondary | ICD-10-CM | POA: Diagnosis not present

## 2016-10-19 DIAGNOSIS — J4521 Mild intermittent asthma with (acute) exacerbation: Secondary | ICD-10-CM | POA: Diagnosis not present

## 2016-10-19 DIAGNOSIS — J159 Unspecified bacterial pneumonia: Secondary | ICD-10-CM | POA: Diagnosis not present

## 2016-10-19 DIAGNOSIS — R6889 Other general symptoms and signs: Secondary | ICD-10-CM

## 2016-10-19 DIAGNOSIS — E872 Acidosis: Secondary | ICD-10-CM | POA: Diagnosis not present

## 2016-10-19 DIAGNOSIS — R110 Nausea: Secondary | ICD-10-CM | POA: Diagnosis not present

## 2016-10-19 DIAGNOSIS — J029 Acute pharyngitis, unspecified: Secondary | ICD-10-CM | POA: Diagnosis not present

## 2016-10-19 DIAGNOSIS — J189 Pneumonia, unspecified organism: Secondary | ICD-10-CM

## 2016-10-19 DIAGNOSIS — J181 Lobar pneumonia, unspecified organism: Secondary | ICD-10-CM | POA: Diagnosis not present

## 2016-10-19 DIAGNOSIS — Z7951 Long term (current) use of inhaled steroids: Secondary | ICD-10-CM | POA: Insufficient documentation

## 2016-10-19 DIAGNOSIS — Z6841 Body Mass Index (BMI) 40.0 and over, adult: Secondary | ICD-10-CM | POA: Insufficient documentation

## 2016-10-19 DIAGNOSIS — Z79899 Other long term (current) drug therapy: Secondary | ICD-10-CM | POA: Diagnosis not present

## 2016-10-19 DIAGNOSIS — F329 Major depressive disorder, single episode, unspecified: Secondary | ICD-10-CM | POA: Diagnosis not present

## 2016-10-19 DIAGNOSIS — R509 Fever, unspecified: Secondary | ICD-10-CM

## 2016-10-19 DIAGNOSIS — Z87891 Personal history of nicotine dependence: Secondary | ICD-10-CM | POA: Diagnosis not present

## 2016-10-19 DIAGNOSIS — J45909 Unspecified asthma, uncomplicated: Secondary | ICD-10-CM | POA: Diagnosis present

## 2016-10-19 LAB — POCT CBC
Granulocyte percent: 86.1 %G — AB (ref 37–80)
HEMATOCRIT: 42.1 % — AB (ref 43.5–53.7)
Hemoglobin: 14.9 g/dL (ref 14.1–18.1)
LYMPH, POC: 0.9 (ref 0.6–3.4)
MCH, POC: 29.3 pg (ref 27–31.2)
MCHC: 35.4 g/dL (ref 31.8–35.4)
MCV: 82.6 fL (ref 80–97)
MID (cbc): 0.5 (ref 0–0.9)
MPV: 7.6 fL (ref 0–99.8)
POC GRANULOCYTE: 8.8 — AB (ref 2–6.9)
POC LYMPH %: 8.8 % — AB (ref 10–50)
POC MID %: 5.1 % (ref 0–12)
Platelet Count, POC: 170 10*3/uL (ref 142–424)
RBC: 5.09 M/uL (ref 4.69–6.13)
RDW, POC: 12.7 %
WBC: 10.2 10*3/uL (ref 4.6–10.2)

## 2016-10-19 LAB — POCT RAPID STREP A (OFFICE): Rapid Strep A Screen: NEGATIVE

## 2016-10-19 LAB — I-STAT CG4 LACTIC ACID, ED: Lactic Acid, Venous: 2.61 mmol/L (ref 0.5–1.9)

## 2016-10-19 LAB — URINALYSIS, ROUTINE W REFLEX MICROSCOPIC
BILIRUBIN URINE: NEGATIVE
Glucose, UA: NEGATIVE mg/dL
Hgb urine dipstick: NEGATIVE
Ketones, ur: NEGATIVE mg/dL
LEUKOCYTES UA: NEGATIVE
Nitrite: NEGATIVE
Protein, ur: NEGATIVE mg/dL
Specific Gravity, Urine: 1.002 — ABNORMAL LOW (ref 1.005–1.030)
pH: 6 (ref 5.0–8.0)

## 2016-10-19 LAB — LACTIC ACID, PLASMA
LACTIC ACID, VENOUS: 2.7 mmol/L — AB (ref 0.5–1.9)
Lactic Acid, Venous: 3.5 mmol/L (ref 0.5–1.9)

## 2016-10-19 LAB — COMPREHENSIVE METABOLIC PANEL
ALT: 44 U/L (ref 17–63)
AST: 47 U/L — AB (ref 15–41)
Albumin: 4 g/dL (ref 3.5–5.0)
Alkaline Phosphatase: 62 U/L (ref 38–126)
Anion gap: 9 (ref 5–15)
BILIRUBIN TOTAL: 0.8 mg/dL (ref 0.3–1.2)
BUN: 11 mg/dL (ref 6–20)
CHLORIDE: 101 mmol/L (ref 101–111)
CO2: 23 mmol/L (ref 22–32)
CREATININE: 1.03 mg/dL (ref 0.61–1.24)
Calcium: 8.3 mg/dL — ABNORMAL LOW (ref 8.9–10.3)
GFR calc Af Amer: >60 mL/min (ref >60)
Glucose, Bld: 164 mg/dL — ABNORMAL HIGH (ref 65–99)
Potassium: 3.7 mmol/L (ref 3.5–5.1)
Sodium: 133 mmol/L — ABNORMAL LOW (ref 135–145)
Total Protein: 7.1 g/dL (ref 6.5–8.1)

## 2016-10-19 LAB — LIPASE, BLOOD: LIPASE: 17 U/L (ref 11–51)

## 2016-10-19 LAB — INFLUENZA PANEL BY PCR (TYPE A & B)
INFLAPCR: NEGATIVE
Influenza B By PCR: NEGATIVE

## 2016-10-19 MED ORDER — LEVOCETIRIZINE DIHYDROCHLORIDE 5 MG PO TABS: 5.0000 mg | ORAL_TABLET | Freq: Every evening | ORAL | Status: DC

## 2016-10-19 MED ORDER — SODIUM CHLORIDE 0.9% FLUSH
3.0000 mL | Freq: Two times a day (BID) | INTRAVENOUS | Status: DC
  Administered 2016-10-19 – 2016-10-20 (×2): 3 mL via INTRAVENOUS

## 2016-10-19 MED ORDER — ALBUTEROL SULFATE (2.5 MG/3ML) 0.083% IN NEBU
2.5000 mg | INHALATION_SOLUTION | Freq: Once | RESPIRATORY_TRACT | Status: AC
  Administered 2016-10-19: 2.5 mg via RESPIRATORY_TRACT

## 2016-10-19 MED ORDER — ALBUTEROL SULFATE (2.5 MG/3ML) 0.083% IN NEBU
2.5000 mg | INHALATION_SOLUTION | RESPIRATORY_TRACT | Status: DC
  Administered 2016-10-19 – 2016-10-20 (×4): 2.5 mg via RESPIRATORY_TRACT
  Filled 2016-10-19 (×4): qty 3

## 2016-10-19 MED ORDER — ENOXAPARIN SODIUM 40 MG/0.4ML ~~LOC~~ SOLN
40.0000 mg | SUBCUTANEOUS | Status: DC
  Administered 2016-10-19: 40 mg via SUBCUTANEOUS
  Filled 2016-10-19: qty 0.4

## 2016-10-19 MED ORDER — PREDNISONE 50 MG PO TABS
50.0000 mg | ORAL_TABLET | Freq: Every day | ORAL | Status: DC
  Administered 2016-10-20: 50 mg via ORAL
  Filled 2016-10-19: qty 1

## 2016-10-19 MED ORDER — LORATADINE 10 MG PO TABS: 10.0000 mg | ORAL_TABLET | Freq: Every evening | ORAL | Status: DC

## 2016-10-19 MED ORDER — METHYLPREDNISOLONE SODIUM SUCC 125 MG IJ SOLR
125.0000 mg | Freq: Once | INTRAMUSCULAR | Status: AC
  Administered 2016-10-19: 125 mg via INTRAVENOUS
  Filled 2016-10-19: qty 2

## 2016-10-19 MED ORDER — LEVOFLOXACIN IN D5W 750 MG/150ML IV SOLN
750.0000 mg | Freq: Once | INTRAVENOUS | Status: AC
  Administered 2016-10-19: 750 mg via INTRAVENOUS
  Filled 2016-10-19: qty 150

## 2016-10-19 MED ORDER — FLUTICASONE FUROATE-VILANTEROL 200-25 MCG/INH IN AEPB
1.0000 | INHALATION_SPRAY | Freq: Every day | RESPIRATORY_TRACT | Status: DC
Start: 2016-10-19 — End: 2016-10-20
  Administered 2016-10-20: 1 via RESPIRATORY_TRACT
  Filled 2016-10-19: qty 28

## 2016-10-19 MED ORDER — SODIUM CHLORIDE 0.9 % IV BOLUS (SEPSIS)
1000.0000 mL | Freq: Once | INTRAVENOUS | Status: AC
  Administered 2016-10-19: 1000 mL via INTRAVENOUS

## 2016-10-19 MED ORDER — ONDANSETRON 4 MG PO TBDP
4.0000 mg | ORAL_TABLET | Freq: Once | ORAL | Status: AC
  Administered 2016-10-19: 4 mg via ORAL

## 2016-10-19 MED ORDER — ALBUTEROL (5 MG/ML) CONTINUOUS INHALATION SOLN
10.0000 mg/h | INHALATION_SOLUTION | Freq: Once | RESPIRATORY_TRACT | Status: AC
  Administered 2016-10-19: 10 mg/h via RESPIRATORY_TRACT
  Filled 2016-10-19: qty 20

## 2016-10-19 MED ORDER — SERTRALINE HCL 50 MG PO TABS
50.0000 mg | ORAL_TABLET | Freq: Every day | ORAL | Status: DC
  Administered 2016-10-19 – 2016-10-20 (×2): 50 mg via ORAL
  Filled 2016-10-19 (×2): qty 1

## 2016-10-19 MED ORDER — LEVOFLOXACIN 750 MG PO TABS
750.0000 mg | ORAL_TABLET | ORAL | Status: DC
  Administered 2016-10-20: 750 mg via ORAL
  Filled 2016-10-19: qty 1

## 2016-10-19 MED ORDER — ACETAMINOPHEN 325 MG PO TABS: 650.0000 mg | ORAL_TABLET | Freq: Four times a day (QID) | ORAL | Status: DC | PRN

## 2016-10-19 MED ORDER — SODIUM CHLORIDE 0.9 % IV SOLN: 250.0000 mL | INTRAVENOUS | Status: DC | PRN

## 2016-10-19 MED ORDER — ACETAMINOPHEN 500 MG PO TABS
1000.0000 mg | ORAL_TABLET | Freq: Once | ORAL | Status: AC
  Administered 2016-10-19: 1000 mg via ORAL

## 2016-10-19 MED ORDER — IBUPROFEN 200 MG PO TABS
400.0000 mg | ORAL_TABLET | Freq: Once | ORAL | Status: AC
  Administered 2016-10-19: 400 mg via ORAL

## 2016-10-19 MED ORDER — IPRATROPIUM BROMIDE 0.02 % IN SOLN
0.5000 mg | Freq: Once | RESPIRATORY_TRACT | Status: AC
  Administered 2016-10-19: 0.5 mg via RESPIRATORY_TRACT

## 2016-10-19 MED ORDER — ALBUTEROL SULFATE (2.5 MG/3ML) 0.083% IN NEBU: 2.5000 mg | INHALATION_SOLUTION | Freq: Four times a day (QID) | RESPIRATORY_TRACT | 1 refills | Status: DC | PRN

## 2016-10-19 MED ORDER — SODIUM CHLORIDE 0.9% FLUSH: 3.0000 mL | INTRAVENOUS | Status: DC | PRN

## 2016-10-19 MED ORDER — HYDROXYZINE HCL 25 MG PO TABS: 12.5000 mg | ORAL_TABLET | Freq: Every evening | ORAL | Status: DC | PRN

## 2016-10-19 MED ORDER — MONTELUKAST SODIUM 10 MG PO TABS
10.0000 mg | ORAL_TABLET | Freq: Every day | ORAL | Status: DC
  Administered 2016-10-19: 10 mg via ORAL
  Filled 2016-10-19: qty 1

## 2016-10-19 MED ORDER — SODIUM CHLORIDE 0.9 % IV BOLUS (SEPSIS)
1000.0000 mL | Freq: Once | INTRAVENOUS | Status: AC
Start: 2016-10-19 — End: 2016-10-19
  Administered 2016-10-19: 1000 mL via INTRAVENOUS

## 2016-10-19 NOTE — ED Notes (Signed)
Pt is aware that urine is needed for sample, urinal at bedside. 

## 2016-10-19 NOTE — ED Notes (Signed)
Pt sts he still is not able to urinate at this time.

## 2016-10-19 NOTE — ED Triage Notes (Signed)
Patient went to Urgent care today and chest xray showed PNA.  Patient was given 1L NS and 3 neb treatments and referred here for further treatment.  Patient sent here with saline lock in right AC.

## 2016-10-19 NOTE — Progress Notes (Deleted)
Brian LernerJames Alvarez  MRN: [id number] DOB: [birth year]  Subjective:  Pt presents to clinic for a CPE.  Last dental exam:  Last vision exam: Last pap smear: Last mammogram: Last colonoscopy: Vaccinations      Tetanus      HPV      Zostavax  Exercise: Diet:  Patient Active Problem List   Diagnosis Date Noted  . Attention deficit hyperactivity disorder (ADHD) 04/29/2016  . Morbid obesity (HCC) 01/08/2016  . Seasonal allergies 09/12/2012  . Asthma 12/26/2011  . Carpal tunnel syndrome 11/11/2011    Current Outpatient Prescriptions on File Prior to Visit  Medication Sig Dispense Refill  . albuterol (VENTOLIN HFA) 108 (90 Base) MCG/ACT inhaler Inhale 2 puffs into the lungs every 6 hours as needed.  "OV NEEDED FOR ADDITIONAL REFILLS" 54 g 0  . azithromycin (ZITHROMAX) 250 MG tablet Start with 2 tablets today, then 1 daily thereafter. 6 tablet 0  . diphenhydrAMINE (SOMINEX) 25 MG tablet Take 25 mg by mouth 4 (four) times daily as needed.    Marland Kitchen. EPINEPHrine (EPI-PEN) 0.3 mg/0.3 mL DEVI Inject 0.3 mLs (0.3 mg total) into the muscle once. 2 Device 0  . Fluticasone Furoate-Vilanterol 200-25 MCG/INH AEPB Inhale into the lungs.    Marland Kitchen. HYDROcodone-homatropine (HYCODAN) 5-1.5 MG/5ML syrup Take 5 mLs by mouth at bedtime as needed. 100 mL 0  . hydrOXYzine (ATARAX/VISTARIL) 25 MG tablet Take 0.5-1 tablets (12.5-25 mg total) by mouth at bedtime as needed for itching. 30 tablet 0  . ibuprofen (ADVIL,MOTRIN) 400 MG tablet Take 400 mg by mouth every 6 (six) hours as needed.    Marland Kitchen. levocetirizine (XYZAL) 5 MG tablet Take 5 mg by mouth every evening.    . montelukast (SINGULAIR) 10 MG tablet Take one daily at bedtime 30 tablet 11  . Multiple Vitamins-Minerals (MULTIVITAMIN WITH MINERALS) tablet Take 1 tablet by mouth daily.    . pseudoephedrine (SUDAFED 12 HOUR) 120 MG 12 hr tablet Take 1 tablet (120 mg total) by mouth 2 (two) times daily. 30 tablet 3  . sertraline (ZOLOFT) 50 MG tablet Take 1 tablet (50 mg  total) by mouth daily. 90 tablet 3  . traMADol (ULTRAM) 50 MG tablet Take 1 tablet (50 mg total) by mouth every 8 (eight) hours as needed. 25 tablet 1  . amphetamine-dextroamphetamine (ADDERALL) 10 MG tablet Take 1 tablet (10 mg total) by mouth 2 (two) times daily with a meal. 60 tablet 0  . benzonatate (TESSALON) 100 MG capsule Take 1-2 capsules (100-200 mg total) by mouth 3 (three) times daily as needed. 60 capsule 0   No current facility-administered medications on file prior to visit.     Allergies  Allergen Reactions  . Penicillins     hives    Social History   Social History  . Marital status: Single    Spouse name: N/A  . Number of children: N/A  . Years of education: N/A   Social History Main Topics  . Smoking status: Former Games developermoker  . Smokeless tobacco: Never Used     Comment: has switched to electronic cigarette  . Alcohol use Yes     Comment: occassional  . Drug use: No  . Sexual activity: Yes   Other Topics Concern  . None   Social History Narrative  . None    No past surgical history on file.  Family History  Problem Relation Age of Onset  . Arthritis Maternal Grandmother   . Heart disease Maternal Grandfather  Review of Systems  Objective:  BP (!) 168/98 (BP Location: Right Arm, Patient Position: Sitting, Cuff Size: Large)   Pulse (!) 132   Temp (!) 103 F (39.4 C) (Oral)   Resp (!) 24   Ht 5\' 9"  (1.753 m)   Wt (!) 303 lb 12.8 oz (137.8 kg)   SpO2 92%   BMI 44.86 kg/m   Physical Exam No exam data present  Assessment and Plan :  No diagnosis found.  Benny Lennert PA-C  Primary Care at [facility] 10/19/2016 9:10 AM

## 2016-10-19 NOTE — H&P (Signed)
History and Physical    Brian Alvarez VWU:[id number] DOB: [birth year] DOA: 10/19/2016  PCP: Joaquin Courts, FNP Patient coming from: Home  Chief Complaint: Wheezing  HPI: Brian Alvarez is a 31 y.o. male with medical history significant of asthma, obesity, ADHD, depression. Symptoms started 5 days ago with cough, fever, malaise. Three days ago, he went to urgent care and was positive for influenza. He was treated supportively with cough medication and analgesics. He was not given Tamiflu. Symptoms worsened over the weekend with worsening dyspnea, coughing and wheezing. His prescribed medications were not helping with his symptoms. He returned for evaluation today at urgent care and was found to have bilateral infiltrates concerning for pneumonia and was sent to the ED for evaluation.  ED Course: Vitals: Afebrile with normal pulse, normal respirations, normotensive on room air at 100% oxygen Labs: Sodium of 133, lactic acid of 2.61 Imaging: Bilateral infiltrates on CXR Medications/Course: Levaquin, 1L NS bolus, methylprednisolone, albuteroll  Review of Systems: Review of Systems  Constitutional: Positive for chills, fever and malaise/fatigue.  Respiratory: Positive for cough, hemoptysis (streaky), shortness of breath and wheezing. Negative for sputum production.   Cardiovascular: Negative for chest pain.  Gastrointestinal: Negative for abdominal pain, constipation, diarrhea, nausea and vomiting.  All other systems reviewed and are negative.   Past Medical History:  Diagnosis Date  . Allergy   . Anemia   . Asthma   . Carpal tunnel syndrome   . Carpal tunnel syndrome     History reviewed. No pertinent surgical history.   reports that he has quit smoking. He has never used smokeless tobacco. He reports that he drinks alcohol. He reports that he does not use drugs.  Allergies  Allergen Reactions  . Penicillins     hives    Family History  Problem Relation Age of Onset  .  Arthritis Maternal Grandmother   . Heart disease Maternal Grandfather     Prior to Admission medications   Medication Sig Start Date End Date Taking? Authorizing Provider  albuterol (VENTOLIN HFA) 108 (90 Base) MCG/ACT inhaler Inhale 2 puffs into the lungs every 6 hours as needed.  "OV NEEDED FOR ADDITIONAL REFILLS" 04/27/16   Doyle Askew, FNP  amphetamine-dextroamphetamine (ADDERALL) 10 MG tablet Take 1 tablet (10 mg total) by mouth 2 (two) times daily with a meal. 08/07/16 09/06/16  Doristine Bosworth, MD  azithromycin (ZITHROMAX) 250 MG tablet Start with 2 tablets today, then 1 daily thereafter. 10/16/16   Wallis Bamberg, PA-C  benzonatate (TESSALON) 100 MG capsule Take 1-2 capsules (100-200 mg total) by mouth 3 (three) times daily as needed. 10/16/16   Wallis Bamberg, PA-C  diphenhydrAMINE (SOMINEX) 25 MG tablet Take 25 mg by mouth 4 (four) times daily as needed.    Historical Provider, MD  EPINEPHrine (EPI-PEN) 0.3 mg/0.3 mL DEVI Inject 0.3 mLs (0.3 mg total) into the muscle once. 10/17/12   Elvina Sidle, MD  Fluticasone Furoate-Vilanterol 200-25 MCG/INH AEPB Inhale into the lungs.    Historical Provider, MD  HYDROcodone-homatropine (HYCODAN) 5-1.5 MG/5ML syrup Take 5 mLs by mouth at bedtime as needed. 10/16/16   Wallis Bamberg, PA-C  hydrOXYzine (ATARAX/VISTARIL) 25 MG tablet Take 0.5-1 tablets (12.5-25 mg total) by mouth at bedtime as needed for itching. 04/27/16   Doyle Askew, FNP  ibuprofen (ADVIL,MOTRIN) 400 MG tablet Take 400 mg by mouth every 6 (six) hours as needed.    Historical Provider, MD  levocetirizine (XYZAL) 5 MG tablet Take 5 mg by mouth every evening.  Historical Provider, MD  montelukast (SINGULAIR) 10 MG tablet Take one daily at bedtime 10/22/13   Collene GobbleSteven A Daub, MD  Multiple Vitamins-Minerals (MULTIVITAMIN WITH MINERALS) tablet Take 1 tablet by mouth daily.    Historical Provider, MD  pseudoephedrine (SUDAFED 12 HOUR) 120 MG 12 hr tablet Take 1 tablet (120 mg  total) by mouth 2 (two) times daily. 10/16/16   Wallis BambergMario Mani, PA-C  sertraline (ZOLOFT) 50 MG tablet Take 1 tablet (50 mg total) by mouth daily. 01/08/16   Elvina SidleKurt Lauenstein, MD  traMADol (ULTRAM) 50 MG tablet Take 1 tablet (50 mg total) by mouth every 8 (eight) hours as needed. 06/26/16   Tobey GrimJeffrey H Walden, MD    Physical Exam: Vitals:   10/19/16 1654 10/19/16 1700 10/19/16 1730 10/19/16 1751  BP:  101/61 124/60 124/60  Pulse: 87 86 95 107  Resp: 16 14 25 20   Temp:      TempSrc:      SpO2: 98% 100% 100% 100%  Weight:      Height:         Constitutional: NAD, calm, comfortable Eyes: PERRL, lids and conjunctivae normal ENMT: Mucous membranes are moist. Posterior pharynx clear of any exudate or lesions.Normal dentition.  Neck: normal, supple, no masses, no thyromegaly Respiratory: Mild wheezing in upper lung fields with mild-moderate wheezing in lower lung fields. Normal respiratory effort. Able to speak in complete, clear sentences. Mildly diminished lower lung sounds Cardiovascular: Regular rate and rhythm, no murmurs / rubs / gallops. No extremity edema. 2+ pedal pulses. Abdomen: no tenderness, no masses palpated. Obese. Bowel sounds positive.  Musculoskeletal: no clubbing / cyanosis. No joint deformity upper and lower extremities. Good ROM, no contractures. Normal muscle tone.  Skin: no rashes, lesions, ulcers. No induration Neurologic: CN 2-12 grossly intact. Sensation intact, DTR normal. Strength 5/5 in all 4.  Psychiatric: Normal judgment and insight. Alert and oriented x 3. Normal mood.    Labs on Admission: I have personally reviewed following labs and imaging studies  CBC:  Recent Labs Lab 10/19/16 0933  WBC 10.2  HGB 14.9  HCT 42.1*  MCV 82.6   Basic Metabolic Panel:  Recent Labs Lab 10/19/16 1336  NA 133*  K 3.7  CL 101  CO2 23  GLUCOSE 164*  BUN 11  CREATININE 1.03  CALCIUM 8.3*   GFR: Estimated Creatinine Clearance: 141.8 mL/min (by C-G formula based on  SCr of 1.03 mg/dL). Liver Function Tests:  Recent Labs Lab 10/19/16 1336  AST 47*  ALT 44  ALKPHOS 62  BILITOT 0.8  PROT 7.1  ALBUMIN 4.0    Recent Labs Lab 10/19/16 1336  LIPASE 17   No results for input(s): AMMONIA in the last 168 hours. Coagulation Profile: No results for input(s): INR, PROTIME in the last 168 hours. Cardiac Enzymes: No results for input(s): CKTOTAL, CKMB, CKMBINDEX, TROPONINI in the last 168 hours. BNP (last 3 results) No results for input(s): PROBNP in the last 8760 hours. HbA1C: No results for input(s): HGBA1C in the last 72 hours. CBG: No results for input(s): GLUCAP in the last 168 hours. Lipid Profile: No results for input(s): CHOL, HDL, LDLCALC, TRIG, CHOLHDL, LDLDIRECT in the last 72 hours. Thyroid Function Tests: No results for input(s): TSH, T4TOTAL, FREET4, T3FREE, THYROIDAB in the last 72 hours. Anemia Panel: No results for input(s): VITAMINB12, FOLATE, FERRITIN, TIBC, IRON, RETICCTPCT in the last 72 hours. Urine analysis:    Component Value Date/Time   BILIRUBINUR negative 11/28/2015 1317   KETONESUR negative 11/28/2015  1317   PROTEINUR trace (A) 11/28/2015 1317   UROBILINOGEN 0.2 11/28/2015 1317   NITRITE Negative 11/28/2015 1317   LEUKOCYTESUR Negative 11/28/2015 1317   Sepsis Labs: !!!!!!!!!!!!!!!!!!!!!!!!!!!!!!!!!!!!!!!!!!!! @LABRCNTIP (procalcitonin:4,lacticidven:4) )No results found for this or any previous visit (from the past 240 hour(s)).   Radiological Exams on Admission: Dg Chest 2 View  Result Date: 10/19/2016 CLINICAL DATA:  Cough and fever . EXAM: CHEST  2 VIEW COMPARISON:  No recent prior . FINDINGS: Mediastinum is normal. Multifocal bilateral pulmonary infiltrates are noted. Pulmonary infiltrates particularly prominent in the right upper lobe and right middle lobe. No pleural effusion or pneumothorax. Heart size normal. No acute bony abnormality. IMPRESSION: Multifocal bilateral pulmonary infiltrates consistent with  pneumonia. Electronically Signed   By: Maisie Fus  Register   On: 10/19/2016 10:07    EKG: Independently reviewed. Sinus tachycardia.  Assessment/Plan Principal Problem:   Community acquired bacterial pneumonia Active Problems:   Asthma   Seasonal allergies   Morbid obesity (HCC)   Attention deficit hyperactivity disorder (ADHD)   Asthma exacerbation   Community acquired pneumonia Influenza negative. On room air. Vitals stable. Normal work of breathing. -continue Levaquin as by mouth -urine/blood culture pending -repeat lactic acid after bolus, give more fluid if not corrected  Asthma exacerbation Has received multiple treatments but is still mildly symptomatic functionally -continue albuterol q4hrs -prednisone 50mg   Allergies -continue Atarax -continue Xyzal -continue Singular  ADHD -hold Adderall  Depression -continue Zoloft   DVT prophylaxis: Lovenox Code Status: Full code Family Communication: Fiance at bedside Disposition Plan: Discharge tomorrow Consults called: None Admission status: Observation, medical floor   Jacquelin Hawking, MD [facility] Pager [phone]  If 7PM-7AM, please contact night-coverage [email or web address] Password [email or web address]  10/19/2016, 5:59 PM

## 2016-10-19 NOTE — Patient Instructions (Addendum)
Pt was seen in our office with right middle and lower lobe pneumonia.  He was given 1 L NS and 3 nebs).  He needs further evaluation for possible sepsis and likely continued nebs,    IF you received an x-ray today, you will receive an invoice from [facility]. Please contact [facility] at [phone] with questions or concerns regarding your invoice.   IF you received labwork today, you will receive an invoice from [facility]. Please contact [facility] at [phone] with questions or concerns regarding your invoice.   Our billing staff will not be able to assist you with questions regarding bills from these companies.  You will be contacted with the lab results as soon as they are available. The fastest way to get your results is to activate your My Chart account. Instructions are located on the last page of this paperwork. If you have not heard from us regarding the results in 2 weeks, please contact this office.

## 2016-10-19 NOTE — Progress Notes (Addendum)
CRITICAL VALUE ALERT  Critical value received:  Lactic acid 2.7  Date of notification: 10/19/2016  Time of notification:  2005  Critical value read back:Yes.    Nurse who received alert:  Francesca OmanKatie Richardson RN  MD notified (1st page):  Donnamarie PoagK. Kirby NP  Time of first page: 2006  MD notified (2nd page): Donnamarie PoagK Kirby NP  Time of second page: 2058  Responding MD:  Donnamarie PoagK Kirby NP  Time MD responded:  3295:  2058

## 2016-10-19 NOTE — Progress Notes (Signed)
Brian Alvarez  MRN: [id number] DOB: 1986-08-14  Subjective:  Pt presents to clinic for a recheck - he was seen 1/27 with likely flu and symptomatic treatment was done at that time due to outside of timeframe of flu treatment.  Since then the symptoms have continued to get worse.  He is having SOB and wheezing and using the albuterol without much relief.  He has a cough that is leading to post-tussive emesis.  He is still having high fevers and myalgias.  He has started to have a little nausea and is not drinking well and feels dehydrated.  Review of Systems  Constitutional: Positive for chills and fever (103 Tmax).  HENT: Positive for congestion, rhinorrhea (small amount) and sore throat.   Respiratory: Positive for choking, shortness of breath and wheezing (albuterol not helping much).        H.o asthma, nonsmoker  Gastrointestinal: Positive for nausea and vomiting (post-tussive emesis).  Musculoskeletal: Positive for myalgias.  Neurological: Positive for headaches.    Patient Active Problem List   Diagnosis Date Noted  . Attention deficit hyperactivity disorder (ADHD) 04/29/2016  . Morbid obesity (HCC) 01/08/2016  . Seasonal allergies 09/12/2012  . Asthma 12/26/2011  . Carpal tunnel syndrome 11/11/2011    Current Outpatient Prescriptions on File Prior to Visit  Medication Sig Dispense Refill  . albuterol (VENTOLIN HFA) 108 (90 Base) MCG/ACT inhaler Inhale 2 puffs into the lungs every 6 hours as needed.  "OV NEEDED FOR ADDITIONAL REFILLS" 54 g 0  . azithromycin (ZITHROMAX) 250 MG tablet Start with 2 tablets today, then 1 daily thereafter. 6 tablet 0  . diphenhydrAMINE (SOMINEX) 25 MG tablet Take 25 mg by mouth 4 (four) times daily as needed.    Marland Kitchen EPINEPHrine (EPI-PEN) 0.3 mg/0.3 mL DEVI Inject 0.3 mLs (0.3 mg total) into the muscle once. 2 Device 0  . Fluticasone Furoate-Vilanterol 200-25 MCG/INH AEPB Inhale into the lungs.    Marland Kitchen HYDROcodone-homatropine (HYCODAN) 5-1.5 MG/5ML syrup  Take 5 mLs by mouth at bedtime as needed. 100 mL 0  . hydrOXYzine (ATARAX/VISTARIL) 25 MG tablet Take 0.5-1 tablets (12.5-25 mg total) by mouth at bedtime as needed for itching. 30 tablet 0  . ibuprofen (ADVIL,MOTRIN) 400 MG tablet Take 400 mg by mouth every 6 (six) hours as needed.    Marland Kitchen levocetirizine (XYZAL) 5 MG tablet Take 5 mg by mouth every evening.    . montelukast (SINGULAIR) 10 MG tablet Take one daily at bedtime 30 tablet 11  . Multiple Vitamins-Minerals (MULTIVITAMIN WITH MINERALS) tablet Take 1 tablet by mouth daily.    . pseudoephedrine (SUDAFED 12 HOUR) 120 MG 12 hr tablet Take 1 tablet (120 mg total) by mouth 2 (two) times daily. 30 tablet 3  . sertraline (ZOLOFT) 50 MG tablet Take 1 tablet (50 mg total) by mouth daily. 90 tablet 3  . traMADol (ULTRAM) 50 MG tablet Take 1 tablet (50 mg total) by mouth every 8 (eight) hours as needed. 25 tablet 1  . amphetamine-dextroamphetamine (ADDERALL) 10 MG tablet Take 1 tablet (10 mg total) by mouth 2 (two) times daily with a meal. 60 tablet 0  . benzonatate (TESSALON) 100 MG capsule Take 1-2 capsules (100-200 mg total) by mouth 3 (three) times daily as needed. 60 capsule 0   No current facility-administered medications on file prior to visit.     Allergies  Allergen Reactions  . Penicillins     hives    Pt patients past, family and social history were reviewed and  updated.   Objective:  BP (!) 168/98 (BP Location: Right Arm, Patient Position: Sitting, Cuff Size: Large)   Pulse (!) 132   Temp (!) 103 F (39.4 C) (Oral)   Resp (!) 24   Ht 5\' 9"  (1.753 m)   Wt (!) 303 lb 12.8 oz (137.8 kg)   SpO2 92%   BMI 44.86 kg/m   Physical Exam  Constitutional: He is oriented to person, place, and time and well-developed, well-nourished, and in no distress.  Pt appears to feel poorly  HENT:  Head: Normocephalic and atraumatic.  Right Ear: Hearing, tympanic membrane, external ear and ear canal normal.  Left Ear: Hearing, tympanic  membrane, external ear and ear canal normal.  Nose: Nose normal.  Mouth/Throat: Uvula is midline, oropharynx is clear and moist and mucous membranes are normal.  Eyes: Conjunctivae are normal.  Neck: Normal range of motion.  Cardiovascular: Normal rate, regular rhythm and normal heart sounds.   Pulmonary/Chest: He is in respiratory distress (mild - loud breathing and quick). He has wheezes (all lung fields).  Improved wheezing after albuterol pt pt does not feel much better - after he is gien 1 L fluid - the wheezing worsens - entire lung field expiratory wheezing  Lymphadenopathy:       Head (right side): No tonsillar adenopathy present.       Head (left side): No tonsillar adenopathy present.    He has no cervical adenopathy.       Right: No supraclavicular adenopathy present.       Left: No supraclavicular adenopathy present.  Neurological: He is alert and oriented to person, place, and time. Gait normal.  Skin: Skin is warm and dry.  Psychiatric: Mood, memory, affect and judgment normal.   Results for orders placed or performed in visit on 10/19/16  POCT CBC  Result Value Ref Range   WBC 10.2 4.6 - 10.2 K/uL   Lymph, poc 0.9 0.6 - 3.4   POC LYMPH PERCENT 8.8 (A) 10 - 50 %L   MID (cbc) 0.5 0 - 0.9   POC MID % 5.1 0 - 12 %M   POC Granulocyte 8.8 (A) 2 - 6.9   Granulocyte percent 86.1 (A) 37 - 80 %G   RBC 5.09 4.69 - 6.13 M/uL   Hemoglobin 14.9 14.1 - 18.1 g/dL   HCT, POC 14.742.1 (A) 82.943.5 - 53.7 %   MCV 82.6 80 - 97 fL   MCH, POC 29.3 27 - 31.2 pg   MCHC 35.4 31.8 - 35.4 g/dL   RDW, POC 56.212.7 %   Platelet Count, POC 170 142 - 424 K/uL   MPV 7.6 0 - 99.8 fL  POCT rapid strep A  Result Value Ref Range   Rapid Strep A Screen Negative Negative    Dg Chest 2 View  Result Date: 10/19/2016 CLINICAL DATA:  Cough and fever . EXAM: CHEST  2 VIEW COMPARISON:  No recent prior . FINDINGS: Mediastinum is normal. Multifocal bilateral pulmonary infiltrates are noted. Pulmonary infiltrates  particularly prominent in the right upper lobe and right middle lobe. No pleural effusion or pneumothorax. Heart size normal. No acute bony abnormality. IMPRESSION: Multifocal bilateral pulmonary infiltrates consistent with pneumonia. Electronically Signed   By: Maisie Fushomas  Register   On: 10/19/2016 10:07   Orthostatic VS for the past 24 hrs:  BP- Lying Pulse- Lying BP- Sitting Pulse- Sitting BP- Standing at 0 minutes Pulse- Standing at 0 minutes  10/19/16 0944 (!) 150/92 114 136/84 128 140/84 134  Assessment and Plan :  Cough - Plan: POCT CBC, DG Chest 2 View, DISCONTINUED: albuterol (PROVENTIL) (2.5 MG/3ML) 0.083% nebulizer solution   SOB (shortness of breath) - Plan: DISCONTINUED: albuterol (PROVENTIL) (2.5 MG/3ML) 0.083% nebulizer solution - improved slightly with nubulizer x3  Wheezing - Plan: albuterol (PROVENTIL) (2.5 MG/3ML) 0.083% nebulizer solution 2.5 mg, ipratropium (ATROVENT) nebulizer solution 0.5 mg, albuterol (PROVENTIL) (2.5 MG/3ML) 0.083% nebulizer solution 2.5 mg, DISCONTINUED: albuterol (PROVENTIL) (2.5 MG/3ML) 0.083% nebulizer solution - slightly improved after 3 nebs but after worsens after fluids - to ED for further neb and possible solumedrol injections  Flu-like symptoms - Plan: Orthostatic vital signs, albuterol (PROVENTIL) (2.5 MG/3ML) 0.083% nebulizer solution 2.5 mg  Nausea without vomiting - Plan: ondansetron (ZOFRAN-ODT) disintegrating tablet 4 mg - improved with medications  Sore throat - Plan: POCT rapid strep A - neg rapid strep  Fever and chills - Plan: acetaminophen (TYLENOL) tablet 1,000 mg, ibuprofen (ADVIL,MOTRIN) tablet 400 mg - improved with tylenol and motrin  Right middle and lower lobe PNA - no more zithromax - he will need something stronger - he will receive this in the ED   Called and spoke with nurse 1st at [facility] ED - he will go there with a saline lock for further evaluation for sepsis and control of his wheezing.  Benny Lennert PA-C  Primary Care  at [facility] 10/19/2016 11:43 AM

## 2016-10-19 NOTE — ED Provider Notes (Signed)
[facility]-EMERGENCY DEPT Provider Note   CSN: [id number] Arrival date & time: 10/19/16  1224     History   Chief Complaint Chief Complaint  Patient presents with  . sent from Urgent care for PNA    HPI Jamiere Gulas is a 31 y.o. male.  HPI Akaash Vandewater is a 31 y.o. male with PMH significant for asthma who presents with 4 days of productive cough (occasional bloody sputum), wheezing, shortness of breath with associated fever (max temp today 103) and myalgias.  He was seen at his PCP today, CXR found multifocal PNA.  He received 1L NS and 3 nebulizers.  At the office he was febrile with a temp of 103, HR 132, RR 24, and oxygen saturations of 92%.  He states he feels somewhat improved, but continues to endorse wheezing and shortness of breath. He also received Motrin and Tylenol.  Blood pressure 128/76, pulse (!) 58, temperature 99 F (37.2 C), temperature source Oral, resp. rate 16, height 5\' 9"  (1.753 m), weight (!) 137.4 kg, SpO2 96 %.   Past Medical History:  Diagnosis Date  . Allergy   . Anemia   . Asthma   . Carpal tunnel syndrome   . Carpal tunnel syndrome     Patient Active Problem List   Diagnosis Date Noted  . Community acquired bacterial pneumonia 10/19/2016  . Attention deficit hyperactivity disorder (ADHD) 04/29/2016  . Morbid obesity (HCC) 01/08/2016  . Seasonal allergies 09/12/2012  . Asthma 12/26/2011  . Carpal tunnel syndrome 11/11/2011    History reviewed. No pertinent surgical history.     Home Medications    Prior to Admission medications   Medication Sig Start Date End Date Taking? Authorizing Provider  albuterol (VENTOLIN HFA) 108 (90 Base) MCG/ACT inhaler Inhale 2 puffs into the lungs every 6 hours as needed.  "OV NEEDED FOR ADDITIONAL REFILLS" 04/27/16   Doyle Askew, FNP  amphetamine-dextroamphetamine (ADDERALL) 10 MG tablet Take 1 tablet (10 mg total) by mouth 2 (two) times daily with a meal. 08/07/16 09/06/16  Doristine Bosworth, MD    azithromycin (ZITHROMAX) 250 MG tablet Start with 2 tablets today, then 1 daily thereafter. 10/16/16   Wallis Bamberg, PA-C  benzonatate (TESSALON) 100 MG capsule Take 1-2 capsules (100-200 mg total) by mouth 3 (three) times daily as needed. 10/16/16   Wallis Bamberg, PA-C  diphenhydrAMINE (SOMINEX) 25 MG tablet Take 25 mg by mouth 4 (four) times daily as needed.    Historical Provider, MD  EPINEPHrine (EPI-PEN) 0.3 mg/0.3 mL DEVI Inject 0.3 mLs (0.3 mg total) into the muscle once. 10/17/12   Elvina Sidle, MD  Fluticasone Furoate-Vilanterol 200-25 MCG/INH AEPB Inhale into the lungs.    Historical Provider, MD  HYDROcodone-homatropine (HYCODAN) 5-1.5 MG/5ML syrup Take 5 mLs by mouth at bedtime as needed. 10/16/16   Wallis Bamberg, PA-C  hydrOXYzine (ATARAX/VISTARIL) 25 MG tablet Take 0.5-1 tablets (12.5-25 mg total) by mouth at bedtime as needed for itching. 04/27/16   Doyle Askew, FNP  ibuprofen (ADVIL,MOTRIN) 400 MG tablet Take 400 mg by mouth every 6 (six) hours as needed.    Historical Provider, MD  levocetirizine (XYZAL) 5 MG tablet Take 5 mg by mouth every evening.    Historical Provider, MD  montelukast (SINGULAIR) 10 MG tablet Take one daily at bedtime 10/22/13   Collene Gobble, MD  Multiple Vitamins-Minerals (MULTIVITAMIN WITH MINERALS) tablet Take 1 tablet by mouth daily.    Historical Provider, MD  pseudoephedrine (SUDAFED 12 HOUR) 120 MG 12  hr tablet Take 1 tablet (120 mg total) by mouth 2 (two) times daily. 10/16/16   Wallis BambergMario Mani, PA-C  sertraline (ZOLOFT) 50 MG tablet Take 1 tablet (50 mg total) by mouth daily. 01/08/16   Elvina SidleKurt Lauenstein, MD  traMADol (ULTRAM) 50 MG tablet Take 1 tablet (50 mg total) by mouth every 8 (eight) hours as needed. 06/26/16   Tobey GrimJeffrey H Walden, MD    Family History Family History  Problem Relation Age of Onset  . Arthritis Maternal Grandmother   . Heart disease Maternal Grandfather     Social History Social History  Substance Use Topics  . Smoking status:  Former Games developermoker  . Smokeless tobacco: Never Used     Comment: has switched to electronic cigarette  . Alcohol use Yes     Comment: occassional     Allergies   Penicillins   Review of Systems Review of Systems All other systems negative unless otherwise stated in HPI   Physical Exam Updated Vital Signs BP 124/60 (BP Location: Left Arm)   Pulse 107   Temp 99 F (37.2 C) (Oral)   Resp 20   Ht 5\' 9"  (1.753 m)   Wt (!) 137.4 kg   SpO2 100%   BMI 44.75 kg/m   Physical Exam  Constitutional: He is oriented to person, place, and time. He appears well-developed and well-nourished.  Non-toxic appearance. He does not have a sickly appearance. He does not appear ill.  HENT:  Head: Normocephalic and atraumatic.  Mouth/Throat: Oropharynx is clear and moist.  Eyes: Conjunctivae are normal. Pupils are equal, round, and reactive to light.  Neck: Normal range of motion. Neck supple.  Cardiovascular: Normal rate and regular rhythm.   Pulmonary/Chest: Effort normal. No accessory muscle usage or stridor. No respiratory distress. He has wheezes. He has rhonchi. He has no rales.  Audible expiratory wheezes.  Abdominal: Soft. Bowel sounds are normal. He exhibits no distension. There is no tenderness.  Musculoskeletal: Normal range of motion.  Lymphadenopathy:    He has no cervical adenopathy.  Neurological: He is alert and oriented to person, place, and time.  Speech clear without dysarthria.  Skin: Skin is warm and dry.  Psychiatric: He has a normal mood and affect. His behavior is normal.     ED Treatments / Results  Labs (all labs ordered are listed, but only abnormal results are displayed) Labs Reviewed  COMPREHENSIVE METABOLIC PANEL - Abnormal; Notable for the following:       Result Value   Sodium 133 (*)    Glucose, Bld 164 (*)    Calcium 8.3 (*)    AST 47 (*)    All other components within normal limits  I-STAT CG4 LACTIC ACID, ED - Abnormal; Notable for the following:     Lactic Acid, Venous 2.61 (*)    All other components within normal limits  CULTURE, BLOOD (ROUTINE X 2)  CULTURE, BLOOD (ROUTINE X 2)  URINE CULTURE  LIPASE, BLOOD  INFLUENZA PANEL BY PCR (TYPE A & B)  CBC WITH DIFFERENTIAL/PLATELET  URINALYSIS, ROUTINE W REFLEX MICROSCOPIC    EKG  EKG Interpretation None       Radiology Dg Chest 2 View  Result Date: 10/19/2016 CLINICAL DATA:  Cough and fever . EXAM: CHEST  2 VIEW COMPARISON:  No recent prior . FINDINGS: Mediastinum is normal. Multifocal bilateral pulmonary infiltrates are noted. Pulmonary infiltrates particularly prominent in the right upper lobe and right middle lobe. No pleural effusion or pneumothorax. Heart size normal.  No acute bony abnormality. IMPRESSION: Multifocal bilateral pulmonary infiltrates consistent with pneumonia. Electronically Signed   By: Maisie Fus  Register   On: 10/19/2016 10:07    Procedures Procedures (including critical care time)  Medications Ordered in ED Medications  methylPREDNISolone sodium succinate (SOLU-MEDROL) 125 mg/2 mL injection 125 mg (125 mg Intravenous Given 10/19/16 1637)  sodium chloride 0.9 % bolus 1,000 mL (1,000 mLs Intravenous New Bag/Given 10/19/16 1635)  albuterol (PROVENTIL,VENTOLIN) solution continuous neb (10 mg/hr Nebulization Given 10/19/16 1654)  levofloxacin (LEVAQUIN) IVPB 750 mg (0 mg Intravenous Stopped 10/19/16 1801)     Initial Impression / Assessment and Plan / ED Course  I have reviewed the triage vital signs and the nursing notes.  Pertinent labs & imaging results that were available during my care of the patient were reviewed by me and considered in my medical decision making (see chart for details).     Patient presents with findings c/w CAP.  Patient intially seen by PCP and found to be hypoxic, febrile, and tachycardic.  He received 1L NS and 3 nebulizers with some improvement.  CXR showed multifocal PNA.  POCT CBC showed no leukocytosis and stable hgb.  On  arrival to ED, vitals have stabilized.  However, he has audible wheezing and diffuse wheezing and rhonchi on exam with continued SOB.  Lactic elevated at 2.61.  Blood cultures pending.  Influenza negative. Patient is allergic to PCN; therefore started on IV Levaquin and receiving 1L NS, solumedrol, and CAT.  At this time, patient will need admission for CAP and likely asthma exacerbation.  Case has been discussed with Dr. Verdie Mosher who agrees with the above plan for admission.    Final Clinical Impressions(s) / ED Diagnoses   Final diagnoses:  Community acquired pneumonia, unspecified laterality  Exacerbation of asthma, unspecified asthma severity, unspecified whether persistent    New Prescriptions New Prescriptions   No medications on file     Cheri Fowler, PA-C 10/19/16 1803    Lavera Guise, MD 10/20/16 1135

## 2016-10-19 NOTE — Progress Notes (Deleted)
   Brian LernerJames Alvarez  MRN: [id number] DOB: Nov 12, 1985  Subjective:  Pt presents to clinic  Review of Systems  Patient Active Problem List   Diagnosis Date Noted  . Attention deficit hyperactivity disorder (ADHD) 04/29/2016  . Morbid obesity (HCC) 01/08/2016  . Seasonal allergies 09/12/2012  . Asthma 12/26/2011  . Carpal tunnel syndrome 11/11/2011    Current Outpatient Prescriptions on File Prior to Visit  Medication Sig Dispense Refill  . albuterol (VENTOLIN HFA) 108 (90 Base) MCG/ACT inhaler Inhale 2 puffs into the lungs every 6 hours as needed.  "OV NEEDED FOR ADDITIONAL REFILLS" 54 g 0  . azithromycin (ZITHROMAX) 250 MG tablet Start with 2 tablets today, then 1 daily thereafter. 6 tablet 0  . diphenhydrAMINE (SOMINEX) 25 MG tablet Take 25 mg by mouth 4 (four) times daily as needed.    Marland Kitchen. EPINEPHrine (EPI-PEN) 0.3 mg/0.3 mL DEVI Inject 0.3 mLs (0.3 mg total) into the muscle once. 2 Device 0  . Fluticasone Furoate-Vilanterol 200-25 MCG/INH AEPB Inhale into the lungs.    Marland Kitchen. HYDROcodone-homatropine (HYCODAN) 5-1.5 MG/5ML syrup Take 5 mLs by mouth at bedtime as needed. 100 mL 0  . hydrOXYzine (ATARAX/VISTARIL) 25 MG tablet Take 0.5-1 tablets (12.5-25 mg total) by mouth at bedtime as needed for itching. 30 tablet 0  . ibuprofen (ADVIL,MOTRIN) 400 MG tablet Take 400 mg by mouth every 6 (six) hours as needed.    Marland Kitchen. levocetirizine (XYZAL) 5 MG tablet Take 5 mg by mouth every evening.    . montelukast (SINGULAIR) 10 MG tablet Take one daily at bedtime 30 tablet 11  . Multiple Vitamins-Minerals (MULTIVITAMIN WITH MINERALS) tablet Take 1 tablet by mouth daily.    . pseudoephedrine (SUDAFED 12 HOUR) 120 MG 12 hr tablet Take 1 tablet (120 mg total) by mouth 2 (two) times daily. 30 tablet 3  . sertraline (ZOLOFT) 50 MG tablet Take 1 tablet (50 mg total) by mouth daily. 90 tablet 3  . traMADol (ULTRAM) 50 MG tablet Take 1 tablet (50 mg total) by mouth every 8 (eight) hours as needed. 25 tablet 1  .  amphetamine-dextroamphetamine (ADDERALL) 10 MG tablet Take 1 tablet (10 mg total) by mouth 2 (two) times daily with a meal. 60 tablet 0  . benzonatate (TESSALON) 100 MG capsule Take 1-2 capsules (100-200 mg total) by mouth 3 (three) times daily as needed. 60 capsule 0   No current facility-administered medications on file prior to visit.     Allergies  Allergen Reactions  . Penicillins     hives    Pt patients past, family and social history were reviewed and updated.   Objective:  BP (!) 168/98 (BP Location: Right Arm, Patient Position: Sitting, Cuff Size: Large)   Pulse (!) 132   Temp (!) 103 F (39.4 C) (Oral)   Resp (!) 24   Ht 5\' 9"  (1.753 m)   Wt (!) 303 lb 12.8 oz (137.8 kg)   SpO2 92%   BMI 44.86 kg/m   Physical Exam  Assessment and Plan :  No diagnosis found.  Benny LennertSarah Weber PA-C  Primary Care at [facility] 10/19/2016 9:10 AM

## 2016-10-20 DIAGNOSIS — J159 Unspecified bacterial pneumonia: Secondary | ICD-10-CM | POA: Diagnosis not present

## 2016-10-20 DIAGNOSIS — J45901 Unspecified asthma with (acute) exacerbation: Secondary | ICD-10-CM | POA: Diagnosis not present

## 2016-10-20 LAB — LACTIC ACID, PLASMA: LACTIC ACID, VENOUS: 1.8 mmol/L (ref 0.5–1.9)

## 2016-10-20 LAB — HIV ANTIBODY (ROUTINE TESTING W REFLEX): HIV SCREEN 4TH GENERATION: NONREACTIVE

## 2016-10-20 LAB — STREP PNEUMONIAE URINARY ANTIGEN: Strep Pneumo Urinary Antigen: NEGATIVE

## 2016-10-20 MED ORDER — LEVOFLOXACIN 500 MG PO TABS: 500.0000 mg | ORAL_TABLET | Freq: Every day | ORAL | 0 refills | Status: DC

## 2016-10-20 MED ORDER — PREDNISONE 10 MG PO TABS: ORAL_TABLET | ORAL | 0 refills | Status: DC

## 2016-10-20 MED ORDER — PANTOPRAZOLE SODIUM 40 MG PO TBEC: 40.0000 mg | DELAYED_RELEASE_TABLET | Freq: Every day | ORAL | 0 refills | Status: DC

## 2016-10-20 MED ORDER — LEVOFLOXACIN 500 MG PO TABS: 500.0000 mg | ORAL_TABLET | Freq: Every day | ORAL | Status: DC

## 2016-10-20 MED ORDER — SODIUM CHLORIDE 0.9 % IV BOLUS (SEPSIS)
1000.0000 mL | Freq: Once | INTRAVENOUS | Status: AC
  Administered 2016-10-20: 1000 mL via INTRAVENOUS

## 2016-10-20 MED ORDER — LEVOFLOXACIN IN D5W 750 MG/150ML IV SOLN: 750.0000 mg | INTRAVENOUS | Status: DC

## 2016-10-20 MED ORDER — ENOXAPARIN SODIUM 80 MG/0.8ML ~~LOC~~ SOLN: 70.0000 mg | SUBCUTANEOUS | Status: DC

## 2016-10-20 NOTE — Progress Notes (Addendum)
CRITICAL VALUE ALERT  Critical value received:  Lactic acid 3.5  Date of notification:  10/19/2016  Time of notification:  2359  Critical value read back:Yes.    Nurse who received alert:  Francesca OmanKatie Richardson RN  MD notified (1st page):  Donnamarie PoagK. Kirby NP  Time of first page:  0001  MD notified (2nd page):  Time of second page:  Responding MD:  Donnamarie PoagK Kirby NP  Time MD responded:  [phone]

## 2016-10-20 NOTE — Discharge Instructions (Signed)
Brian Alvarez was admitted to the Hospital on 10/19/2016 and Discharged on Discharge Date 10/20/2016 and should be excused from work/school   for 3   days starting 10/19/2016 , may return to work/school without any restrictions.  Call Lambert KetoAbraham Feliz MD, [facility] [phone] with questions.  Marinda ElkFELIZ ORTIZ, Lynden Carrithers M.D on 10/20/2016,at 9:28 AM  [facility] Office  [phone]

## 2016-10-20 NOTE — Discharge Summary (Signed)
Physician Discharge Summary  Brian Alvarez ZOX:[id number] DOB: 06-17-1986 DOA: 10/19/2016  PCP: Joaquin Courts, FNP  Admit date: 10/19/2016 Discharge date: 10/20/2016  Admitted From: home Disposition:  Home  Recommendations for Outpatient Follow-up:  1. Follow up with PCP in 1-2 weeks 2. Please obtain BMP/CBC in one week 3. Please follow up on the following pending results:  Home Health:No Equipment/Devices:none  Discharge Condition:stable CODE STATUS:full Diet recommendation: Heart Healthy  Brief/Interim Summary: 31 y.o. male with medical history significant of asthma, obesity, ADHD, depression. Symptoms started 5 days ago with cough, fever, malaise. Three days ago, he went to urgent care and was positive for influenza. He was treated supportively with cough medication and analgesics. He was not given Tamiflu. Symptoms worsened over the weekend with worsening dyspnea, coughing and wheezing. His prescribed medications were not helping with his symptoms. He returned for evaluation today at urgent care and was found to have bilateral infiltrates concerning for pneumonia and was sent to the ED for evaluation.  Discharge Diagnoses:  Principal Problem:   Community acquired bacterial pneumonia Active Problems:   Asthma   Seasonal allergies   Morbid obesity (HCC)   Attention deficit hyperactivity disorder (ADHD)   Asthma exacerbation  Community acquired pneumonia Influenza negative. Start on IV Levaquin on admission he defervesced he was feeling much better. He was changed to oral Levaquin, she will continue for 6 additional days an outpatient. Her lactic acidosis is probably a combination of hypoxia and hypovolemia. Does resolve with IV fluid hydration.  Asthma exacerbation Patient was wheezing on admission he was started on IV steroids by the next day this was resolved. He was changed to oral prednisone which she will continue as an outpatient.  Allergies No changes made to his  medication.  ADHD No changes made to his medication.  Depression -continue Zoloft   Discharge Instructions  Discharge Instructions    Diet - low sodium heart healthy    Complete by:  As directed    Increase activity slowly    Complete by:  As directed      Allergies as of 10/20/2016      Reactions   Penicillins    Hives Has patient had a PCN reaction causing immediate rash, facial/tongue/throat swelling, SOB or lightheadedness with hypotension: yes Has patient had a PCN reaction causing severe rash involving mucus membranes or skin necrosis: yes Has patient had a PCN reaction that required hospitalization: no Has patient had a PCN reaction occurring within the last 10 years: yes If all of the above answers are "NO", then may proceed with Cephalosporin use.   Rosemary Oil       Medication List    STOP taking these medications   azithromycin 250 MG tablet Commonly known as:  ZITHROMAX     TAKE these medications   acetaminophen 325 MG tablet Commonly known as:  TYLENOL Take 650 mg by mouth every 6 (six) hours as needed.   albuterol 108 (90 Base) MCG/ACT inhaler Commonly known as:  VENTOLIN HFA Inhale 2 puffs into the lungs every 6 hours as needed.  "OV NEEDED FOR ADDITIONAL REFILLS"   amphetamine-dextroamphetamine 10 MG tablet Commonly known as:  ADDERALL Take 1 tablet (10 mg total) by mouth 2 (two) times daily with a meal.   benzonatate 100 MG capsule Commonly known as:  TESSALON Take 1-2 capsules (100-200 mg total) by mouth 3 (three) times daily as needed.   EPINEPHrine 0.3 mg/0.3 mL Devi Commonly known as:  EPI-PEN Inject 0.3 mLs (0.3  mg total) into the muscle once.   fluticasone furoate-vilanterol 200-25 MCG/INH Aepb Commonly known as:  BREO ELLIPTA Inhale 1 puff into the lungs daily.   HYDROcodone-homatropine 5-1.5 MG/5ML syrup Commonly known as:  HYCODAN Take 5 mLs by mouth at bedtime as needed.   hydrOXYzine 25 MG tablet Commonly known as:   ATARAX/VISTARIL Take 0.5-1 tablets (12.5-25 mg total) by mouth at bedtime as needed for itching.   ibuprofen 200 MG tablet Commonly known as:  ADVIL,MOTRIN Take 600 mg by mouth every 6 (six) hours as needed.   levocetirizine 5 MG tablet Commonly known as:  XYZAL Take 5 mg by mouth every evening.   levofloxacin 500 MG tablet Commonly known as:  LEVAQUIN Take 1 tablet (500 mg total) by mouth daily. Start taking on:  10/21/2016   montelukast 10 MG tablet Commonly known as:  SINGULAIR Take one daily at bedtime   multivitamin with minerals tablet Take 1 tablet by mouth daily.   pantoprazole 40 MG tablet Commonly known as:  PROTONIX Take 1 tablet (40 mg total) by mouth daily.   predniSONE 10 MG tablet Commonly known as:  DELTASONE Takes 6 tablets for 1 days, then 5 tablets for 1 days, then 4 tablets for 1 days, then 3 tablets for 1 days, then 2 tabs for 1 days, then 1 tab for 1 days, and then stop.   pseudoephedrine 120 MG 12 hr tablet Commonly known as:  SUDAFED 12 HOUR Take 1 tablet (120 mg total) by mouth 2 (two) times daily.   sertraline 50 MG tablet Commonly known as:  ZOLOFT Take 1 tablet (50 mg total) by mouth daily.   traMADol 50 MG tablet Commonly known as:  ULTRAM Take 1 tablet (50 mg total) by mouth every 8 (eight) hours as needed.       Allergies  Allergen Reactions  . Penicillins     Hives Has patient had a PCN reaction causing immediate rash, facial/tongue/throat swelling, SOB or lightheadedness with hypotension: yes Has patient had a PCN reaction causing severe rash involving mucus membranes or skin necrosis: yes Has patient had a PCN reaction that required hospitalization: no Has patient had a PCN reaction occurring within the last 10 years: yes If all of the above answers are "NO", then may proceed with Cephalosporin use.  Marland Kitchen Rosemary Oil     Consultations:  None   Procedures/Studies: Dg Chest 2 View  Result Date: 10/19/2016 CLINICAL DATA:   Cough and fever . EXAM: CHEST  2 VIEW COMPARISON:  No recent prior . FINDINGS: Mediastinum is normal. Multifocal bilateral pulmonary infiltrates are noted. Pulmonary infiltrates particularly prominent in the right upper lobe and right middle lobe. No pleural effusion or pneumothorax. Heart size normal. No acute bony abnormality. IMPRESSION: Multifocal bilateral pulmonary infiltrates consistent with pneumonia. Electronically Signed   By: Maisie Fus  Register   On: 10/19/2016 10:07     Subjective: He relates he feels back to baseline no new complaints.  Discharge Exam: Vitals:   10/20/16 0443 10/20/16 0725  BP: (!) 146/79   Pulse: 87 84  Resp: 18 18  Temp: 97.7 F (36.5 C)    Vitals:   10/19/16 1858 10/20/16 0108 10/20/16 0443 10/20/16 0725  BP: 132/79 (!) 107/57 (!) 146/79   Pulse: (!) 109 100 87 84  Resp: 18 16 18 18   Temp: 97.8 F (36.6 C) 97.8 F (36.6 C) 97.7 F (36.5 C)   TempSrc: Oral Oral Oral   SpO2: 94% 95% 95% 98%  Weight:  Height:        General: Pt is alert, awake, not in acute distress Cardiovascular: RRR, S1/S2 +, no rubs, no gallops Respiratory: CTA bilaterally, no wheezing, no rhonchi Abdominal: Soft, NT, ND, bowel sounds + Extremities: no edema, no cyanosis    The results of significant diagnostics from this hospitalization (including imaging, microbiology, ancillary and laboratory) are listed below for reference.     Microbiology: No results found for this or any previous visit (from the past 240 hour(s)).   Labs: BNP (last 3 results) No results for input(s): BNP in the last 8760 hours. Basic Metabolic Panel:  Recent Labs Lab 10/19/16 1336  NA 133*  K 3.7  CL 101  CO2 23  GLUCOSE 164*  BUN 11  CREATININE 1.03  CALCIUM 8.3*   Liver Function Tests:  Recent Labs Lab 10/19/16 1336  AST 47*  ALT 44  ALKPHOS 62  BILITOT 0.8  PROT 7.1  ALBUMIN 4.0    Recent Labs Lab 10/19/16 1336  LIPASE 17   No results for input(s): AMMONIA in  the last 168 hours. CBC:  Recent Labs Lab 10/19/16 0933  WBC 10.2  HGB 14.9  HCT 42.1*  MCV 82.6   Cardiac Enzymes: No results for input(s): CKTOTAL, CKMB, CKMBINDEX, TROPONINI in the last 168 hours. BNP: Invalid input(s): POCBNP CBG: No results for input(s): GLUCAP in the last 168 hours. D-Dimer No results for input(s): DDIMER in the last 72 hours. Hgb A1c No results for input(s): HGBA1C in the last 72 hours. Lipid Profile No results for input(s): CHOL, HDL, LDLCALC, TRIG, CHOLHDL, LDLDIRECT in the last 72 hours. Thyroid function studies No results for input(s): TSH, T4TOTAL, T3FREE, THYROIDAB in the last 72 hours.  Invalid input(s): FREET3 Anemia work up No results for input(s): VITAMINB12, FOLATE, FERRITIN, TIBC, IRON, RETICCTPCT in the last 72 hours. Urinalysis    Component Value Date/Time   COLORURINE STRAW (A) 10/19/2016 1621   APPEARANCEUR CLEAR 10/19/2016 1621   LABSPEC 1.002 (L) 10/19/2016 1621   PHURINE 6.0 10/19/2016 1621   GLUCOSEU NEGATIVE 10/19/2016 1621   HGBUR NEGATIVE 10/19/2016 1621   BILIRUBINUR NEGATIVE 10/19/2016 1621   BILIRUBINUR negative 11/28/2015 1317   KETONESUR NEGATIVE 10/19/2016 1621   PROTEINUR NEGATIVE 10/19/2016 1621   UROBILINOGEN 0.2 11/28/2015 1317   NITRITE NEGATIVE 10/19/2016 1621   LEUKOCYTESUR NEGATIVE 10/19/2016 1621   Sepsis Labs Invalid input(s): PROCALCITONIN,  WBC,  LACTICIDVEN Microbiology No results found for this or any previous visit (from the past 240 hour(s)).   Time coordinating discharge: Over 30 minutes  SIGNED:   Marinda ElkFELIZ ORTIZ, Pasha Broad, MD  [facility] 10/20/2016, 10:33 AM Pager   If 7PM-7AM, please contact night-coverage [email or web address] Password [email or web address]

## 2016-10-20 NOTE — Care Management Note (Signed)
Case Management Note  Patient Details  Name: Brian Alvarez MRN: [id number] Date of Birth: [birth year]  Subjective/Objective:  31 y/o m admitted w/PNA. From home. No Cm needs.                  Action/Plan:d/c home.   Expected Discharge Date:  10/20/16               Expected Discharge Plan:  Home/Self Care  In-House Referral:     Discharge planning Services  CM Consult  Post Acute Care Choice:    Choice offered to:     DME Arranged:    DME Agency:     HH Arranged:    HH Agency:     Status of Service:  Completed, signed off  If discussed at [employer] Length of Stay Meetings, dates discussed:    Additional Comments:  Lanier ClamMahabir, Izabella Marcantel, RN 10/20/2016, 10:53 AM

## 2016-10-21 ENCOUNTER — Encounter: Payer: Self-pay | Admitting: Physician Assistant

## 2016-10-21 ENCOUNTER — Ambulatory Visit (INDEPENDENT_AMBULATORY_CARE_PROVIDER_SITE_OTHER): Payer: BLUE CROSS/BLUE SHIELD | Admitting: Physician Assistant

## 2016-10-21 VITALS — BP 142/88 | HR 91 | Temp 97.7°F | Resp 20 | Ht 69.0 in | Wt 306.0 lb

## 2016-10-21 DIAGNOSIS — R059 Cough, unspecified: Secondary | ICD-10-CM

## 2016-10-21 DIAGNOSIS — J181 Lobar pneumonia, unspecified organism: Secondary | ICD-10-CM | POA: Diagnosis not present

## 2016-10-21 DIAGNOSIS — R062 Wheezing: Secondary | ICD-10-CM | POA: Diagnosis not present

## 2016-10-21 DIAGNOSIS — J189 Pneumonia, unspecified organism: Secondary | ICD-10-CM

## 2016-10-21 DIAGNOSIS — R05 Cough: Secondary | ICD-10-CM | POA: Diagnosis not present

## 2016-10-21 LAB — URINE CULTURE: Culture: NO GROWTH

## 2016-10-21 MED ORDER — ALBUTEROL SULFATE HFA 108 (90 BASE) MCG/ACT IN AERS: INHALATION_SPRAY | RESPIRATORY_TRACT | 0 refills | Status: DC

## 2016-10-21 NOTE — Patient Instructions (Addendum)
My fitness PAL --    IF you received an x-ray today, you will receive an invoice from [facility]. Please contact [facility] at [phone] with questions or concerns regarding your invoice.   IF you received labwork today, you will receive an invoice from [facility]. Please contact [facility] at [phone] with questions or concerns regarding your invoice.   Our billing staff will not be able to assist you with questions regarding bills from these companies.  You will be contacted with the lab results as soon as they are available. The fastest way to get your results is to activate your My Chart account. Instructions are located on the last page of this paperwork. If you have not heard from us regarding the results in 2 weeks, please contact this office.

## 2016-10-21 NOTE — Progress Notes (Signed)
Brian LernerJames Alvarez  MRN: [id number] DOB: December 25, 1985  Subjective:  Pt presents to clinic for hospital follow-up from pneumonia -- he feels a lot better - he is still wheezing and using his albuterol neb every 4 hours and that has helped.  He has been using his oral prednisone and as as his daily inhaled steroids.  He is still coughing up green thick mucus.  He has improved SOB and no fevers in the last 24h hours.  He is also interested in weight loss - last year he worked hard using my fitness pal and he realized that he was missing a lot of his liquid calories on his calorie counting and he thinks that is why he was not as successful as he wanted to be.  He is not an active person.  He has never seen a nutritionist.    Review of Systems  Constitutional: Negative for chills and fever.  Respiratory: Positive for cough and wheezing. Negative for shortness of breath.     Patient Active Problem List   Diagnosis Date Noted  . Community acquired bacterial pneumonia 10/19/2016  . Asthma exacerbation 10/19/2016  . Attention deficit hyperactivity disorder (ADHD) 04/29/2016  . Obesity, Class III, BMI 40-49.9 (morbid obesity) (HCC) 01/08/2016  . Seasonal allergies 09/12/2012  . Asthma 12/26/2011  . Carpal tunnel syndrome 11/11/2011    Current Outpatient Prescriptions on File Prior to Visit  Medication Sig Dispense Refill  . acetaminophen (TYLENOL) 325 MG tablet Take 650 mg by mouth every 6 (six) hours as needed.    . benzonatate (TESSALON) 100 MG capsule Take 1-2 capsules (100-200 mg total) by mouth 3 (three) times daily as needed. 60 capsule 0  . EPINEPHrine (EPI-PEN) 0.3 mg/0.3 mL DEVI Inject 0.3 mLs (0.3 mg total) into the muscle once. 2 Device 0  . Fluticasone Furoate-Vilanterol 200-25 MCG/INH AEPB Inhale 1 puff into the lungs daily.     Marland Kitchen. HYDROcodone-homatropine (HYCODAN) 5-1.5 MG/5ML syrup Take 5 mLs by mouth at bedtime as needed. 100 mL 0  . hydrOXYzine (ATARAX/VISTARIL) 25 MG tablet Take 0.5-1  tablets (12.5-25 mg total) by mouth at bedtime as needed for itching. 30 tablet 0  . ibuprofen (ADVIL,MOTRIN) 200 MG tablet Take 600 mg by mouth every 6 (six) hours as needed.     Marland Kitchen. levocetirizine (XYZAL) 5 MG tablet Take 5 mg by mouth every evening.    Marland Kitchen. levofloxacin (LEVAQUIN) 500 MG tablet Take 1 tablet (500 mg total) by mouth daily. 6 tablet 0  . montelukast (SINGULAIR) 10 MG tablet Take one daily at bedtime 30 tablet 11  . Multiple Vitamins-Minerals (MULTIVITAMIN WITH MINERALS) tablet Take 1 tablet by mouth daily.    . pantoprazole (PROTONIX) 40 MG tablet Take 1 tablet (40 mg total) by mouth daily. 14 tablet 0  . predniSONE (DELTASONE) 10 MG tablet Takes 6 tablets for 1 days, then 5 tablets for 1 days, then 4 tablets for 1 days, then 3 tablets for 1 days, then 2 tabs for 1 days, then 1 tab for 1 days, and then stop. 21 tablet 0  . pseudoephedrine (SUDAFED 12 HOUR) 120 MG 12 hr tablet Take 1 tablet (120 mg total) by mouth 2 (two) times daily. 30 tablet 3  . sertraline (ZOLOFT) 50 MG tablet Take 1 tablet (50 mg total) by mouth daily. 90 tablet 3  . traMADol (ULTRAM) 50 MG tablet Take 1 tablet (50 mg total) by mouth every 8 (eight) hours as needed. 25 tablet 1  . amphetamine-dextroamphetamine (ADDERALL) 10  MG tablet Take 1 tablet (10 mg total) by mouth 2 (two) times daily with a meal. 60 tablet 0   No current facility-administered medications on file prior to visit.     Allergies  Allergen Reactions  . Penicillins     Hives Has patient had a PCN reaction causing immediate rash, facial/tongue/throat swelling, SOB or lightheadedness with hypotension: yes Has patient had a PCN reaction causing severe rash involving mucus membranes or skin necrosis: yes Has patient had a PCN reaction that required hospitalization: no Has patient had a PCN reaction occurring within the last 10 years: yes If all of the above answers are "NO", then may proceed with Cephalosporin use.  Marland Kitchen Rosemary Oil     Pt  patients past, family and social history were reviewed and updated.   Objective:  BP (!) 142/88   Pulse 91   Temp 97.7 F (36.5 C) (Oral)   Resp 20   Ht 5\' 9"  (1.753 m)   Wt (!) 306 lb (138.8 kg)   SpO2 96%   BMI 45.19 kg/m   Physical Exam  Constitutional: He is oriented to person, place, and time and well-developed, well-nourished, and in no distress.  HENT:  Head: Normocephalic and atraumatic.  Right Ear: External ear normal.  Left Ear: External ear normal.  Eyes: Conjunctivae are normal.  Neck: Normal range of motion.  Cardiovascular: Normal rate, regular rhythm and normal heart sounds.   No murmur heard. Pulmonary/Chest: Effort normal. No respiratory distress. He has wheezes (end expiratory all lungs fields). He has no rales.  Neurological: He is alert and oriented to person, place, and time. Gait normal.  Skin: Skin is warm and dry.  Psychiatric: Mood, memory, affect and judgment normal.    Assessment and Plan :  Pneumonia of right middle lobe due to infectious organism (HCC) - improved - he will continue and finish his medications - levaquin and prednisone - he will continue his albuterol neb every 4 hours for the wheezing and continue his inhaled steroid.  Cough - trial of mucinex to help continue getting the mucus out of his chest  Wheezing  Obesity, Class III, BMI 40-49.9 (morbid obesity) (HCC) - d/w nutrition therapy or referral to Dr Brian Alvarez at weight management clinic - he will contact me with his thoughts.  Brian Lennert PA-C  Primary [facility] at [facility] 10/21/2016 2:50 PM

## 2016-10-24 LAB — CULTURE, BLOOD (ROUTINE X 2)
Culture: NO GROWTH
Culture: NO GROWTH

## 2016-10-29 ENCOUNTER — Ambulatory Visit (INDEPENDENT_AMBULATORY_CARE_PROVIDER_SITE_OTHER): Payer: BLUE CROSS/BLUE SHIELD | Admitting: Family Medicine

## 2016-10-29 VITALS — BP 136/84 | HR 108 | Temp 99.0°F | Resp 18 | Ht 69.0 in | Wt 301.0 lb

## 2016-10-29 DIAGNOSIS — J189 Pneumonia, unspecified organism: Secondary | ICD-10-CM

## 2016-10-29 DIAGNOSIS — J181 Lobar pneumonia, unspecified organism: Secondary | ICD-10-CM

## 2016-10-29 DIAGNOSIS — J3489 Other specified disorders of nose and nasal sinuses: Secondary | ICD-10-CM

## 2016-10-29 MED ORDER — MUPIROCIN CALCIUM 2 % EX CREA: 1.0000 "application " | TOPICAL_CREAM | Freq: Two times a day (BID) | CUTANEOUS | 0 refills | Status: DC

## 2016-10-29 NOTE — Progress Notes (Signed)
Chief Complaint  Patient presents with  . Follow-up    pneumonia    HPI   Pt was diagnosed with the flu and was seen in the ER for CAP. He currently has a nasal blister that he thinks is a staph infection He reports that he received numerous breathing treatments and noticed that it has been still very painful.  He was discharged home 10/20/16 He saw Benny Lennert since 10/21/16 and was advised to try mucinex He completed levaquin and prednisone He denies fevers or chills Reports that his breathing is better and that he should less like he has "ice cubes in his lungs".   Past Medical History:  Diagnosis Date  . Allergy   . Anemia   . Asthma   . Carpal tunnel syndrome   . Carpal tunnel syndrome     Current Outpatient Prescriptions  Medication Sig Dispense Refill  . acetaminophen (TYLENOL) 325 MG tablet Take 650 mg by mouth every 6 (six) hours as needed.    Marland Kitchen albuterol (VENTOLIN HFA) 108 (90 Base) MCG/ACT inhaler Inhale 2 puffs into the lungs every 6 hours as needed.  "OV NEEDED FOR ADDITIONAL REFILLS" 54 g 0  . EPINEPHrine (EPI-PEN) 0.3 mg/0.3 mL DEVI Inject 0.3 mLs (0.3 mg total) into the muscle once. 2 Device 0  . Fluticasone Furoate-Vilanterol 200-25 MCG/INH AEPB Inhale 1 puff into the lungs daily.     . hydrOXYzine (ATARAX/VISTARIL) 25 MG tablet Take 0.5-1 tablets (12.5-25 mg total) by mouth at bedtime as needed for itching. 30 tablet 0  . ibuprofen (ADVIL,MOTRIN) 200 MG tablet Take 600 mg by mouth every 6 (six) hours as needed.     Marland Kitchen levocetirizine (XYZAL) 5 MG tablet Take 5 mg by mouth every evening.    . montelukast (SINGULAIR) 10 MG tablet Take one daily at bedtime 30 tablet 11  . Multiple Vitamins-Minerals (MULTIVITAMIN WITH MINERALS) tablet Take 1 tablet by mouth daily.    . pantoprazole (PROTONIX) 40 MG tablet Take 1 tablet (40 mg total) by mouth daily. 14 tablet 0  . sertraline (ZOLOFT) 50 MG tablet Take 1 tablet (50 mg total) by mouth daily. 90 tablet 3  . traMADol  (ULTRAM) 50 MG tablet Take 1 tablet (50 mg total) by mouth every 8 (eight) hours as needed. 25 tablet 1  . amphetamine-dextroamphetamine (ADDERALL) 10 MG tablet Take 1 tablet (10 mg total) by mouth 2 (two) times daily with a meal. 60 tablet 0  . mupirocin cream (BACTROBAN) 2 % Apply 1 application topically 2 (two) times daily. 15 g 0   No current facility-administered medications for this visit.     Allergies:  Allergies  Allergen Reactions  . Penicillins     Hives Has patient had a PCN reaction causing immediate rash, facial/tongue/throat swelling, SOB or lightheadedness with hypotension: yes Has patient had a PCN reaction causing severe rash involving mucus membranes or skin necrosis: yes Has patient had a PCN reaction that required hospitalization: no Has patient had a PCN reaction occurring within the last 10 years: yes If all of the above answers are "NO", then may proceed with Cephalosporin use.  Marland Kitchen Rosemary Oil     History reviewed. No pertinent surgical history.  Social History   Social History  . Marital status: Single    Spouse name: N/A  . Number of children: N/A  . Years of education: N/A   Social History Main Topics  . Smoking status: Former Games developer  . Smokeless tobacco: Never Used  Comment: has switched to electronic cigarette  . Alcohol use Yes     Comment: occassional  . Drug use: No  . Sexual activity: Yes   Other Topics Concern  . None   Social History Narrative  . None    ROS  Objective: Vitals:   10/29/16 1435  BP: 136/84  Pulse: (!) 108  Resp: 18  Temp: 99 F (37.2 C)  TempSrc: Oral  SpO2: 96%  Weight: (!) 301 lb (136.5 kg)  Height: 5\' 9"  (1.753 m)    Physical Exam General: alert, oriented, in NAD Head: normocephalic, atraumatic, no sinus tenderness Eyes: EOM intact, no scleral icterus or conjunctival injection Ears: TM clear bilaterally Throat: no pharyngeal exudate or erythema Lymph: no posterior auricular, submental or  cervical lymph adenopathy Heart: normal rate, normal sinus rhythm, no murmurs Lungs: clear to auscultation bilaterally, no wheezing Skin: left nare with yellowish-reddish crusty lesion  Assessment and Plan Fayrene FearingJames was seen today for follow-up.  Diagnoses and all orders for this visit:  Pneumonia of right middle lobe due to infectious organism [facility](HCC)- clinically improving   Nasal lesion- discussed that it is likely a staph infection based on the crusty appearing lesion Will treat with bactroban and await culture -     WOUND CULTURE -     mupirocin cream (BACTROBAN) 2 %; Apply 1 application topically 2 (two) times daily.     Xaviera Flaten A Marvelous [place]

## 2016-11-01 LAB — WOUND CULTURE: ORGANISM ID, BACTERIA: NONE SEEN

## 2016-11-11 ENCOUNTER — Encounter: Payer: Self-pay | Admitting: Physician Assistant

## 2016-11-11 DIAGNOSIS — F900 Attention-deficit hyperactivity disorder, predominantly inattentive type: Secondary | ICD-10-CM

## 2016-11-16 MED ORDER — AMPHETAMINE-DEXTROAMPHETAMINE 10 MG PO TABS: 10.0000 mg | ORAL_TABLET | Freq: Two times a day (BID) | ORAL | 0 refills | Status: DC

## 2016-11-16 NOTE — Telephone Encounter (Signed)
Done.  Pt knows through mychart 

## 2016-11-22 ENCOUNTER — Ambulatory Visit (INDEPENDENT_AMBULATORY_CARE_PROVIDER_SITE_OTHER): Payer: BLUE CROSS/BLUE SHIELD | Admitting: Family Medicine

## 2016-11-22 VITALS — BP 140/90 | HR 91 | Temp 99.2°F | Resp 18 | Ht 69.0 in | Wt 307.0 lb

## 2016-11-22 DIAGNOSIS — R5383 Other fatigue: Secondary | ICD-10-CM | POA: Diagnosis not present

## 2016-11-22 DIAGNOSIS — G4700 Insomnia, unspecified: Secondary | ICD-10-CM

## 2016-11-22 NOTE — Progress Notes (Signed)
Patient ID: Brian Alvarez, male    DOB: [birth year], 31 y.o.   MRN: [id number]  PCP: Virgilio Belling  Chief Complaint  Patient presents with  . Medication Refill    ADDERALL  . Insomnia    Subjective:  HPI  31 year old, obese male,  presents for evaluation sleep disturbances. Hx of ADD, recent hx of PNA, and Asthma. He is concerned today regarding his increased tiredness, inability to fall asleep, stay asleep, and inappropriate length of sleep. 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 He reports over the last 4 years, he has struggled with falling asleep, even though he has tried to stick to a routine nightly regimen and avoid extended naps. He has grown concern most recently as he slept a total of 15 hours straight last week which caused him to miss work. He did not hear his phone ring, did not hear his alarm,  Other times he wakes up in the middle of the night at 1-2 am feeling extremely energized His fiance reports that he snores routinely.   Social History   Social History  . Marital status: Single    Spouse name: N/A  . Number of children: N/A  . Years of education: N/A   Occupational History  . Not on file.   Social History Main Topics  . Smoking status: Former Games developer  . Smokeless tobacco: Never Used     Comment: has switched to electronic cigarette  . Alcohol use Yes     Comment: occassional  . Drug use: No  . Sexual activity: Yes   Other Topics Concern  . Not on file   Social History Narrative  . No narrative on file    Family History  Problem Relation Age of Onset  . Arthritis Maternal Grandmother   . Heart disease Maternal Grandfather      Review of Systems  Patient Active Problem List   Diagnosis Date Noted  . Community acquired bacterial pneumonia 10/19/2016  . Asthma exacerbation 10/19/2016  . Attention deficit  hyperactivity disorder (ADHD) 04/29/2016  . Obesity, Class III, BMI 40-49.9 (morbid obesity) (HCC) 01/08/2016  . Seasonal allergies 09/12/2012  . Asthma 12/26/2011  . Carpal tunnel syndrome 11/11/2011    Allergies  Allergen Reactions  . Penicillins     Hives Has patient had a PCN reaction causing immediate rash, facial/tongue/throat swelling, SOB or lightheadedness with hypotension: yes Has patient had a PCN reaction causing severe rash involving mucus membranes or skin necrosis: yes Has patient had a PCN reaction that required hospitalization: no Has patient had a PCN reaction occurring within the last 10 years: yes If all of the above answers are "NO", then may proceed with Cephalosporin use.  Marland Kitchen Rosemary Oil     Prior to Admission medications   Medication Sig Start Date End Date Taking? Authorizing Provider  acetaminophen (TYLENOL) 325 MG tablet Take 650 mg by mouth every 6 (six) hours as needed.   Yes Historical Provider, MD  albuterol (VENTOLIN HFA) 108 (90 Base) MCG/ACT inhaler Inhale 2 puffs into the lungs every 6 hours as needed.  "OV NEEDED FOR ADDITIONAL REFILLS" 10/21/16  Yes Morrell Riddle, PA-C  amphetamine-dextroamphetamine (ADDERALL) 10 MG tablet Take 1 tablet (10 mg total) by mouth 2 (two) times daily with a meal. 11/16/16 12/16/16 Yes Sarah Harvie Bridge, PA-C  EPINEPHrine (EPI-PEN) 0.3  mg/0.3 mL DEVI Inject 0.3 mLs (0.3 mg total) into the muscle once. 10/17/12  Yes Elvina SidleKurt Lauenstein, MD  Fluticasone Furoate-Vilanterol 200-25 MCG/INH AEPB Inhale 1 puff into the lungs daily.    Yes Historical Provider, MD  hydrOXYzine (ATARAX/VISTARIL) 25 MG tablet Take 0.5-1 tablets (12.5-25 mg total) by mouth at bedtime as needed for itching. 04/27/16  Yes Doyle AskewKimberly Stephenia Kamarii Carton, FNP  ibuprofen (ADVIL,MOTRIN) 200 MG tablet Take 600 mg by mouth every 6 (six) hours as needed.    Yes Historical Provider, MD  levocetirizine (XYZAL) 5 MG tablet Take 5 mg by mouth every evening.   Yes Historical Provider,  MD  montelukast (SINGULAIR) 10 MG tablet Take one daily at bedtime 10/22/13  Yes Collene GobbleSteven A Daub, MD  Multiple Vitamins-Minerals (MULTIVITAMIN WITH MINERALS) tablet Take 1 tablet by mouth daily.   Yes Historical Provider, MD  mupirocin cream (BACTROBAN) 2 % Apply 1 application topically 2 (two) times daily. 10/29/16  Yes Zoe A Creta LevinStallings, MD  pantoprazole (PROTONIX) 40 MG tablet Take 1 tablet (40 mg total) by mouth daily. 10/20/16  Yes Marinda ElkAbraham Feliz Ortiz, MD  sertraline (ZOLOFT) 50 MG tablet Take 1 tablet (50 mg total) by mouth daily. 01/08/16  Yes Elvina SidleKurt Lauenstein, MD  traMADol (ULTRAM) 50 MG tablet Take 1 tablet (50 mg total) by mouth every 8 (eight) hours as needed. 06/26/16  Yes Tobey GrimJeffrey H Walden, MD    Past Medical, Surgical Family and Social History reviewed and updated.    Objective:   Today's Vitals   11/22/16 1618  BP: 140/90  Pulse: 91  Resp: 18  Temp: 99.2 F (37.3 C)  TempSrc: Oral  SpO2: 95%  Weight: (!) 307 lb (139.3 kg)  Height: 5\' 9"  (1.753 m)    Wt Readings from Last 3 Encounters:  11/22/16 (!) 307 lb (139.3 kg)  10/29/16 (!) 301 lb (136.5 kg)  10/21/16 (!) 306 lb (138.8 kg)    Physical Exam  Constitutional: He is oriented to person, place, and time. He appears well-developed and well-nourished.  HENT:  Head: Normocephalic.  Neck: Normal range of motion. Neck supple.  Cardiovascular: Normal rate.   Pulmonary/Chest: Effort normal.  Neurological: He is alert and oriented to person, place, and time.  Psychiatric: He has a normal mood and affect. His behavior is normal. Judgment and thought content normal.    Assessment & Plan:  1. Insomnia, unspecified type -Epworth Sleepiness scale 7=normal  - Ambulatory referral to Sleep Studies  2. Fatigue, unspecified type -Likely related to inappropriate sleep cycle   Brian Alvarez is picking up his Adderall prescription that was written by Virgilio BellingSarah Weber-PA-C. I will have him sign the pick-up log and I co-signed as a witness to  him picking up prescription. He again was advised to schedule a follow-up with Maralyn SagoSarah as I will be transferring to another practice in less than 2 weeks.  Godfrey PickKimberly S. Tiburcio PeaHarris, MSN, FNP-C Primary Care at [facility] [phone]

## 2016-11-22 NOTE — Patient Instructions (Addendum)
You will be contacted by [facility] regarding your sleep study.   IF you received an x-ray today, you will receive an invoice from [facility]. Please contact [facility] at [phone] with questions or concerns regarding your invoice.   IF you received labwork today, you will receive an invoice from [facility]. Please contact [facility] at [phone] with questions or concerns regarding your invoice.   Our billing staff will not be able to assist you with questions regarding bills from these companies.  You will be contacted with the lab results as soon as they are available. The fastest way to get your results is to activate your My Chart account. Instructions are located on the last page of this paperwork. If you have not heard from us regarding the results in 2 weeks, please contact this office.     Sleep Apnea Sleep apnea is a condition that affects breathing. People with sleep apnea have moments during sleep when their breathing pauses briefly or gets shallow. Sleep apnea can cause these symptoms:  Trouble staying asleep.  Sleepiness or tiredness during the day.  Irritability.  Loud snoring.  Morning headaches.  Trouble concentrating.  Forgetting things.  Less interest in sex.  Being sleepy for no reason.  Mood swings.  Personality changes.  Depression.  Waking up a lot during the night to pee (urinate).  Dry mouth.  Sore throat. Follow these instructions at home:  Make any changes in your routine that your doctor recommends.  Eat a healthy, well-balanced diet.  Take over-the-counter and prescription medicines only as told by your doctor.  Avoid using alcohol, calming medicines (sedatives), and narcotic medicines.  Take steps to lose weight if you are overweight.  If you were given a machine (device) to use while you sleep, use it only as told by your doctor.  Do not use any tobacco products, such as cigarettes, chewing  tobacco, and e-cigarettes. If you need help quitting, ask your doctor.  Keep all follow-up visits as told by your doctor. This is important. Contact a doctor if:  The machine that you were given to use during sleep is uncomfortable or does not seem to be working.  Your symptoms do not get better.  Your symptoms get worse. Get help right away if:  Your chest hurts.  You have trouble breathing in enough air (shortness of breath).  You have an uncomfortable feeling in your back, arms, or stomach.  You have trouble talking.  One side of your body feels weak.  A part of your face is hanging down (drooping). These symptoms may be an emergency. Do not wait to see if the symptoms will go away. Get medical help right away. Call your local emergency services (911 in the [place] Do not drive yourself to the hospital. This information is not intended to replace advice given to you by your health care provider. Make sure you discuss any questions you have with your health care provider. Document Released: 06/15/2008 Document Revised: 05/02/2016 Document Reviewed: 06/16/2015 Elsevier Interactive Patient Education  2017 [employer]

## 2016-11-24 ENCOUNTER — Encounter: Payer: Self-pay | Admitting: Neurology

## 2016-11-24 ENCOUNTER — Ambulatory Visit (INDEPENDENT_AMBULATORY_CARE_PROVIDER_SITE_OTHER): Payer: BLUE CROSS/BLUE SHIELD | Admitting: Neurology

## 2016-11-24 ENCOUNTER — Telehealth: Payer: Self-pay | Admitting: Neurology

## 2016-11-24 VITALS — BP 148/88 | HR 100 | Resp 20 | Ht 69.0 in | Wt 304.0 lb

## 2016-11-24 DIAGNOSIS — F513 Sleepwalking [somnambulism]: Secondary | ICD-10-CM | POA: Diagnosis not present

## 2016-11-24 DIAGNOSIS — Z6841 Body Mass Index (BMI) 40.0 and over, adult: Secondary | ICD-10-CM

## 2016-11-24 DIAGNOSIS — G4710 Hypersomnia, unspecified: Secondary | ICD-10-CM | POA: Diagnosis not present

## 2016-11-24 DIAGNOSIS — R0683 Snoring: Secondary | ICD-10-CM

## 2016-11-24 DIAGNOSIS — G4730 Sleep apnea, unspecified: Secondary | ICD-10-CM

## 2016-11-24 DIAGNOSIS — G4761 Periodic limb movement disorder: Secondary | ICD-10-CM | POA: Diagnosis not present

## 2016-11-24 DIAGNOSIS — E669 Obesity, unspecified: Secondary | ICD-10-CM

## 2016-11-24 NOTE — Telephone Encounter (Signed)
SPLIt with expanded EEG- if insurance does not allow SLIT, make it parasomnia only.

## 2016-11-24 NOTE — Telephone Encounter (Signed)
Please clarify which test I need to schedule.  NPSG or Split?

## 2016-11-24 NOTE — Progress Notes (Signed)
SLEEP MEDICINE CLINIC   Provider:  Melvyn Novas, M D  Referring Provider: Larkin Ina Primary Care Physician:  Virgilio Belling  Chief Complaint  Patient presents with  . New Patient (Initial Visit)    snoring, never had sleep study    HPI:  Brian Alvarez is a 31 y.o. male , seen here as a referral from PA Weber for a sleep study.    Dear Maralyn Sago,  Thank you for sending Mr. Guevarra my way,  As you know, he is a 31 year old caucasian, right handed male patient , engaged to be married soon -on December 18 2016. He is morbidly obese. He just overcame pneumonia.  He has asthma,   He presents here today because of a feeling of increased tiredness and lack of energy daytime sleepiness and trouble with falling asleep and staying awake in daytime. Some days he will sleep throughout the day, but most scary and confusing to him is that he apparently can have conversations even eats breakfast in the presence of his fiance and then cannot recollect it hours later. Apparently he goes back to sleep or has never been awake. He has never been a sleep walker not in his childhood not as an adult, he may have had a couple of night terror episodes but no regular encounters. Very rarely has he had such vivid and scary dreams that he would wake up with palpitations, diaphoresis and feeling scared. He feels that in the mornings his tiredness, his leaden fatigue is so severe that he cannot function in the workplace area, but  his current employer allows him a lot of liberty with flexible hours.  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 He works in a [employer]. 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   Sleep habits are as follows: He reports that his bedtime is between 10 and midnight, but he may enter sleep hours later. He avoids looking at the clock, and he usually leaves the bed when he feels that he struggles to go to sleep and distract  himself with something relaxing and entertaining. He has noted a better chance then to initiate sleep a little later. He sometimes has vivid dreams but these are exceptions. He usually once asleep can stay asleep for about 6 hours, but he may sleep up to 10 hours straight he does not necessarily have to go to the bathroom he does not wake up with shortness of breath diaphoresis, palpitations or headaches. No dizziness or nausea. If he has to stop working at a certain hour or has to keep an appointment in the morning he will set an alarm - he has managed to sleep through those. The best arrangement is usually when his fiance is asked to wake him. It is very difficult to arouse him in the morning and as he described earlier he may act awake, but isn't ! His fiance has also witnessed him sleep talking about things that make no sense to her and neither to him. At times he wants to discuss , for example the " harvest ", but he plants nothing. He has not an activated to dreams however he does  leave the bed - walks to the couch without recollecting this later.  He does not kick, thrash or yell.   Sleep medical history and family sleep history:   The patient's mother is not known to have  sleep disorders, but she has struggled with addiction. He does not know his father well, neither his paternal health history. The patient has not had concussions or contusions, traumatic brain injuries, neck injuries no ENT trauma facial injuries or surgeries.   Social history:  Engaged, works on 3 D Journalist, newspaper.  Non tobacco user ( quit in  March 2015) , not longer drinking, only 1 beer a month.  caffeine daily - coffee,tea, sodas.   Review of Systems: Out of a complete 14 system review, the patient complains of only the following symptoms, and all other reviewed systems are negative. Hypersomnia in daytime, with insomnia at nighttime, daytime fatigue the feeling of loss of energy and of not being refreshed or restored in the  morning. He does not wake up with headaches but with a dry mouth and occasional.  ADHD,  On zoloft and adderall.   Epworth score 10.  Watching TV, as a passenger, in the afternoon..  , Fatigue severity score 33  , depression score 1/1 5    Social History   Social History  . Marital status: Single    Spouse name: N/A  . Number of children: N/A  . Years of education: N/A   Occupational History  . Not on file.   Social History Main Topics  . Smoking status: Former Games developer  . Smokeless tobacco: Never Used     Comment: has switched to electronic cigarette  . Alcohol use Yes     Comment: occassional  . Drug use: No  . Sexual activity: Yes   Other Topics Concern  . Not on file   Social History Narrative  . No narrative on file    Family History  Problem Relation Age of Onset  . Arthritis Maternal Grandmother   . Heart disease Maternal Grandfather     Past Medical History:  Diagnosis Date  . Allergy   . Anemia   . Asthma   . Carpal tunnel syndrome   . Carpal tunnel syndrome     No past surgical history on file.  Current Outpatient Prescriptions  Medication Sig Dispense Refill  . acetaminophen (TYLENOL) 325 MG tablet Take 650 mg by mouth every 6 (six) hours as needed.    Marland Kitchen albuterol (VENTOLIN HFA) 108 (90 Base) MCG/ACT inhaler Inhale 2 puffs into the lungs every 6 hours as needed.  "OV NEEDED FOR ADDITIONAL REFILLS" 54 g 0  . amphetamine-dextroamphetamine (ADDERALL) 10 MG tablet Take 1 tablet (10 mg total) by mouth 2 (two) times daily with a meal. 60 tablet 0  . EPINEPHrine (EPI-PEN) 0.3 mg/0.3 mL DEVI Inject 0.3 mLs (0.3 mg total) into the muscle once. 2 Device 0  . Fluticasone Furoate-Vilanterol 200-25 MCG/INH AEPB Inhale 1 puff into the lungs daily.     . hydrOXYzine (ATARAX/VISTARIL) 25 MG tablet Take 0.5-1 tablets (12.5-25 mg total) by mouth at bedtime as needed for itching. 30 tablet 0  . ibuprofen (ADVIL,MOTRIN) 200 MG tablet Take 600 mg by mouth every 6 (six)  hours as needed.     Marland Kitchen levocetirizine (XYZAL) 5 MG tablet Take 5 mg by mouth every evening.    . montelukast (SINGULAIR) 10 MG tablet Take one daily at bedtime 30 tablet 11  . Multiple Vitamins-Minerals (MULTIVITAMIN WITH MINERALS) tablet Take 1 tablet by mouth daily.    . mupirocin cream (BACTROBAN) 2 % Apply 1 application topically 2 (two) times daily. 15 g 0  . pantoprazole (PROTONIX) 40 MG tablet Take 1 tablet (40 mg total) by  mouth daily. 14 tablet 0  . sertraline (ZOLOFT) 50 MG tablet Take 1 tablet (50 mg total) by mouth daily. 90 tablet 3  . traMADol (ULTRAM) 50 MG tablet Take 1 tablet (50 mg total) by mouth every 8 (eight) hours as needed. 25 tablet 1   No current facility-administered medications for this visit.     Allergies as of 11/24/2016 - Review Complete 11/24/2016  Allergen Reaction Noted  . Penicillins  10/17/2012  . Rosemary oil  10/19/2016    Vitals: BP (!) 148/88   Pulse 100   Resp 20   Ht 5\' 9"  (1.753 m)   Wt (!) 304 lb (137.9 kg)   BMI 44.89 kg/m  Last Weight:  Wt Readings from Last 1 Encounters:  11/24/16 (!) 304 lb (137.9 kg)   NWG:NFAO mass index is 44.89 kg/m.     Last Height:   Ht Readings from Last 1 Encounters:  11/24/16 5\' 9"  (1.753 m)    Physical exam:  General: The patient is awake, alert and appears not in acute distress. The patient has full facial hair. Head: Normocephalic, atraumatic. Neck is supple. Mallampati 3,  neck circumference:18.  Nasal airflow patent , TMJ click evident . Retrognathia is seen.  Cardiovascular:  Regular rate and rhythm , without  murmurs or carotid bruit, and without distended neck veins. Respiratory: Lungs are clear to auscultation. Skin:  Without evidence of edema, or rash Trunk: BMI is 45 . The patient's posture is stooped. He is portly.    Neurologic exam : The patient is awake and alert, oriented to place and time.  Attention span & concentration ability appears normal.  Speech is fluent,  without  dysarthria, dysphonia or aphasia.  Mood and affect are dramatic.  Cranial nerves: Pupils are equal and briskly reactive to light. Funduscopic exam without evidence of pallor or edema. Extraocular movements  in vertical and horizontal planes intact and without nystagmus. Visual fields by finger perimetry are intact. Hearing to finger rub intact.   Facial sensation intact to fine touch.  Facial motor strength is symmetric and tongue and uvula move midline. Shoulder shrug was symmetrical.   Motor exam:  Normal tone, muscle bulk and symmetric strength in all extremities. Sensory:  Fine touch, pinprick and vibration,  Proprioception tested in the upper extremities was normal. Coordination: Rapid alternating movements, Finger-to-nose maneuver  normal without evidence of ataxia, dysmetria or tremor. Gait and station: Patient walks without assistive device and is able unassisted to climb up to the exam table. Strength within normal limits.  Stance is stable and normal.  Deep tendon reflexes: in the  upper and lower extremities are symmetric and intact. Babinski maneuver response is downgoing.    Assessment:  After physical and neurologic examination, review of laboratory studies,  Personal review of imaging studies, reports of other /same  Imaging studies, my assessment is     Dear Maralyn Sago, I had the pleasure of meeting your patient Mr. Hemphill today who does have several risk factors for the presence of obstructive sleep apnea, including a high body mass index above 45, consistent with an a and a criteria for superobesity. He has recently overcome pneumonia, he has allergic asthmatic airway disease, reactive airway disease, he has a high-grade Mallampati and a large neck circumference. These are his risk factors.   In addition he has been witnessed to snore very loudly, he does have crescendo snoring, with pauses.   There is also a secondary sleep disorder, he is restless, periodically moves  his limbs,  this condition is known as PLM    He is not always awake when he acts awakeand parasomnia activity. I do think he is a sleep walker and sleep talker.  He doesn't have any dream intrusions or dream hallucinations, neither sleep paralysis,  but he cannot recall his actions in the early morning( after returning to sleep and waking up again). He will not know that he already had breakfast or communicated with his fiance !   Plan:  Treatment plan and additional workup :  For a patient with his BMI and snoring and witnessed apneas, his sleep study needs to be a SPLIT.  He needs  parasomnia montage for sleep walking , sleep talking. He has PLM disorder. Pneumonia, asthma, reactive airway disease. Patient on Adderall ( amphetamine ) , Zoloft ( SSRI ), and on low-dose opiate medication, tramadol.  The patient was advised of the nature of the diagnosed sleep disorder , the treatment options and risks for general a health and wellness arising from not treating the condition.  I spent more than 45  minutes of face to face time with the patient. Greater than 50% of time was spent in counseling and coordination of care. We have discussed the diagnosis and differential and I answered the patient's questions.    Aeva Posey MD  11/24/2016   CC: Morrell RiPorfirio MylarddleSarah L Weber, Pa-c [address] [place], [place] [id number]

## 2016-11-30 ENCOUNTER — Telehealth: Payer: Self-pay

## 2016-11-30 DIAGNOSIS — F513 Sleepwalking [somnambulism]: Secondary | ICD-10-CM

## 2016-11-30 DIAGNOSIS — R0683 Snoring: Secondary | ICD-10-CM

## 2016-11-30 NOTE — Telephone Encounter (Signed)
-----   Message from Cecilie KicksRobin H Hyler sent at 11/30/2016 11:51 AM EDT ----- Ca I get an HST order. Spoke to Dr. Vickey Hugerohmeier and she approved. Split was denied

## 2016-11-30 NOTE — Telephone Encounter (Signed)
I placed HST order.

## 2016-12-29 DIAGNOSIS — J301 Allergic rhinitis due to pollen: Secondary | ICD-10-CM | POA: Diagnosis not present

## 2016-12-29 DIAGNOSIS — J4520 Mild intermittent asthma, uncomplicated: Secondary | ICD-10-CM | POA: Diagnosis not present

## 2016-12-29 DIAGNOSIS — R21 Rash and other nonspecific skin eruption: Secondary | ICD-10-CM | POA: Diagnosis not present

## 2016-12-29 DIAGNOSIS — H1045 Other chronic allergic conjunctivitis: Secondary | ICD-10-CM | POA: Diagnosis not present

## 2017-01-09 ENCOUNTER — Other Ambulatory Visit: Payer: Self-pay | Admitting: Physician Assistant

## 2017-01-09 ENCOUNTER — Other Ambulatory Visit: Payer: Self-pay | Admitting: Family Medicine

## 2017-01-10 DIAGNOSIS — F4010 Social phobia, unspecified: Secondary | ICD-10-CM | POA: Diagnosis not present

## 2017-01-15 ENCOUNTER — Ambulatory Visit (INDEPENDENT_AMBULATORY_CARE_PROVIDER_SITE_OTHER): Payer: BLUE CROSS/BLUE SHIELD | Admitting: Family Medicine

## 2017-01-15 ENCOUNTER — Encounter: Payer: Self-pay | Admitting: Family Medicine

## 2017-01-15 VITALS — BP 143/94 | HR 87 | Temp 98.1°F | Resp 17 | Ht 69.0 in | Wt 305.0 lb

## 2017-01-15 DIAGNOSIS — K047 Periapical abscess without sinus: Secondary | ICD-10-CM

## 2017-01-15 MED ORDER — CLINDAMYCIN HCL 300 MG PO CAPS: 300.0000 mg | ORAL_CAPSULE | Freq: Three times a day (TID) | ORAL | 0 refills | Status: DC

## 2017-01-15 NOTE — Patient Instructions (Addendum)
  It was good to see you today.  For your tooth infection, take the Clindamycin 1 pill three times a day (breakfast, lunch, dinner) for the next week.  Follow up with your dentist as you already planned.  Let me know if this isn't helping.  IF you received an x-ray today, you will receive an invoice from [facility]. Please contact [facility] at [phone] with questions or concerns regarding your invoice.   IF you received labwork today, you will receive an invoice from [facility]. Please contact [facility] at [phone] with questions or concerns regarding your invoice.   Our billing staff will not be able to assist you with questions regarding bills from these companies.  You will be contacted with the lab results as soon as they are available. The fastest way to get your results is to activate your My Chart account. Instructions are located on the last page of this paperwork. If you have not heard from [place] regarding the results in 2 weeks, please contact this office.

## 2017-01-15 NOTE — Progress Notes (Signed)
Brian Alvarez is a 31 y.o. male who presents to Primary Care at [facility] today for dental pain:  1.  Dental pain:  Patient began experiencing dental pain along the left upper molars on Tuesday of this week. He had difficulty sleeping Tuesday and Wednesday night secondary to pain. He felt a "bubble" along the gumline here. As we continued his been taking ibuprofen with relief of his pain but he continues to have persistent "bubble" as well as some drainage. He is trying schedule with his dentist but wanted to come here since he is not yet evaluated by his dentist.  No fevers or chills. No other oral lesions is noted. Eating and drinking well.  ROS as above.    PMH reviewed. Patient is a nonsmoker.   Past Medical History:  Diagnosis Date  . Allergy   . Anemia   . Asthma   . Carpal tunnel syndrome   . Carpal tunnel syndrome    No past surgical history on file.  Medications reviewed. Current Outpatient Prescriptions  Medication Sig Dispense Refill  . acetaminophen (TYLENOL) 325 MG tablet Take 650 mg by mouth every 6 (six) hours as needed.    Marland Kitchen amphetamine-dextroamphetamine (ADDERALL) 10 MG tablet Take 1 tablet (10 mg total) by mouth 2 (two) times daily with a meal. 60 tablet 0  . EPINEPHrine (EPI-PEN) 0.3 mg/0.3 mL DEVI Inject 0.3 mLs (0.3 mg total) into the muscle once. 2 Device 0  . Fluticasone Furoate-Vilanterol 200-25 MCG/INH AEPB Inhale 1 puff into the lungs daily.     . hydrOXYzine (ATARAX/VISTARIL) 25 MG tablet Take 0.5-1 tablets (12.5-25 mg total) by mouth at bedtime as needed for itching. 30 tablet 0  . ibuprofen (ADVIL,MOTRIN) 200 MG tablet Take 600 mg by mouth every 6 (six) hours as needed.     Marland Kitchen levocetirizine (XYZAL) 5 MG tablet Take 5 mg by mouth every evening.    . montelukast (SINGULAIR) 10 MG tablet Take one daily at bedtime 30 tablet 11  . Multiple Vitamins-Minerals (MULTIVITAMIN WITH MINERALS) tablet Take 1 tablet by mouth daily.    . sertraline (ZOLOFT) 50 MG tablet  TAKE 1 TABLET BY MOUTH DAILY 90 tablet 0  . traMADol (ULTRAM) 50 MG tablet Take 1 tablet (50 mg total) by mouth every 8 (eight) hours as needed. 25 tablet 1  . VENTOLIN HFA 108 (90 Base) MCG/ACT inhaler INHALE TWO PUFFS BY MOUTH EVERY 6 HOURS AS NEEDED 54 g 0   No current facility-administered medications for this visit.      Physical Exam:  BP (!) 143/94   Pulse 87   Temp 98.1 F (36.7 C) (Oral)   Resp 17   Ht  (1.753 m)   Wt (!) 305 lb (138.3 kg)   SpO2 98%   BMI 45.04 kg/m  Gen:  Patient sitting on exam table, appears stated age in no acute distress Head: Normocephalic atraumatic Eyes: EOMI, PERRL, sclera and conjunctiva non-erythematous Ears:  Canals clear bilaterally.  TMs pearly gray bilaterally without erythema or bulging.   Nose:  Nasal turbinates grossly enlarged bilaterally. Some exudates noted. Tender to palpation of maxillary sinus  Mouth: Mucosa membranes moist. Poor dental hygiene.  Does have very small abscess noted left second upper molar.  Multiple broken teeth.  No drainage currently.  No swelling.  No other oral lesions noted.  Neck: No cervical lymphadenopathy noted.  No swelling.  Heart:  RRR, no murmurs auscultated. Pulm:  Clear to auscultation bilaterally with good air movement.  No wheezes or rales noted.    Assessment and Plan:  1.  Dental lesions: - gum abscess, very small, self-expressed - plan to treat with clindamycin as he is allergic to penicillins.  - FU with dentist.   - Should resolve with abx.  - no evidence of extension to throat

## 2017-01-17 DIAGNOSIS — F4010 Social phobia, unspecified: Secondary | ICD-10-CM | POA: Diagnosis not present

## 2017-01-19 ENCOUNTER — Ambulatory Visit (INDEPENDENT_AMBULATORY_CARE_PROVIDER_SITE_OTHER): Payer: BLUE CROSS/BLUE SHIELD | Admitting: Neurology

## 2017-01-19 DIAGNOSIS — G4710 Hypersomnia, unspecified: Secondary | ICD-10-CM

## 2017-01-19 DIAGNOSIS — F513 Sleepwalking [somnambulism]: Secondary | ICD-10-CM

## 2017-01-19 DIAGNOSIS — R0683 Snoring: Secondary | ICD-10-CM

## 2017-01-23 NOTE — Addendum Note (Signed)
Addended by: Melvyn NovasHMEIER, Kennita Pavlovich on: 01/23/2017 03:27 PM   Modules accepted: Orders

## 2017-01-23 NOTE — Procedures (Signed)
[facility], [address], [place], [place] [id number] NAME: Brian Alvarez   DOB: May 09, 1986 MEDICAL RECORD [id number]    DOS: 01/19/17 REFERRING PHYSICIAN: Benny LennertSarah Weber, PA-C Study performed: HST/Out of Center Sleep test HISTORY: Brian Alvarez is a 31 year old male patient , seen here as a referral from PA Weber for a sleep study .He presents because of increased tiredness and lack of energy; daytime sleepiness and trouble with falling asleep and staying awake in daytime. Some days he will have conversations, even eats breakfast in the presence of his fiance, and then cannot recollect it hours later. Apparently he goes back to sleep or has never been awake. He feels his fatigue is so severe that he cannot function well, but his employer allows him flexible hours.  His fiance has also witnessed his " sleep talking" about things that make no sense to her and neither to him. At times he wants to discuss the " harvest ", but he plants nothing. He has not enacted dreams, yet he does leave the bed - walks to the couch without recollecting this later.     Epworth Sleepiness score endorsed at 10 points.  Fatigue severity score at 33 points, depression score 1/15 points.  BMI 45   STUDY RESULTS: Total Recording Time: 7h 4min. , Total Apnea/Hypopnea Index (AHI) 3.5 /hr. Average Oxygen Saturation: 94 %. Lowest Oxygen Saturation: 88%. Average Heart Rate: 71 bpm between 53 and 103 bpm variability, NSR.   IMPRESSION: Parasomnia cannot be captured in this HST, but apnea can be ruled out.  The RDI was 5.3/ hr. was indicative of snoring. RECOMMENDATION: Attended sleep study with parasomnia montage to rule out sleep hallucinations, PLMs and abnormal REM sleep.  I certify that I have reviewed the raw data recording prior to the issuance of this report in accordance with the standards of Accreditation of the American Academy of Sleep medicine (AASM) Melvyn Novasarmen Adalaide Jaskolski, MD                 01-22-2017  Diplomat, American Board of Psychiatry and Neurology Diplomat, American Board of Sleep Medicine Medical Director, [facility]

## 2017-01-24 ENCOUNTER — Telehealth: Payer: Self-pay

## 2017-01-24 NOTE — Telephone Encounter (Signed)
-----   Message from Melvyn Novasarmen Dohmeier, MD sent at 01/23/2017  3:27 PM EDT ----- Very very mild apnea, snoring  And no explanation for his sleepiness. I will order an attended sleep study with parasomnia montage.

## 2017-01-24 NOTE — Telephone Encounter (Signed)
I called pt. I advised pt that Dr. Vickey Hugerohmeier reviewed their sleep study results and found that pt has very mild osa and snoring. Dr. Vickey Hugerohmeier recommends that pt return for an attended sleep study with parasomnia montage. I advised pt that our sleep lab will contact pt's insurance company to find out if this can be approved, and we will let the pt know either way. Pt is agreeable to this. Pt verbalized understanding of results. Pt had no questions at this time but was encouraged to call back if questions arise.

## 2017-01-27 DIAGNOSIS — F4010 Social phobia, unspecified: Secondary | ICD-10-CM | POA: Diagnosis not present

## 2017-02-02 DIAGNOSIS — F4010 Social phobia, unspecified: Secondary | ICD-10-CM | POA: Diagnosis not present

## 2017-02-10 ENCOUNTER — Ambulatory Visit (INDEPENDENT_AMBULATORY_CARE_PROVIDER_SITE_OTHER): Payer: BLUE CROSS/BLUE SHIELD | Admitting: Neurology

## 2017-02-10 DIAGNOSIS — F513 Sleepwalking [somnambulism]: Secondary | ICD-10-CM

## 2017-02-10 DIAGNOSIS — G4733 Obstructive sleep apnea (adult) (pediatric): Secondary | ICD-10-CM

## 2017-02-10 DIAGNOSIS — R0683 Snoring: Secondary | ICD-10-CM

## 2017-02-10 DIAGNOSIS — G478 Other sleep disorders: Secondary | ICD-10-CM

## 2017-02-15 DIAGNOSIS — F4010 Social phobia, unspecified: Secondary | ICD-10-CM | POA: Diagnosis not present

## 2017-02-22 NOTE — Procedures (Addendum)
PATIENT'S NAME:  Brian Alvarez, Brian Alvarez DOB:      Feb 12, 1986      MR#:    [id number]     DATE OF RECORDING: 02/10/2017 REFERRING M.D.:  Benny Lennert, PA Study Performed:  Diagnostic Polysomnogram with parasomnia montage  HISTORY:  [name] is a 31 year old male patient, seen here as a referral from PA Benny Lennert .  He presents because of increased tiredness and lack of energy; daytime sleepiness and trouble with falling asleep and staying awake in daytime. Some days he will have conversations, even eats breakfast in the presence of his fiance, and then cannot recollect it hours later. Apparently he goes back to sleep or has never been awake. He feels his fatigue is so severe that he cannot function well, but his employer allows him flexible hours.  His fiance has also witnessed his " sleep talking" about things that make no sense to her and neither to him. He has not enacted dreams, yet he does leave the bed - walks to the couch without recollecting this later. Hypertension, super obesity, ADD/ ADHD, asthma, sleep walking and talking, PLMs.  The patient endorsed the Epworth Sleepiness Scale at 10 points.   The patient's weight 304 pounds with a height of 69 (inches), resulting in a BMI of 45.1 kg/m2. The patient's neck circumference measured 18 inches.  CURRENT MEDICATIONS: Adderall, Epipen, Fluticasone Furoate-Vilanterol, Vistaril, ADVIL, Xyzal, Singulair, Multivitamins, Bactroban, Protonix, Zoloft, Ultram   PROCEDURE:  This is a multichannel digital polysomnogram utilizing the Somnostar 11.2 system.  Electrodes and sensors were applied and monitored per AASM Specifications.   EEG, EOG, Chin and Limb EMG, were sampled at 200 Hz.  ECG, Snore and Nasal Pressure, Thermal Airflow, Respiratory Effort, CPAP Flow and Pressure, Oximetry was sampled at 50 Hz. Digital video and audio were recorded.      BASELINE STUDY  Lights Out was at 22:46 and Lights On at 05:06.  Total recording time (TRT) was 381 minutes,  with a total sleep time (TST) of 363.5 minutes.   The patient's sleep latency was 10 minutes.  REM latency was 317 minutes.  The sleep efficiency was 95.4 %.     SLEEP ARCHITECTURE: WASO (Wake after sleep onset) was 8.5 minutes.  There were 8.5 minutes in Stage N1, 279.5 minutes Stage N2, 30 minutes Stage N3 and 45.5 minutes in Stage REM.  The percentage of Stage N1 was 2.3%, Stage N2 was 76.9%, Stage N3 was 8.3% and Stage R (REM sleep) was 12.5%.    RESPIRATORY ANALYSIS:  There were a total of 26 respiratory events:  6 obstructive apneas, 0 central apneas and 2 mixed apneas with a total of 8 apneas and an apnea index (AI) of 1.3 /hour. There were 21 hypopneas with a hypopnea index of 3.6/hr. The patient also had 8 respiratory event related arousals (RERAs).      The total APNEA/HYPOPNEA INDEX (AHI) was 5.6 hour and the total RESPIRATORY DISTURBANCE INDEX was 6.4 /hr.  6 events occurred in REM sleep and 31 events in NREM. The REM AHI was 7.9 /hour, versus a non-REM AHI of 3.8. The patient spent 220.5 minutes of total sleep time in the supine position and 143 minutes in non-supine. The supine AHI was 5.5 versus a non-supine AHI of 2.6.  OXYGEN SATURATION & C02:  The Wake baseline 02 saturation was 93%, with the lowest being 82%. Time spent below 89% saturation equaled 1 minute.   PERIODIC LIMB MOVEMENTS:  The patient had a  total of 11 Periodic Limb Movements.  The arousals were noted as: 100 were spontaneous, 9 were associated with PLMs, and  28were associated with respiratory events.  Audio and video analysis did not show any abnormal or unusual movements, behaviors, phonations or vocalizations.  EKG was in keeping with normal sinus rhythm (NSR). The Periodic Limb Movement (PLM) index was 1.8 and the PLM Arousal index was 1.5/hour. Some PLMs were seen during REM sleep.    IMPRESSION: Many spontaneous arousals were noted. I could correlate only few of these to physiological dysfunction. The patient  had snoring related arousals and mild apnea.   Normal EKG.  PLMs were mild and not significantly contributing to arousals.  PLMs were seen during REM sleep, about 50% reduced to NREM sleep frequency. We have not captured sleep walking. I reviewed the video and audio- no sleep talking captured.   1. Dysfunctions associated with sleep stages or arousal from sleep 2. Repetitive Intrusions of Sleep  RECOMMENDATIONS: I will recommend a CPAP titration, a gentle titration by 1 cm pressure intervals.   1. A follow up appointment will be scheduled in the Sleep Clinic at [facility]. The referring provider will be notified of the results.      I certify that I have reviewed the entire raw data recording prior to the issuance of this report in accordance with the Standards of Accreditation of the American Academy of Sleep Medicine (AASM)      Melvyn Novasarmen Silus Lanzo, MD  02-22-2017  Diplomat, American Board of Psychiatry and Neurology  Diplomat, American Board of Sleep Medicine Medical Director, [facility]

## 2017-02-24 ENCOUNTER — Telehealth: Payer: Self-pay

## 2017-02-24 NOTE — Addendum Note (Signed)
Addended by: Melvyn NovasHMEIER, Atalia Litzinger on: 02/24/2017 07:11 PM   Modules accepted: Orders

## 2017-02-24 NOTE — Telephone Encounter (Signed)
I called pt to discuss his sleep study results. No answer, left a message asking him to call me back. 

## 2017-02-24 NOTE — Telephone Encounter (Signed)
-----   Message from Melvyn Novasarmen Dohmeier, MD sent at 02/22/2017 11:06 AM EDT ----- I like to meet with Mr [name] about this result. CD

## 2017-02-28 DIAGNOSIS — F4010 Social phobia, unspecified: Secondary | ICD-10-CM | POA: Diagnosis not present

## 2017-03-01 NOTE — Telephone Encounter (Signed)
I called pt. I advised him that Dr. Vickey Hugerohmeier reviewed pt's sleep study and found that pt does have mild sleep apnea, no sleep walking, but that a gentle cpap titration may be an option for the pt, but that Dr. Vickey Hugerohmeier wants to meet with the pt to discuss these results. Pt is agreeable to this. Dr. Vickey Hugerohmeier is currently booked until the end of September but I advised pt that I will call him if there is a cancellation in July. Pt will wait on the cpap titration study until he meets with Dr. Vickey Hugerohmeier. Pt verbalized understanding of results. Pt had no questions at this time but was encouraged to call back if questions arise. Pt has been placed on the wait list.

## 2017-03-04 ENCOUNTER — Ambulatory Visit: Payer: BLUE CROSS/BLUE SHIELD | Admitting: Family Medicine

## 2017-03-04 ENCOUNTER — Ambulatory Visit (INDEPENDENT_AMBULATORY_CARE_PROVIDER_SITE_OTHER): Payer: BLUE CROSS/BLUE SHIELD | Admitting: Physician Assistant

## 2017-03-04 ENCOUNTER — Encounter: Payer: Self-pay | Admitting: Physician Assistant

## 2017-03-04 VITALS — BP 135/88 | HR 82 | Temp 98.0°F | Resp 18 | Ht 68.5 in | Wt 304.0 lb

## 2017-03-04 DIAGNOSIS — G5603 Carpal tunnel syndrome, bilateral upper limbs: Secondary | ICD-10-CM | POA: Diagnosis not present

## 2017-03-04 NOTE — Progress Notes (Signed)
Brian Alvarez  MRN: [id number] DOB: 1986-09-17  PCP: Morrell Riddle, PA-C  Chief Complaint  Patient presents with  . Hand Pain    both hands and wrist for the last two months     Subjective:  Pt presents to clinic for concerns regarding bilateral hand and wrist pain.  Short bursts of problems for years but over the last 2 months the pain and sensation has gotten worse.  About 5 years ago he was diagnosed with carpal tunnel but it was job related and when that job stopped the pain got better.    Paresthesias and tingling in thumb through 3/4th digit bilaterally.  Pain is located anterior wrist into palm.  Am weakness - 2 hands to hold a cup but then seem to get some better in the am but then as the day goes on it gets worse.  Sleeps in braces but has not noticed that much difference.  Left>right  Work involves twisting repetitive work with both hands and picking up small items.  He sleeps in bilateral wrist braces but does not feel like they really help.  He is unable to work in braces due to fine movement need.  Review of Systems  Neurological: Positive for weakness and numbness.    Patient Active Problem List   Diagnosis Date Noted  . PLMD (periodic limb movement disorder) 11/24/2016  . Morbid obesity with body mass index of 45.0-49.9 in adult (HCC) 11/24/2016  . Hypersomnia with sleep apnea 11/24/2016  . Snoring 11/24/2016  . Asthma exacerbation 10/19/2016  . Attention deficit hyperactivity disorder (ADHD) 04/29/2016  . Seasonal allergies 09/12/2012  . Asthma 12/26/2011  . Carpal tunnel syndrome 11/11/2011    Current Outpatient Prescriptions on File Prior to Visit  Medication Sig Dispense Refill  . acetaminophen (TYLENOL) 325 MG tablet Take 650 mg by mouth every 6 (six) hours as needed.    Marland Kitchen EPINEPHrine (EPI-PEN) 0.3 mg/0.3 mL DEVI Inject 0.3 mLs (0.3 mg total) into the muscle once. 2 Device 0  . Fluticasone Furoate-Vilanterol 200-25 MCG/INH AEPB Inhale 1 puff into the  lungs daily.     . hydrOXYzine (ATARAX/VISTARIL) 25 MG tablet Take 0.5-1 tablets (12.5-25 mg total) by mouth at bedtime as needed for itching. 30 tablet 0  . ibuprofen (ADVIL,MOTRIN) 200 MG tablet Take 600 mg by mouth every 6 (six) hours as needed.     Marland Kitchen levocetirizine (XYZAL) 5 MG tablet Take 5 mg by mouth every evening.    . montelukast (SINGULAIR) 10 MG tablet Take one daily at bedtime 30 tablet 11  . Multiple Vitamins-Minerals (MULTIVITAMIN WITH MINERALS) tablet Take 1 tablet by mouth daily.    . sertraline (ZOLOFT) 50 MG tablet TAKE 1 TABLET BY MOUTH DAILY 90 tablet 0  . traMADol (ULTRAM) 50 MG tablet Take 1 tablet (50 mg total) by mouth every 8 (eight) hours as needed. 25 tablet 1  . VENTOLIN HFA 108 (90 Base) MCG/ACT inhaler INHALE TWO PUFFS BY MOUTH EVERY 6 HOURS AS NEEDED 54 g 0  . amphetamine-dextroamphetamine (ADDERALL) 10 MG tablet Take 1 tablet (10 mg total) by mouth 2 (two) times daily with a meal. 60 tablet 0   No current facility-administered medications on file prior to visit.     Allergies  Allergen Reactions  . Penicillins     Hives Has patient had a PCN reaction causing immediate rash, facial/tongue/throat swelling, SOB or lightheadedness with hypotension: yes Has patient had a PCN reaction causing severe rash involving mucus membranes or  skin necrosis: yes Has patient had a PCN reaction that required hospitalization: no Has patient had a PCN reaction occurring within the last 10 years: yes If all of the above answers are "NO", then may proceed with Cephalosporin use.  Marland Kitchen. Rosemary Oil     Pt patients past, family and social history were reviewed and updated.   Objective:  BP 135/88   Pulse 82   Temp 98 F (36.7 C) (Oral)   Resp 18   Ht 5' 8.5" (1.74 m)   Wt (!) 304 lb (137.9 kg)   SpO2 95%   BMI 45.55 kg/m   Physical Exam  Constitutional: He is oriented to person, place, and time and well-developed, well-nourished, and in no distress.  HENT:  Head:  Normocephalic and atraumatic.  Right Ear: External ear normal.  Left Ear: External ear normal.  Eyes: Conjunctivae are normal.  Neck: Normal range of motion.  Pulmonary/Chest: Effort normal.  Musculoskeletal:  Bilateral tinels and phalens positive. Pain with palpation of anterior wrists bilaterall.  Decreased sensation pads of thumb through 4th digit - worse on the right.  Neurological: He is alert and oriented to person, place, and time. Gait normal.  Skin: Skin is warm and dry.  Psychiatric: Mood, memory, affect and judgment normal.    Assessment and Plan :  Carpal tunnel syndrome on both sides - Plan: Ambulatory referral to Hand Surgery  Pt to continue to use night splints.  Prednisone was discussed but this will be a short term solution for the patient and the risk outweigh the possible benefits.  He can try ice.  He would like to look into further evaluation.  About 5 years ago he had nerve conduction studies at a neurologist that gave him the diagnosis of carpal tunnel.  Benny LennertSarah Lance Galas PA-C  Primary Care at [facility] 03/07/2017 12:00 PM

## 2017-03-04 NOTE — Patient Instructions (Signed)
     IF you received an x-ray today, you will receive an invoice from  Radiology. Please contact  Radiology at [phone] with questions or concerns regarding your invoice.   IF you received labwork today, you will receive an invoice from [facility]. Please contact [facility] at [phone] with questions or concerns regarding your invoice.   Our billing staff will not be able to assist you with questions regarding bills from these companies.  You will be contacted with the lab results as soon as they are available. The fastest way to get your results is to activate your My Chart account. Instructions are located on the last page of this paperwork. If you have not heard from us regarding the results in 2 weeks, please contact this office.     

## 2017-03-09 ENCOUNTER — Telehealth: Payer: Self-pay | Admitting: Neurology

## 2017-03-09 DIAGNOSIS — G4733 Obstructive sleep apnea (adult) (pediatric): Secondary | ICD-10-CM

## 2017-03-09 NOTE — Telephone Encounter (Signed)
[employer] denied CPAP approved auto pap °

## 2017-03-15 DIAGNOSIS — F4010 Social phobia, unspecified: Secondary | ICD-10-CM | POA: Diagnosis not present

## 2017-03-15 NOTE — Telephone Encounter (Signed)
Placed order for auto CPAP 5-20 cm water.

## 2017-03-15 NOTE — Addendum Note (Signed)
Addended by: Melvyn NovasHMEIER, Dazha Kempa on: 03/15/2017 05:56 PM   Modules accepted: Orders

## 2017-03-16 NOTE — Telephone Encounter (Signed)
I called pt. I advised pt that [employer] denied p'ts titration in lab study. Dr. Vickey Hugerohmeier recommends that pt start an auto pap at home. I reviewed PAP compliance expectations with the pt. Pt is agreeable to starting a CPAP. I advised pt that an order will be sent to a DME, [facility], and [facility] will call the pt within about one week after they file with the pt's insurance. [facility] will show the pt how to use the machine, fit for masks, and troubleshoot the CPAP if needed. A follow up appt was made for insurance purposes with Dr. Vickey Hugerohmeier on 07/04/17 at 2:30pm. Pt verbalized understanding to arrive 15 minutes early and bring their CPAP. A letter with all of this information in it will be mailed to the pt as a reminder. I verified with the pt that the address we have on file is correct. Pt verbalized understanding of results. Pt had no questions at this time but was encouraged to call back if questions arise.

## 2017-03-25 DIAGNOSIS — S61210A Laceration without foreign body of right index finger without damage to nail, initial encounter: Secondary | ICD-10-CM | POA: Diagnosis not present

## 2017-03-28 ENCOUNTER — Ambulatory Visit (INDEPENDENT_AMBULATORY_CARE_PROVIDER_SITE_OTHER): Payer: BLUE CROSS/BLUE SHIELD | Admitting: Neurology

## 2017-03-28 ENCOUNTER — Encounter: Payer: Self-pay | Admitting: Neurology

## 2017-03-28 VITALS — BP 133/90 | HR 76 | Ht 69.0 in | Wt 309.0 lb

## 2017-03-28 DIAGNOSIS — R0683 Snoring: Secondary | ICD-10-CM | POA: Diagnosis not present

## 2017-03-28 DIAGNOSIS — G4751 Confusional arousals: Secondary | ICD-10-CM | POA: Diagnosis not present

## 2017-03-28 MED ORDER — PRAZOSIN HCL 1 MG PO CAPS: 1.0000 mg | ORAL_CAPSULE | Freq: Every day | ORAL | 0 refills | Status: DC

## 2017-03-28 NOTE — Progress Notes (Signed)
SLEEP MEDICINE CLINIC   Provider:  Melvyn Novas, M D  Referring Provider: Larkin Ina Primary Care Physician:  Morrell Riddle, PA-C  Chief Complaint  Patient presents with  . Follow-up    HPI:  Revisit from 03-28-2017 for this patient of Brian Alvarez . He underwent a HST on  01-23-2017 and this resulted in an AHI of 3.5. Apnea was ruled out. He continued to complain of confusional arousals and parasomnia. Returned for a video attended sleep study on 03-06-2017 . He slept well, felt refreshed the next morning.  His PSG parasomnia result included no capture of sleep taking, walking, an AHI of 5.6 and  Epworth 10 points. He could use Prazosin, Melatonin and of course, CPAP to see if an affect on Epworth is possible.       Brian Alvarez is a 31 y.o. male , seen here as a referral from PA Weber for a sleep study.  Dear Maralyn Sago, Thank you for sending Mr. Stainback my way,  As you know, he is a 31 year old caucasian, right handed male patient , engaged to be married soon -on December 18 2016. He is morbidly obese. He just overcame pneumonia.  He has asthma,  He presents here today because of a feeling of increased tiredness and lack of energy daytime sleepiness and trouble with falling asleep and staying awake in daytime. Some days he will sleep throughout the day, but most scary and confusing to him is that he apparently can have conversations even eats breakfast in the presence of his fiance and then cannot recollect it hours later. Apparently he goes back to sleep or has never been awake. He has never been a sleep walker not in his childhood not as an adult, he may have had a couple of night terror episodes but no regular encounters. Very rarely has he had such vivid and scary dreams that he would wake up with palpitations, diaphoresis and feeling scared. He feels that in the mornings his tiredness, his leaden fatigue is so severe that he cannot function in the workplace area, but  his current  employer allows him a lot of liberty with flexible hours.  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 He works in a [employer]. 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   Sleep habits are as follows: He reports that his bedtime is between 10 and midnight, but he may enter sleep hours later. He avoids looking at the clock, and he usually leaves the bed when he feels that he struggles to go to sleep and distract himself with something relaxing and entertaining. He has noted a better chance then to initiate sleep a little later. He sometimes has vivid dreams but these are exceptions. He usually once asleep can stay asleep for about 6 hours, but he may sleep up to 10 hours straight he does not necessarily have to go to the bathroom he does not wake up with shortness of breath diaphoresis, palpitations or headaches. No dizziness or nausea. If he has to stop working at a certain hour or has to keep an appointment in the morning he will set an alarm - he has managed to sleep through those. The best arrangement is usually when his fiance is asked to wake him. It is very difficult to arouse him in the morning and as he described earlier he may act awake, but isn't !  His fiance has also witnessed him sleep talking about things that make no sense to her and neither to him. At times he wants to discuss , for example the " harvest ", but he plants nothing. He has not an activated to dreams however he does  leave the bed - walks to the couch without recollecting this later.  He does not kick, thrash or yell.   Sleep medical history and family sleep history:   The patient's mother is not known to have sleep disorders, but she has struggled with addiction. He does not know his father well, neither his paternal health history. The patient has not had concussions or contusions, traumatic brain injuries, neck injuries no ENT trauma facial injuries or  surgeries. 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 Non tobacco user ( quit in  March 2015) , not longer drinking, only 1 beer a month. caffeine daily - coffee,tea, sodas.   Review of Systems: Out of a complete 14 system review, the patient complains of only the following symptoms, and all other reviewed systems are negative. Hypersomnia in daytime, with insomnia at nighttime, daytime fatigue the feeling of loss of energy and of not being refreshed or restored in the morning. He does not wake up with headaches but with a dry mouth and occasional.  ADHD,  On  SSRI  and adderall.  Snoring ?   Epworth score 10.  Watching TV, as a passenger, in the afternoon. , Fatigue severity score 33  , depression score 1/1 5    Social History   Social History  . Marital status: Married    Spouse name: N/A  . Number of children: N/A  . Years of education: N/A   Occupational History  . Not on file.   Social History Main Topics  . Smoking status: Former Games developermoker  . Smokeless tobacco: Never Used     Comment: has switched to electronic cigarette  . Alcohol use Yes     Comment: occassional  . Drug use: No  . Sexual activity: Yes   Other Topics Concern  . Not on file   Social History Narrative  . No narrative on file    Family History  Problem Relation Age of Onset  . Arthritis Maternal Grandmother   . Heart disease Maternal Grandfather     Past Medical History:  Diagnosis Date  . Allergy   . Anemia   . Asthma   . Carpal tunnel syndrome   . Carpal tunnel syndrome     No past surgical history on file.  Current Outpatient Prescriptions  Medication Sig Dispense Refill  . acetaminophen (TYLENOL) 325 MG tablet Take 650 mg by mouth every 6 (six) hours as needed.    Marland Kitchen. EPINEPHrine (EPI-PEN) 0.3 mg/0.3 mL DEVI Inject 0.3 mLs (0.3 mg total) into the muscle once. 2 Device 0  . Fluticasone Furoate-Vilanterol 200-25 MCG/INH AEPB Inhale 1 puff into the lungs daily.     .  hydrOXYzine (ATARAX/VISTARIL) 25 MG tablet Take 0.5-1 tablets (12.5-25 mg total) by mouth at bedtime as needed for itching. 30 tablet 0  . ibuprofen (ADVIL,MOTRIN) 200 MG tablet Take 600 mg by mouth every 6 (six) hours as needed.     Marland Kitchen. levocetirizine (XYZAL) 5 MG tablet Take 5 mg by mouth every evening.    . montelukast (SINGULAIR) 10 MG tablet Take one daily at bedtime 30 tablet 11  . Multiple Vitamins-Minerals (MULTIVITAMIN WITH MINERALS) tablet Take 1 tablet by mouth daily.    .Marland Kitchen  sertraline (ZOLOFT) 50 MG tablet TAKE 1 TABLET BY MOUTH DAILY 90 tablet 0  . traMADol (ULTRAM) 50 MG tablet Take 1 tablet (50 mg total) by mouth every 8 (eight) hours as needed. 25 tablet 1  . VENTOLIN HFA 108 (90 Base) MCG/ACT inhaler INHALE TWO PUFFS BY MOUTH EVERY 6 HOURS AS NEEDED 54 g 0  . amphetamine-dextroamphetamine (ADDERALL) 10 MG tablet Take 1 tablet (10 mg total) by mouth 2 (two) times daily with a meal. 60 tablet 0   No current facility-administered medications for this visit.     Allergies as of 03/28/2017 - Review Complete 03/28/2017  Allergen Reaction Noted  . Penicillins  10/17/2012  . Rosemary oil  10/19/2016    Vitals: BP 133/90   Pulse 76   Ht 5\' 9"  (1.753 m)   Wt (!) 309 lb (140.2 kg)   BMI 45.63 kg/m  Last Weight:  Wt Readings from Last 1 Encounters:  03/28/17 (!) 309 lb (140.2 kg)   GEX:BMWU mass index is 45.63 kg/m.     Last Height:   Ht Readings from Last 1 Encounters:  03/28/17 5\' 9"  (1.753 m)    Physical exam:  General: The patient is awake, alert and appears not in acute distress. The patient has full facial hair. Head: Normocephalic, atraumatic. Neck is supple. Mallampati 3,  neck circumference:18.  Nasal airflow patent , TMJ click evident . Retrognathia is seen.  Cardiovascular:  Regular rate and rhythm , without  murmurs or carotid bruit, and without distended neck veins. Respiratory: Lungs are clear to auscultation. Skin:  Without evidence of edema, or rash Trunk:  BMI is 45 . The patient's posture is stooped. He is portly.    Neurologic exam : The patient is awake and alert, oriented to place and time.  Attention span & concentration ability appears normal.  Speech is fluent,  without dysarthria, dysphonia or aphasia.  Mood and affect are dramatic.  Cranial nerves: Pupils are equalreactive to light. Extraocular movements  in vertical and horizontal planes intact and without nystagmus. Visual fields by finger perimetry are intact. Hearing to finger rub intact.   Facial sensation intact to fine touch.  Facial motor strength is symmetric and tongue and uvula move midline. Shoulder shrug was symmetrical.      Assessment:  After physical and neurologic examination, review of laboratory studies,  Personal review of imaging studies, reports of other /same  Imaging studies, my assessment is     Dear Maralyn Sago, I had the pleasure of meeting your patient Mr. Cozzens today who does have several risk factors for the presence of obstructive sleep apnea, including a high body mass index above 45, consistent with an a and a criteria for superobesity. He has recently overcome pneumonia, he has allergic asthmatic airway disease, reactive airway disease, he has a high-grade Mallampati and a large neck circumference. These are his risk factors. In addition he has been witnessed to snore very loudly, he does have crescendo snoring, with pauses.  There is also a secondary sleep disorder, he is restless, periodically moves his limbs, this condition is known as PLM    He is not always awake when he acts awake and parasomnia activity. We were unable to capture this activity during the sleep study . He has alergic asthma. He has trouble waking up in AM.     Plan:  Treatment plan and additional workup :  Plan A however would be the medication prazosin, also known as Minipress. This medication will lower  his blood pressure but also limits the frequency of confusional arousals. It  has been used in PTSD as well as an confusional arousals. I have decided to give him a 30 day supply, and after this trial. We will see how his physical and mental response to the medication has been assessed.  My plan B  for Mr. Kramp is to achieve less restless sleep, less snoring and to treat even his mild apnea. I think he has an only 30% chance of feeling more rested given that his apnea is unusually mild. I would give him an auto CPAP for 30 day. He will purchase a wake up alarm which emits light.    The patient was advised of the nature of the diagnosed sleep disorder , the treatment options and risks for general a health and wellness arising from not treating the condition.  I spent more than 25  minutes of face to face time with the patient. Greater than 50% of time was spent in counseling and coordination of care. We have discussed the diagnosis and differential and I answered the patient's questions.    Rv in  60  days with NP.   Porfirio Mylar Lashea Goda MD  03/28/2017   CC: Morrell Riddle, Pa-c [address] [place], [place] [address]

## 2017-03-28 NOTE — Patient Instructions (Signed)
Prazosin capsules 1 mg daily ( at bedtime )  What is this medicine? PRAZOSIN (PRA zoe sin) is an antihypertensive. It works by relaxing the blood vessels. It is used to treat high blood pressure. This medicine may be used for other purposes; ask your health care provider or pharmacist if you have questions. COMMON BRAND NAME(S): Minipress What should I tell my health care provider before I take this medicine? They need to know if you have any of the following conditions: -kidney disease -an unusual or allergic reaction to prazosin, other medicines, foods, dyes, or preservatives -pregnant or trying to get pregnant -breast-feeding How should I use this medicine? Take this medicine by mouth with a glass of water. Follow the directions on the prescription label. Take your doses at regular intervals. Do not take your medicine more often than directed. Do not stop taking except on the advice of your doctor or health care professional. Talk to your pediatrician regarding the use of this medicine in children. Special care may be needed. Overdosage: If you think you have taken too much of this medicine contact a poison control center or emergency room at once. NOTE: This medicine is only for you. Do not share this medicine with others. What if I miss a dose? If you miss a dose, take it as soon as you can. If it is almost time for your next dose, take only that dose. Do not take double or extra doses. What may interact with this medicine? -diuretics -medicines for high blood pressure -sildenafil -tadalafil -vardenafil This list may not describe all possible interactions. Give your health care provider a list of all the medicines, herbs, non-prescription drugs, or dietary supplements you use. Also tell them if you smoke, drink alcohol, or use illegal drugs. Some items may interact with your medicine. What should I watch for while using this medicine? Visit your doctor or health care professional for  regular checks on your progress. Check your blood pressure regularly. Ask your doctor or health care professional what your blood pressure should be and when you should contact him or her. Drowsiness and dizziness are more likely to occur after the first dose, after an increase in dose, or during hot weather or exercise. These effects can decrease once your body adjusts to this medicine. Do not drive, use machinery, or do anything that needs mental alertness until you know how this drug affects you. Do not stand or sit up quickly, especially if you are an older patient. This reduces the risk of dizzy or fainting spells. Alcohol can make you more drowsy and dizzy. Avoid alcoholic drinks. Do not treat yourself for coughs, colds or allergies without asking your doctor or health care professional for advice. Some ingredients can increase your blood pressure. Your mouth may get dry. Chewing sugarless gum or sucking hard candy, and drinking plenty of water may help. Contact your doctor if the problem does not go away or is severe. For males, contact your doctor or health care professional right away if you have an erection that lasts longer than 4 hours or if it becomes painful. This may be a sign of a serious problem and must be treated right away to prevent permanent damage. What side effects may I notice from receiving this medicine? Side effects that you should report to your doctor or health care professional as soon as possible: -blurred vision -difficulty breathing, shortness of breath -fainting spells, light headedness -fast or irregular heartbeat, palpitations or chest pain -numbness in  hands or feet -prolonged painful erection of the penis -swelling of the legs or ankles -unusually weak or tired Side effects that usually do not require medical attention (report to your doctor or health care professional if they continue or are bothersome): -constipation or diarrhea -headache -nausea -sexual  difficulties -stomach pain This list may not describe all possible side effects. Call your doctor for medical advice about side effects. You may report side effects to FDA at 1-800-FDA-1088. Where should I keep my medicine? Keep out of the reach of children. Store at room temperature between 15 and 30 degrees C (59 and 86 degrees F). Protect from light. Keep container tightly closed. Throw away any unused medicine after the expiration date. NOTE: This sheet is a summary. It may not cover all possible information. If you have questions about this medicine, talk to your doctor, pharmacist, or health care provider.  2018 Elsevier/Gold Standard (2014-03-04 09:13:50)

## 2017-03-30 DIAGNOSIS — F4010 Social phobia, unspecified: Secondary | ICD-10-CM | POA: Diagnosis not present

## 2017-04-26 DIAGNOSIS — F4010 Social phobia, unspecified: Secondary | ICD-10-CM | POA: Diagnosis not present

## 2017-05-11 DIAGNOSIS — F4010 Social phobia, unspecified: Secondary | ICD-10-CM | POA: Diagnosis not present

## 2017-05-24 ENCOUNTER — Ambulatory Visit (INDEPENDENT_AMBULATORY_CARE_PROVIDER_SITE_OTHER): Payer: BLUE CROSS/BLUE SHIELD | Admitting: Physician Assistant

## 2017-05-24 ENCOUNTER — Encounter: Payer: Self-pay | Admitting: Physician Assistant

## 2017-05-24 VITALS — BP 122/80 | HR 78 | Temp 98.1°F | Resp 18 | Ht 69.0 in | Wt 313.8 lb

## 2017-05-24 DIAGNOSIS — F900 Attention-deficit hyperactivity disorder, predominantly inattentive type: Secondary | ICD-10-CM

## 2017-05-24 DIAGNOSIS — Z23 Encounter for immunization: Secondary | ICD-10-CM

## 2017-05-24 MED ORDER — AMPHETAMINE-DEXTROAMPHET ER 5 MG PO CP24: 5.0000 mg | ORAL_CAPSULE | Freq: Every day | ORAL | 0 refills | Status: DC

## 2017-05-24 NOTE — Patient Instructions (Signed)
     IF you received an x-ray today, you will receive an invoice from [facility]. Please contact  Radiology at [phone] with questions or concerns regarding your invoice.   IF you received labwork today, you will receive an invoice from [facility]. Please contact [facility] at [phone] with questions or concerns regarding your invoice.   Our billing staff will not be able to assist you with questions regarding bills from these companies.  You will be contacted with the lab results as soon as they are available. The fastest way to get your results is to activate your My Chart account. Instructions are located on the last page of this paperwork. If you have not heard from us regarding the results in 2 weeks, please contact this office.     

## 2017-05-24 NOTE — Progress Notes (Signed)
Brian LernerJames Brian Alvarez  MRN: [id number] DOB: Jan 10, 1986  PCP: Morrell RiddleWeber, Brian L, PA-C  Chief Complaint  Patient presents with  . Medication Refill    adderall    Subjective:  Pt presents to clinic for medication refill.  He does not take his Adderall daily but he knows when he takes it he does perform better at work.  He is partially afraid to take it due to substance abuse problems in his family.  He takes from 1/2- 1 qd-bid - he has tried 1 po bid but that makes him feel bad so he has not done that again.  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  When he takes the Adderall he likes that it works fast and he feels less cluttered and less distracted.  He sometimes wonders if he wants to be on a non-stimulant ADD medication but then he worries that it will not work.  He also does not want to have to take a medication daily.  Wrist and hand numbness is much better since switching jobs.    History is obtained by patient.  Review of Systems  Constitutional: Negative for appetite change, chills, fever and unexpected weight change.  Respiratory: Negative for cough.   Cardiovascular: Negative for chest pain and leg swelling.  Psychiatric/Behavioral: Negative for sleep disturbance.    Patient Active Problem List   Diagnosis Date Noted  . Confusional arousals 03/28/2017  . PLMD (periodic limb movement disorder) 11/24/2016  . Morbid obesity with body mass index of 45.0-49.9 in adult (HCC) 11/24/2016  . Hypersomnia with sleep apnea 11/24/2016  . Snoring 11/24/2016  . Asthma exacerbation 10/19/2016  . Attention deficit hyperactivity disorder (ADHD) 04/29/2016  . Seasonal allergies 09/12/2012  . Asthma 12/26/2011  . Carpal tunnel syndrome 11/11/2011    Current Outpatient Prescriptions on File Prior to Visit  Medication Sig Dispense Refill  . EPINEPHrine (EPI-PEN) 0.3 mg/0.3 mL DEVI Inject 0.3 mLs (0.3 mg total) into the muscle  once. 2 Device 0  . Fluticasone Furoate-Vilanterol 200-25 MCG/INH AEPB Inhale 1 puff into the lungs daily.     . hydrOXYzine (ATARAX/VISTARIL) 25 MG tablet Take 0.5-1 tablets (12.5-25 mg total) by mouth at bedtime as needed for itching. 30 tablet 0  . ibuprofen (ADVIL,MOTRIN) 200 MG tablet Take 600 mg by mouth every 6 (six) hours as needed.     Marland Kitchen. levocetirizine (XYZAL) 5 MG tablet Take 5 mg by mouth every evening.    . montelukast (SINGULAIR) 10 MG tablet Take one daily at bedtime 30 tablet 11  . Multiple Vitamins-Minerals (MULTIVITAMIN WITH MINERALS) tablet Take 1 tablet by mouth daily.    . sertraline (ZOLOFT) 50 MG tablet TAKE 1 TABLET BY MOUTH DAILY 90 tablet 0  . traMADol (ULTRAM) 50 MG tablet Take 1 tablet (50 mg total) by mouth every 8 (eight) hours as needed. 25 tablet 1  . VENTOLIN HFA 108 (90 Base) MCG/ACT inhaler INHALE TWO PUFFS BY MOUTH EVERY 6 HOURS AS NEEDED 54 g 0  . amphetamine-dextroamphetamine (ADDERALL) 10 MG tablet Take 1 tablet (10 mg total) by mouth 2 (two) times daily with a meal. 60 tablet 0   No current facility-administered medications on file prior to visit.     Allergies  Allergen Reactions  . Penicillins     Hives Has patient had a PCN reaction causing immediate rash, facial/tongue/throat swelling, SOB or lightheadedness with hypotension: yes Has patient had  a PCN reaction causing severe rash involving mucus membranes or skin necrosis: yes Has patient had a PCN reaction that required hospitalization: no Has patient had a PCN reaction occurring within the last 10 years: yes If all of the above answers are "NO", then may proceed with Cephalosporin use.  Marland Kitchen Judi Saa     Past Medical History:  Diagnosis Date  . Allergy   . Anemia   . Asthma   . Carpal tunnel syndrome   . Carpal tunnel syndrome    Social History   Social History Narrative  . No narrative on file   Social History  Substance Use Topics  . Smoking status: Former Games developer  . Smokeless  tobacco: Never Used     Comment: has switched to electronic cigarette  . Alcohol use Yes     Comment: occassional   family history includes Arthritis in his maternal grandmother; Drug abuse in his mother; Heart disease in his maternal grandfather.     Objective:  BP 122/80   Pulse 78   Temp 98.1 F (36.7 C) (Oral)   Resp 18   Ht 5\' 9"  (1.753 m)   Wt (!) 313 lb 12.8 oz (142.3 kg)   SpO2 97%   BMI 46.34 kg/m  Body mass index is 46.34 kg/m.  Physical Exam  Constitutional: He is oriented to person, place, and time and well-developed, well-nourished, and in no distress.  HENT:  Head: Normocephalic and atraumatic.  Right Ear: External ear normal.  Left Ear: External ear normal.  Eyes: Conjunctivae are normal.  Neck: Normal range of motion.  Pulmonary/Chest: Effort normal.  Neurological: He is alert and oriented to person, place, and time. Gait normal.  Skin: Skin is warm and dry.  Psychiatric: Mood, memory, affect and judgment normal.    Assessment and Plan :  Attention deficit hyperactivity disorder (ADHD), predominantly inattentive type - Plan: amphetamine-dextroamphetamine (ADDERALL XR) 5 MG 24 hr capsule - d/w pt at length his need for Adderall and different options.  Wellbutrin will not be good for him because he does not want to take a daily medication.  Strattera is an option but adderall really works well for him. We will try Adderall XR due to reduced abuse though he has shown no abuse of this medication.  We will start with a low dose that he can use up to 2 pills in the am with food depending on his results.  He should not have sleep issues due to early am taking it  He will be in contact with me regarding his results through mychart.  Need for influenza vaccination - Plan: Flu Vaccine QUAD 36+ mos IM  Benny Lennert PA-C  Primary Care at [facility] 05/24/2017 1:25 PM

## 2017-05-25 DIAGNOSIS — F4010 Social phobia, unspecified: Secondary | ICD-10-CM | POA: Diagnosis not present

## 2017-06-16 ENCOUNTER — Other Ambulatory Visit: Payer: Self-pay | Admitting: Physician Assistant

## 2017-06-23 ENCOUNTER — Encounter: Payer: Self-pay | Admitting: Physician Assistant

## 2017-06-25 NOTE — Telephone Encounter (Signed)
Printed - pt knows through [employer]

## 2017-06-28 MED ORDER — AMPHETAMINE-DEXTROAMPHET ER 10 MG PO CP24: 10.0000 mg | ORAL_CAPSULE | Freq: Every day | ORAL | 0 refills | Status: DC

## 2017-07-04 ENCOUNTER — Ambulatory Visit: Payer: Self-pay | Admitting: Neurology

## 2017-07-05 ENCOUNTER — Ambulatory Visit (INDEPENDENT_AMBULATORY_CARE_PROVIDER_SITE_OTHER): Payer: BLUE CROSS/BLUE SHIELD | Admitting: Adult Health

## 2017-07-05 ENCOUNTER — Encounter: Payer: Self-pay | Admitting: Adult Health

## 2017-07-05 VITALS — BP 132/82 | HR 76 | Ht 69.0 in | Wt 323.2 lb

## 2017-07-05 DIAGNOSIS — G4751 Confusional arousals: Secondary | ICD-10-CM | POA: Diagnosis not present

## 2017-07-05 NOTE — Patient Instructions (Signed)
  We will set you up on 30 day trial on CPAP  If your symptoms worsen or you develop new symptoms please let us know.   Thank you for coming to see [place] at [facility]. I hope we have been able to provide you high quality care today.  You may receive a patient satisfaction survey over the next few weeks. We would appreciate your feedback and comments so that we may continue to improve ourselves and the health of our patients.

## 2017-07-05 NOTE — Progress Notes (Signed)
I agree with the assessment and plan as directed by NP .The patient is known to me . Unfortunatly , he did not get much relief on Prazosin.     Kayleanna Lorman, MD

## 2017-07-05 NOTE — Progress Notes (Signed)
PATIENT: Brian Alvarez DOB: 1986/08/18  REASON FOR VISIT: follow up HISTORY FROM: patient  HISTORY OF PRESENT ILLNESS: Mr. Pilley is a 31 year old male with a history of confusional arousals. He returns today for follow-up. He has had sleep study but it did not indicate sleep apnea nor parasomnia. The patient was tried on prazosin but he did not see the benefit. He states that these events seem to happen only when he is in a familiar place. If he sleeps in a hotel he does not have this occur. He states that typically what happens is he is woken up by his wife. He will get up and get ready, eat breakfast however at some point he goes back to bed. And when he awakes he does not remember getting ready or eating breakfast. He states that he has tried to stay awake in the mornings when he initially wakes up however his wife leaves very early and he never recalls going back to bed. The patient reports he is also tried several different alarm clocks but he tends to silence them and go back to sleep without his knowledge. The patient states that he's been trying to endorse a regular sleep routine. He typically goes to bed around midnight and will awaken at 8 AM. 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 He denies any new neurological symptoms. He returns today for an evaluation.   REVIEW OF SYSTEMS: Out of a complete 14 system review of symptoms, the patient complains only of the following symptoms, and all other reviewed systems are negative.  See HPI  ALLERGIES: Allergies  Allergen Reactions  . Penicillins     Hives Has patient had a PCN reaction causing immediate rash, facial/tongue/throat swelling, SOB or lightheadedness with hypotension: yes Has patient had a PCN reaction causing severe rash involving mucus membranes or skin necrosis: yes Has patient had a PCN reaction that required hospitalization: no Has patient had a PCN reaction occurring within the last 10 years: yes If all of the above  answers are "NO", then may proceed with Cephalosporin use.  Marland Kitchen Rosemary Oil     HOME MEDICATIONS: Outpatient Medications Prior to Visit  Medication Sig Dispense Refill  . amphetamine-dextroamphetamine (ADDERALL XR) 10 MG 24 hr capsule Take 1 capsule (10 mg total) by mouth daily. 30 capsule 0  . EPINEPHrine (EPI-PEN) 0.3 mg/0.3 mL DEVI Inject 0.3 mLs (0.3 mg total) into the muscle once. 2 Device 0  . Fluticasone Furoate-Vilanterol 200-25 MCG/INH AEPB Inhale 1 puff into the lungs daily.     . hydrOXYzine (ATARAX/VISTARIL) 25 MG tablet Take 0.5-1 tablets (12.5-25 mg total) by mouth at bedtime as needed for itching. 30 tablet 0  . ibuprofen (ADVIL,MOTRIN) 200 MG tablet Take 600 mg by mouth every 6 (six) hours as needed.     Marland Kitchen levocetirizine (XYZAL) 5 MG tablet Take 5 mg by mouth every evening.    . montelukast (SINGULAIR) 10 MG tablet Take one daily at bedtime 30 tablet 11  . Multiple Vitamins-Minerals (MULTIVITAMIN WITH MINERALS) tablet Take 1 tablet by mouth daily.    . sertraline (ZOLOFT) 50 MG tablet TAKE 1 TABLET BY MOUTH DAILY 90 tablet 0  . traMADol (ULTRAM) 50 MG tablet Take 1 tablet (50 mg total) by mouth every 8 (eight) hours as needed. 25 tablet 1  . VENTOLIN HFA 108 (90 Base) MCG/ACT inhaler INHALE TWO PUFFS BY MOUTH EVERY 6 HOURS AS NEEDED 54 g 0   No facility-administered medications prior to  visit.     PAST MEDICAL HISTORY: Past Medical History:  Diagnosis Date  . Allergy   . Anemia   . Asthma   . Carpal tunnel syndrome   . Carpal tunnel syndrome     PAST SURGICAL HISTORY: History reviewed. No pertinent surgical history.  FAMILY HISTORY: Family History  Problem Relation Age of Onset  . Drug abuse Mother   . Heart failure Mother   . Arthritis Maternal Grandmother   . Heart disease Maternal Grandfather     SOCIAL HISTORY: Social History   Social History  . Marital status: Married    Spouse name: N/A  . Number of children: N/A  . Years of education: N/A    Occupational History  . Not on file.   Social History Main Topics  . Smoking status: Former Games developer  . Smokeless tobacco: Never Used     Comment: has switched to electronic cigarette  . Alcohol use Yes     Comment: occassional  . Drug use: No  . Sexual activity: Yes   Other Topics Concern  . Not on file   Social History Narrative  . No narrative on file      PHYSICAL EXAM  Vitals:   07/05/17 0826  BP: 132/82  Pulse: 76  Weight: (!) 323 lb 3.2 oz (146.6 kg)  Height:  (1.753 m)   Body mass index is 47.73 kg/m.  Generalized: Well developed, in no acute distress   Neurological examination  Mentation: Alert oriented to time, place, history taking. Follows all commands speech and language fluent Cranial nerve II-XII: Pupils were equal round reactive to light. Extraocular movements were full, visual field were full on confrontational test. Facial sensation and strength were normal. Uvula tongue midline. Head turning and shoulder shrug  were normal and symmetric. Motor: The motor testing reveals 5 over 5 strength of all 4 extremities. Good symmetric motor tone is noted throughout.  Sensory: Sensory testing is intact to soft touch on all 4 extremities. No evidence of extinction is noted.  Coordination: Cerebellar testing reveals good finger-nose-finger and heel-to-shin bilaterally.  Gait and station: Gait is normal. DIAGNOSTIC DATA (LABS, IMAGING, TESTING) - I reviewed patient records, labs, notes, testing and imaging myself where available.  Lab Results  Component Value Date   WBC 10.2 10/19/2016   HGB 14.9 10/19/2016   HCT 42.1 (A) 10/19/2016   MCV 82.6 10/19/2016      Component Value Date/Time   NA 133 (L) 10/19/2016 1336   K 3.7 10/19/2016 1336   CL 101 10/19/2016 1336   CO2 23 10/19/2016 1336   GLUCOSE 164 (H) 10/19/2016 1336   BUN 11 10/19/2016 1336   CREATININE 1.03 10/19/2016 1336   CREATININE 0.85 11/28/2015 1305   CALCIUM 8.3 (L) 10/19/2016 1336    PROT 7.1 10/19/2016 1336   ALBUMIN 4.0 10/19/2016 1336   AST 47 (H) 10/19/2016 1336   ALT 44 10/19/2016 1336   ALKPHOS 62 10/19/2016 1336   BILITOT 0.8 10/19/2016 1336   GFRNONAA >60 10/19/2016 1336   GFRAA >60 10/19/2016 1336   Lab Results  Component Value Date   CHOL 146 10/22/2013   HDL 29 (L) 10/22/2013   LDLCALC 91 10/22/2013   TRIG 131 10/22/2013   CHOLHDL 5.0 10/22/2013   Lab Results  Component Value Date   HGBA1C 5.7 07/19/2015   No results found for: [id number] Lab Results  Component Value Date   TSH 0.54 01/08/2016      ASSESSMENT AND PLAN  31 y.o. year old male  has a past medical history of Allergy; Anemia; Asthma; Carpal tunnel syndrome; and Carpal tunnel syndrome. here with:  1. Confusional arousals  Since the patient got little benefit with Prazosin Dr. Vickey Huger has recommended a 30 day trial on auto titration. I will send in order to [facility] today. Patient will follow-up in 3 months to discuss results. Patient is amenable to this plan. He will call with any new or worsening symptoms.  I spent 15 minutes with the patient. 50% of this time was spent discussing treatment plan     Butch Penny, MSN, NP-C 07/05/2017, 8:45 AM [facility] [address] [place], [place] [address] [phone]

## 2017-07-20 ENCOUNTER — Telehealth: Payer: Self-pay | Admitting: Neurology

## 2017-07-20 DIAGNOSIS — G4733 Obstructive sleep apnea (adult) (pediatric): Secondary | ICD-10-CM | POA: Diagnosis not present

## 2017-07-20 NOTE — Telephone Encounter (Signed)
Called the patient to make him aware that his apt that is scheduled for 10/19/2017 is one day too far out for insurance purposes. Pt was set up with CPAP machine today and would need to be seen within any time during these dates 08/20/17-10/18/2017. LVM to Advise the pt to call back and make sure his apt is changed within these dates.

## 2017-08-19 DIAGNOSIS — G4733 Obstructive sleep apnea (adult) (pediatric): Secondary | ICD-10-CM | POA: Diagnosis not present

## 2017-08-30 ENCOUNTER — Other Ambulatory Visit: Payer: Self-pay | Admitting: Physician Assistant

## 2017-09-19 DIAGNOSIS — G4733 Obstructive sleep apnea (adult) (pediatric): Secondary | ICD-10-CM | POA: Diagnosis not present

## 2017-09-20 ENCOUNTER — Other Ambulatory Visit: Payer: Self-pay | Admitting: Physician Assistant

## 2017-09-21 NOTE — Telephone Encounter (Signed)
I don't see where this Zoloft is addressed in any of the office notes.

## 2017-09-22 NOTE — Telephone Encounter (Signed)
I looked through the Pts chart looking for mentions of Zoloft but couldn't find any. I do see past discontinued Zolofts. Pt's last refill was 08/30/17 for 90 day supply.

## 2017-09-25 NOTE — Telephone Encounter (Signed)
Patient notified via My Chart.  Meds ordered this encounter  Medications  . sertraline (ZOLOFT) 50 MG tablet    Sig: TAKE 1 TABLET BY MOUTH DAILY    Dispense:  90 tablet    Refill:  0   Needs to schedule visit with PA Weber.

## 2017-10-09 DIAGNOSIS — J020 Streptococcal pharyngitis: Secondary | ICD-10-CM | POA: Diagnosis not present

## 2017-10-09 DIAGNOSIS — J029 Acute pharyngitis, unspecified: Secondary | ICD-10-CM | POA: Diagnosis not present

## 2017-10-13 ENCOUNTER — Other Ambulatory Visit: Payer: Self-pay

## 2017-10-13 ENCOUNTER — Encounter: Payer: Self-pay | Admitting: Family Medicine

## 2017-10-13 ENCOUNTER — Ambulatory Visit (INDEPENDENT_AMBULATORY_CARE_PROVIDER_SITE_OTHER): Payer: BLUE CROSS/BLUE SHIELD

## 2017-10-13 ENCOUNTER — Ambulatory Visit: Payer: BLUE CROSS/BLUE SHIELD | Admitting: Family Medicine

## 2017-10-13 VITALS — BP 116/70 | HR 96 | Temp 98.5°F | Resp 18 | Ht 69.0 in | Wt 322.0 lb

## 2017-10-13 DIAGNOSIS — J4521 Mild intermittent asthma with (acute) exacerbation: Secondary | ICD-10-CM

## 2017-10-13 DIAGNOSIS — J0101 Acute recurrent maxillary sinusitis: Secondary | ICD-10-CM

## 2017-10-13 DIAGNOSIS — J029 Acute pharyngitis, unspecified: Secondary | ICD-10-CM

## 2017-10-13 DIAGNOSIS — R05 Cough: Secondary | ICD-10-CM | POA: Diagnosis not present

## 2017-10-13 LAB — POCT RAPID STREP A (OFFICE): Rapid Strep A Screen: NEGATIVE

## 2017-10-13 MED ORDER — ALBUTEROL SULFATE (2.5 MG/3ML) 0.083% IN NEBU
2.5000 mg | INHALATION_SOLUTION | Freq: Once | RESPIRATORY_TRACT | Status: AC
  Administered 2017-10-13: 2.5 mg via RESPIRATORY_TRACT

## 2017-10-13 MED ORDER — HYDROCOD POLST-CPM POLST ER 10-8 MG/5ML PO SUER: 5.0000 mL | Freq: Two times a day (BID) | ORAL | 0 refills | Status: DC | PRN

## 2017-10-13 MED ORDER — CEFDINIR 300 MG PO CAPS: 600.0000 mg | ORAL_CAPSULE | Freq: Every day | ORAL | 0 refills | Status: DC

## 2017-10-13 NOTE — Progress Notes (Signed)
Subjective:    Patient ID: Brian Alvarez, male    DOB: Apr 29, 1986, 32 y.o.   MRN: [id number] Chief Complaint  Patient presents with  . Sore Throat    x1 week, Pt states he has fevers that come and go. Pt states he went to [facility] on Saturday and given a Z-pack. Pt states he was tested for strep and it came back NEG. Pt states he started to feel better on Monday but states symptoms came back and are about the same prior to antibotics.    HPI  The day after he started the zpack he got sig improvement but by that evening all sxs have returned. Persistent cough. Has had recurrent low grade temps up to 99._. Productive cough with colored sputum woith h/o pna last year. Lots of PND. Some sinus pain but responds to ibuprofen. Dayquil/nyquil also using. Was rx'd promethazine-DM which dried him out but was not effective in suppressing cough. Sleeping a lot but not well. Taking xyzal and singulair but not needing inhaler - only with environmental exposures. No smoking Sig pharyngitis.  Stomach a little queasy. Did have bad headache for a while with only temporary relief.    Past Medical History:  Diagnosis Date  . Allergy   . Anemia   . Asthma   . Carpal tunnel syndrome   . Carpal tunnel syndrome    History reviewed. No pertinent surgical history. Current Outpatient Medications on File Prior to Visit  Medication Sig Dispense Refill  . amphetamine-dextroamphetamine (ADDERALL XR) 10 MG 24 hr capsule Take 1 capsule (10 mg total) by mouth daily. 30 capsule 0  . EPINEPHrine (EPI-PEN) 0.3 mg/0.3 mL DEVI Inject 0.3 mLs (0.3 mg total) into the muscle once. 2 Device 0  . Fluticasone Furoate-Vilanterol 200-25 MCG/INH AEPB Inhale 1 puff into the lungs daily.     . hydrOXYzine (ATARAX/VISTARIL) 25 MG tablet Take 0.5-1 tablets (12.5-25 mg total) by mouth at bedtime as needed for itching. 30 tablet 0  . ibuprofen (ADVIL,MOTRIN) 200 MG tablet Take 600 mg by mouth every 6 (six) hours as needed.     Marland Kitchen  levocetirizine (XYZAL) 5 MG tablet Take 5 mg by mouth every evening.    . montelukast (SINGULAIR) 10 MG tablet Take one daily at bedtime 30 tablet 11  . Multiple Vitamins-Minerals (MULTIVITAMIN WITH MINERALS) tablet Take 1 tablet by mouth daily.    . promethazine-dextromethorphan (PROMETHAZINE-DM) 6.25-15 MG/5ML syrup   0  . sertraline (ZOLOFT) 50 MG tablet TAKE 1 TABLET BY MOUTH DAILY 90 tablet 0  . traMADol (ULTRAM) 50 MG tablet Take 1 tablet (50 mg total) by mouth every 8 (eight) hours as needed. 25 tablet 1  . VENTOLIN HFA 108 (90 Base) MCG/ACT inhaler INHALE TWO PUFFS BY MOUTH EVERY 6 HOURS AS NEEDED 54 g 0   No current facility-administered medications on file prior to visit.    Allergies  Allergen Reactions  . Penicillins     Hives Has patient had a PCN reaction causing immediate rash, facial/tongue/throat swelling, SOB or lightheadedness with hypotension: yes Has patient had a PCN reaction causing severe rash involving mucus membranes or skin necrosis: yes Has patient had a PCN reaction that required hospitalization: no Has patient had a PCN reaction occurring within the last 10 years: yes If all of the above answers are "NO", then may proceed with Cephalosporin use.  Marland Kitchen Rosemary Oil    Family History  Problem Relation Age of Onset  . Drug abuse Mother   .  Heart failure Mother   . Arthritis Maternal Grandmother   . Heart disease Maternal Grandfather    Social History   Socioeconomic History  . Marital status: Married    Spouse name: None  . Number of children: None  . Years of education: None  . Highest education level: None  Social Needs  . Financial resource strain: None  . Food insecurity - worry: None  . Food insecurity - inability: None  . Transportation needs - medical: None  . Transportation needs - non-medical: None  Occupational History  . None  Tobacco Use  . Smoking status: Former Games developer  . Smokeless tobacco: Never Used  . Tobacco comment: has switched  to electronic cigarette  Substance and Sexual Activity  . Alcohol use: Yes    Comment: occassional  . Drug use: No  . Sexual activity: Yes  Other Topics Concern  . None  Social History Narrative  . None   Depression screen [facility] 2/9 10/13/2017 05/24/2017 03/04/2017 01/15/2017 11/22/2016  Decreased Interest 0 0 0 0 0  Down, Depressed, Hopeless 0 0 0 0 0  PHQ - 2 Score 0 0 0 0 0  Altered sleeping - - - - -  Tired, decreased energy - - - - -  Change in appetite - - - - -  Feeling bad or failure about yourself  - - - - -  Trouble concentrating - - - - -  Moving slowly or fidgety/restless - - - - -  Suicidal thoughts - - - - -  PHQ-9 Score - - - - -  Difficult doing work/chores - - - - -      Review of Systems  Constitutional: Positive for activity change, appetite change, chills, diaphoresis, fatigue and fever. Negative for unexpected weight change.  Respiratory: Positive for cough. Negative for chest tightness, shortness of breath and wheezing.   Cardiovascular: Negative for chest pain and palpitations.  Gastrointestinal: Positive for nausea. Negative for abdominal distention, abdominal pain, constipation, diarrhea and vomiting.  Musculoskeletal: Negative for arthralgias, myalgias, neck pain and neck stiffness.  Allergic/Immunologic: Positive for environmental allergies. Negative for immunocompromised state.  Neurological: Positive for headaches. Negative for dizziness, facial asymmetry and light-headedness.  Hematological: Positive for adenopathy.  Psychiatric/Behavioral: Positive for sleep disturbance. Negative for agitation, behavioral problems, confusion and dysphoric mood. The patient is not nervous/anxious.    See hpi    Objective:   Physical Exam  Constitutional: He is oriented to person, place, and time. He appears well-developed and well-nourished. No distress.  HENT:  Head: Normocephalic and atraumatic.  Right Ear: External ear and ear canal normal. Tympanic membrane is  retracted. A middle ear effusion is present.  Left Ear: External ear and ear canal normal. Tympanic membrane is erythematous and retracted. A middle ear effusion is present.  Nose: Mucosal edema (and erythema) present. Right sinus exhibits maxillary sinus tenderness. Left sinus exhibits maxillary sinus tenderness.  Mouth/Throat: Uvula is midline and mucous membranes are normal. Posterior oropharyngeal edema and posterior oropharyngeal erythema present. No oropharyngeal exudate.  Canals red  Eyes: Conjunctivae are normal. Right eye exhibits no discharge. Left eye exhibits no discharge. No scleral icterus.  Neck: Normal range of motion. Neck supple. No thyromegaly present.  Cardiovascular: Normal rate, regular rhythm, normal heart sounds and intact distal pulses.  Pulmonary/Chest: Effort normal. No respiratory distress. He has wheezes (expiratory, persisted after albuterol neb) in the right upper field.  Lymphadenopathy:       Head (right side): Submandibular adenopathy present.  Head (left side): Submandibular adenopathy present.    He has cervical adenopathy.       Right: No supraclavicular adenopathy present.       Left: No supraclavicular adenopathy present.  Neurological: He is alert and oriented to person, place, and time.  Skin: Skin is warm and dry. He is not diaphoretic. No erythema.  Psychiatric: He has a normal mood and affect. His behavior is normal.  No improvement in RUL wheeze with neb but improved overall lung sounds/volume. Pt denies any symptomatic improvement.    BP 116/70 (BP Location: Left Arm, Patient Position: Sitting, Cuff Size: Large)   Pulse 96   Temp 98.5 F (36.9 C) (Oral)   Resp 18   Ht 5\' 9"  (1.753 m)   Wt (!) 322 lb (146.1 kg)   SpO2 96%   BMI 47.55 kg/m   Results for orders placed or performed in visit on 10/13/17  POCT rapid strep A  Result Value Ref Range   Rapid Strep A Screen Negative Negative   Dg Chest 2 View  Result Date:  10/13/2017 CLINICAL DATA:  Cough for 2 days with wheezing, initial encounter EXAM: CHEST  2 VIEW COMPARISON:  10/19/2016 FINDINGS: Cardiac shadow is within normal limits. The lungs are well aerated bilaterally. Previously seen areas of infiltrative change have resolved in the interval. No sizable effusion is noted. No bony abnormality is seen. IMPRESSION: No active cardiopulmonary disease. Electronically Signed   By: Alcide CleverMark  Lukens M.D.   On: 10/13/2017 15:48     Assessment & Plan:   1. Acute pharyngitis, unspecified etiology   2. Mild intermittent asthma with acute exacerbation - has albuterol inhaler and neb machine with albuterol nebs (left over from pna last yr) at home in case he needs.   3. Acute recurrent maxillary sinusitis - prolonged illness with secondary worsening. Failed zpack. Did well on levaquin prior and has pcn allergy of hives - reviewed risks/benefits of cephalosporin vs fluoroquinolone - will try cefdinir.     Orders Placed This Encounter  Procedures  . Culture, Group A Strep    Order Specific Question:   Source    Answer:   oropharynx  . DG Chest 2 View    Standing Status:   Future    Number of Occurrences:   1    Standing Expiration Date:   10/13/2018    Order Specific Question:   Reason for Exam (SYMPTOM  OR DIAGNOSIS REQUIRED)    Answer:   RUL wheezing, cough x 2 days    Order Specific Question:   Preferred imaging location?    Answer:   External  . POCT rapid strep A    Meds ordered this encounter  Medications  . albuterol (PROVENTIL) (2.5 MG/3ML) 0.083% nebulizer solution 2.5 mg  . cefdinir (OMNICEF) 300 MG capsule    Sig: Take 2 capsules (600 mg total) by mouth daily.    Dispense:  20 capsule    Refill:  0  . chlorpheniramine-HYDROcodone (TUSSIONEX PENNKINETIC ER) 10-8 MG/5ML SUER    Sig: Take 5 mLs by mouth every 12 (twelve) hours as needed.    Dispense:  120 mL    Refill:  0     Norberto SorensonEva Shaw, M.D.  [facility]  [place] [address] [place], [place] [id number] [phone] phone [phone] fax  10/14/17 2:23 PM

## 2017-10-13 NOTE — Patient Instructions (Addendum)
Dg Chest 2 View  Result Date: 10/13/2017 CLINICAL DATA:  Cough for 2 days with wheezing, initial encounter EXAM: CHEST  2 VIEW COMPARISON:  10/19/2016 FINDINGS: Cardiac shadow is within normal limits. The lungs are well aerated bilaterally. Previously seen areas of infiltrative change have resolved in the interval. No sizable effusion is noted. No bony abnormality is seen. IMPRESSION: No active cardiopulmonary disease. Electronically Signed   By: Alcide Clever M.D.   On: 10/13/2017 15:48       IF you received an x-ray today, you will receive an invoice from [facility]. Please contact [facility] at [phone] with questions or concerns regarding your invoice.   IF you received labwork today, you will receive an invoice from [place] [facility] [place]. Please contact [facility] at [phone] with questions or concerns regarding your invoice.   Our billing staff will not be able to assist you with questions regarding bills from these companies.  You will be contacted with the lab results as soon as they are available. The fastest way to get your results is to activate your My Chart account. Instructions are located on the last page of this paperwork. If you have not heard from [place] regarding the results in 2 weeks, please contact this office.     Sinusitis, Adult Sinusitis is soreness and inflammation of your sinuses. Sinuses are hollow spaces in the bones around your face. Your sinuses are located:  Around your eyes.  In the middle of your forehead.  Behind your nose.  In your cheekbones.  Your sinuses and nasal passages are lined with a stringy fluid (mucus). Mucus normally drains out of your sinuses. When your nasal tissues become inflamed or swollen, the mucus can become trapped or blocked so air cannot flow through your sinuses. This allows bacteria, viruses, and funguses to grow, which leads to infection. Sinusitis can develop quickly and last for 7?10 days (acute) or for more than  12 weeks (chronic). Sinusitis often develops after a cold. What are the causes? This condition is caused by anything that creates swelling in the sinuses or stops mucus from draining, including:  Allergies.  Asthma.  Bacterial or viral infection.  Abnormally shaped bones between the nasal passages.  Nasal growths that contain mucus (nasal polyps).  Narrow sinus openings.  Pollutants, such as chemicals or irritants in the air.  A foreign object stuck in the nose.  A fungal infection. This is rare.  What increases the risk? The following factors may make you more likely to develop this condition:  Having allergies or asthma.  Having had a recent cold or respiratory tract infection.  Having structural deformities or blockages in your nose or sinuses.  Having a weak immune system.  Doing a lot of swimming or diving.  Overusing nasal sprays.  Smoking.  What are the signs or symptoms? The main symptoms of this condition are pain and a feeling of pressure around the affected sinuses. Other symptoms include:  Upper toothache.  Earache.  Headache.  Bad breath.  Decreased sense of smell and taste.  A cough that may get worse at night.  Fatigue.  Fever.  Thick drainage from your nose. The drainage is often green and it may contain pus (purulent).  Stuffy nose or congestion.  Postnasal drip. This is when extra mucus collects in the throat or back of the nose.  Swelling and warmth over the affected sinuses.  Sore throat.  Sensitivity to light.  How is this diagnosed? This condition is diagnosed based on  symptoms, a medical history, and a physical exam. To find out if your condition is acute or chronic, your health care provider may:  Look in your nose for signs of nasal polyps.  Tap over the affected sinus to check for signs of infection.  View the inside of your sinuses using an imaging device that has a light attached (endoscope).  If your health  care provider suspects that you have chronic sinusitis, you may also:  Be tested for allergies.  Have a sample of mucus taken from your nose (nasal culture) and checked for bacteria.  Have a mucus sample examined to see if your sinusitis is related to an allergy.  If your sinusitis does not respond to treatment and it lasts longer than 8 weeks, you may have an MRI or CT scan to check your sinuses. These scans also help to determine how severe your infection is. In rare cases, a bone biopsy may be done to rule out more serious types of fungal sinus disease. How is this treated? Treatment for sinusitis depends on the cause and whether your condition is chronic or acute. If a virus is causing your sinusitis, your symptoms will go away on their own within 10 days. You may be given medicines to relieve your symptoms, including:  Topical nasal decongestants. They shrink swollen nasal passages and let mucus drain from your sinuses.  Antihistamines. These drugs block inflammation that is triggered by allergies. This can help to ease swelling in your nose and sinuses.  Topical nasal corticosteroids. These are nasal sprays that ease inflammation and swelling in your nose and sinuses.  Nasal saline washes. These rinses can help to get rid of thick mucus in your nose.  If your condition is caused by bacteria, you will be given an antibiotic medicine. If your condition is caused by a fungus, you will be given an antifungal medicine. Surgery may be needed to correct underlying conditions, such as narrow nasal passages. Surgery may also be needed to remove polyps. Follow these instructions at home: Medicines  Take, use, or apply over-the-counter and prescription medicines only as told by your health care provider. These may include nasal sprays.  If you were prescribed an antibiotic medicine, take it as told by your health care provider. Do not stop taking the antibiotic even if you start to feel  better. Hydrate and Humidify  Drink enough water to keep your urine clear or pale yellow. Staying hydrated will help to thin your mucus.  Use a cool mist humidifier to keep the humidity level in your home above 50%.  Inhale steam for 10-15 minutes, 3-4 times a day or as told by your health care provider. You can do this in the bathroom while a hot shower is running.  Limit your exposure to cool or dry air. Rest  Rest as much as possible.  Sleep with your head raised (elevated).  Make sure to get enough sleep each night. General instructions  Apply a warm, moist washcloth to your face 3-4 times a day or as told by your health care provider. This will help with discomfort.  Wash your hands often with soap and water to reduce your exposure to viruses and other germs. If soap and water are not available, use hand sanitizer.  Do not smoke. Avoid being around people who are smoking (secondhand smoke).  Keep all follow-up visits as told by your health care provider. This is important. Contact a health care provider if:  You have a fever.  Your symptoms get worse.  Your symptoms do not improve within 10 days. Get help right away if:  You have a severe headache.  You have persistent vomiting.  You have pain or swelling around your face or eyes.  You have vision problems.  You develop confusion.  Your neck is stiff.  You have trouble breathing. This information is not intended to replace advice given to you by your health care provider. Make sure you discuss any questions you have with your health care provider. Document Released: 09/06/2005 Document Revised: 05/02/2016 Document Reviewed: 07/02/2015 Elsevier Interactive Patient Education  2018 [employer].  Asthma, Acute Bronchospasm Acute bronchospasm caused by asthma is also referred to as an asthma attack. Bronchospasm means your air passages become narrowed. The narrowing is caused by inflammation and tightening of  the muscles in the air tubes (bronchi) in your lungs. This can make it hard to breathe or cause you to wheeze and cough. What are the causes? Possible triggers are:  Animal dander from the skin, hair, or feathers of animals.  Dust mites contained in house dust.  Cockroaches.  Pollen from trees or grass.  Mold.  Cigarette or tobacco smoke.  Air pollutants such as dust, household cleaners, hair sprays, aerosol sprays, paint fumes, strong chemicals, or strong odors.  Cold air or weather changes. Cold air may trigger inflammation. Winds increase molds and pollens in the air.  Strong emotions such as crying or laughing hard.  Stress.  Certain medicines such as aspirin or beta-blockers.  Sulfites in foods and drinks, such as dried fruits and wine.  Infections or inflammatory conditions, such as a flu, cold, or inflammation of the nasal membranes (rhinitis).  Gastroesophageal reflux disease (GERD). GERD is a condition where stomach acid backs up into your esophagus.  Exercise or strenuous activity.  What are the signs or symptoms?  Wheezing.  Excessive coughing, particularly at night.  Chest tightness.  Shortness of breath. How is this diagnosed? Your health care provider will ask you about your medical history and perform a physical exam. A chest X-ray or blood testing may be performed to look for other causes of your symptoms or other conditions that may have triggered your asthma attack. How is this treated? Treatment is aimed at reducing inflammation and opening up the airways in your lungs. Most asthma attacks are treated with inhaled medicines. These include quick relief or rescue medicines (such as bronchodilators) and controller medicines (such as inhaled corticosteroids). These medicines are sometimes given through an inhaler or a nebulizer. Systemic steroid medicine taken by mouth or given through an IV tube also can be used to reduce the inflammation when an attack  is moderate or severe. Antibiotic medicines are only used if a bacterial infection is present. Follow these instructions at home:  Rest.  Drink plenty of liquids. This helps the mucus to remain thin and be easily coughed up. Only use caffeine in moderation and do not use alcohol until you have recovered from your illness.  Do not smoke. Avoid being exposed to secondhand smoke.  You play a critical role in keeping yourself in good health. Avoid exposure to things that cause you to wheeze or to have breathing problems.  Keep your medicines up-to-date and available. Carefully follow your health care provider's treatment plan.  Take your medicine exactly as prescribed.  When pollen or pollution is bad, keep windows closed and use an air conditioner or go to places with air conditioning.  Asthma requires careful medical care.  See your health care provider for a follow-up as advised. If you are more than [redacted] weeks pregnant and you were prescribed any new medicines, let your obstetrician know about the visit and how you are doing. Follow up with your health care provider as directed.  After you have recovered from your asthma attack, make an appointment with your outpatient doctor to talk about ways to reduce the likelihood of future attacks. If you do not have a doctor who manages your asthma, make an appointment with a primary care doctor to discuss your asthma. Get help right away if:  You are getting worse.  You have trouble breathing. If severe, call your local emergency services (911 in the [place]  You develop chest pain or discomfort.  You are vomiting.  You are not able to keep fluids down.  You are coughing up yellow, green, brown, or bloody sputum.  You have a fever and your symptoms suddenly get worse.  You have trouble swallowing. This information is not intended to replace advice given to you by your health care provider. Make sure you discuss any questions you have with  your health care provider. Document Released: 12/22/2006 Document Revised: 02/18/2016 Document Reviewed: 03/14/2013 Elsevier Interactive Patient Education  2017 [employer]

## 2017-10-15 LAB — CULTURE, GROUP A STREP

## 2017-10-19 ENCOUNTER — Ambulatory Visit: Payer: BLUE CROSS/BLUE SHIELD | Admitting: Neurology

## 2017-10-19 ENCOUNTER — Encounter: Payer: Self-pay | Admitting: Neurology

## 2017-10-19 VITALS — BP 135/87 | HR 83 | Ht 69.0 in | Wt 323.0 lb

## 2017-10-19 DIAGNOSIS — J4520 Mild intermittent asthma, uncomplicated: Secondary | ICD-10-CM | POA: Diagnosis not present

## 2017-10-19 DIAGNOSIS — G4750 Parasomnia, unspecified: Secondary | ICD-10-CM

## 2017-10-19 DIAGNOSIS — Z6841 Body Mass Index (BMI) 40.0 and over, adult: Secondary | ICD-10-CM

## 2017-10-19 DIAGNOSIS — R0683 Snoring: Secondary | ICD-10-CM | POA: Diagnosis not present

## 2017-10-19 DIAGNOSIS — J45909 Unspecified asthma, uncomplicated: Secondary | ICD-10-CM | POA: Insufficient documentation

## 2017-10-19 NOTE — Progress Notes (Signed)
PATIENT: Brian Alvarez DOB: 1986-01-25  REASON FOR VISIT: follow up HISTORY FROM: patient  HISTORY OF PRESENT ILLNESS:  Interval history from 19 October 2017.  I have the pleasure of following up with [name] today, who was last seen by my nurse practitioner Everlene OtherMegan Milliken in 2018.  He was evaluated for parasomnia activity, did not tolerate prazosin well-it did just not help very much.  As an alternative we thought about reducing his apneic events and may be allowing him to get deeper more restorative stages of sleep with less parasomnia activity.  This seems not to have worked out either.  What was meant to be a 30-day trial on an auto titration CPAP became difficult for the patient to tolerate, he took the mask off in the middle of the night and he never felt more restored or more refreshed than before CPAP use.  The last 90 days did show an only 18% compliance on AutoSet between 5 and 15 cm water with 3 cm EPR and the residual AHI was still 5.5.  95th percentile pressure was 14.8.  Based on this I would like for the patient to discontinue CPAP, given that his baseline apnea index was only mild in a home sleep test from 19 Jan 2017 and confirmed as mild in an attended sleep study from 10 Feb 2017.  REM AHI was 7.9/h.   We never captured him to sleep walk during the attended sleep study.  My goal for the patient now is to concentrate on a medical weight loss management, he may also qualify for bariatric surgery if he wants to consider this in the future.  Mr. Brian Alvarez is a 32 year old male with a history of confusional arousals. He returns today for follow-up. He has had sleep study but it did not indicate sleep apnea nor parasomnia. The patient was tried on prazosin but he did not see the benefit. He states that these events seem to happen only when he is in a familiar place. If he sleeps in a hotel he does not have this occur. He states that typically what happens is he is woken up by his wife.  He will get up and get ready, eat breakfast however at some point he goes back to bed. And when he awakes he does not remember getting ready or eating breakfast. He states that he has tried to stay awake in the mornings when he initially wakes up however his wife leaves very early and he never recalls going back to bed. The patient reports he is also tried several different alarm clocks but he tends to silence them and go back to sleep without his knowledge. The patient states that he's been trying to endorse a regular sleep routine. He typically goes to bed around midnight and will awaken at 8 AM. 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 He denies any new neurological symptoms. He returns today for an evaluation.   REVIEW OF SYSTEMS: Out of a complete 14 system review of symptoms, the patient complains only of the following symptoms, and all other reviewed systems are negative.  Morbid obesity, sleep walking, mild apnea- not CPAP tolerant and apnea was not significantly reduced under CPAP.   See HPI  ALLERGIES: Allergies  Allergen Reactions  . Penicillins     Hives Has patient had a PCN reaction causing immediate rash, facial/tongue/throat swelling, SOB or lightheadedness with hypotension: yes Has patient had a PCN reaction causing severe rash involving mucus  membranes or skin necrosis: yes Has patient had a PCN reaction that required hospitalization: no Has patient had a PCN reaction occurring within the last 10 years: yes If all of the above answers are "NO", then may proceed with Cephalosporin use.  Marland Kitchen Rosemary Oil     HOME MEDICATIONS: Outpatient Medications Prior to Visit  Medication Sig Dispense Refill  . amphetamine-dextroamphetamine (ADDERALL XR) 10 MG 24 hr capsule Take 1 capsule (10 mg total) by mouth daily. 30 capsule 0  . cefdinir (OMNICEF) 300 MG capsule Take 2 capsules (600 mg total) by mouth daily. 20 capsule 0  . chlorpheniramine-HYDROcodone (TUSSIONEX PENNKINETIC ER) 10-8  MG/5ML SUER Take 5 mLs by mouth every 12 (twelve) hours as needed. 120 mL 0  . EPINEPHrine (EPI-PEN) 0.3 mg/0.3 mL DEVI Inject 0.3 mLs (0.3 mg total) into the muscle once. 2 Device 0  . Fluticasone Furoate-Vilanterol 200-25 MCG/INH AEPB Inhale 1 puff into the lungs daily.     . hydrOXYzine (ATARAX/VISTARIL) 25 MG tablet Take 0.5-1 tablets (12.5-25 mg total) by mouth at bedtime as needed for itching. 30 tablet 0  . ibuprofen (ADVIL,MOTRIN) 200 MG tablet Take 600 mg by mouth every 6 (six) hours as needed.     Marland Kitchen levocetirizine (XYZAL) 5 MG tablet Take 5 mg by mouth every evening.    . montelukast (SINGULAIR) 10 MG tablet Take one daily at bedtime 30 tablet 11  . Multiple Vitamins-Minerals (MULTIVITAMIN WITH MINERALS) tablet Take 1 tablet by mouth daily.    . sertraline (ZOLOFT) 50 MG tablet TAKE 1 TABLET BY MOUTH DAILY 90 tablet 0  . VENTOLIN HFA 108 (90 Base) MCG/ACT inhaler INHALE TWO PUFFS BY MOUTH EVERY 6 HOURS AS NEEDED 54 g 0  . promethazine-dextromethorphan (PROMETHAZINE-DM) 6.25-15 MG/5ML syrup   0  . traMADol (ULTRAM) 50 MG tablet Take 1 tablet (50 mg total) by mouth every 8 (eight) hours as needed. 25 tablet 1   No facility-administered medications prior to visit.     PAST MEDICAL HISTORY: Past Medical History:  Diagnosis Date  . Allergy   . Anemia   . Asthma   . Carpal tunnel syndrome   . Carpal tunnel syndrome     PAST SURGICAL HISTORY: No past surgical history on file.  FAMILY HISTORY: Family History  Problem Relation Age of Onset  . Drug abuse Mother   . Heart failure Mother   . Arthritis Maternal Grandmother   . Heart disease Maternal Grandfather     SOCIAL HISTORY: Social History   Socioeconomic History  . Marital status: Married    Spouse name: Not on file  . Number of children: Not on file  . Years of education: Not on file  . Highest education level: Not on file  Social Needs  . Financial resource strain: Not on file  . Food insecurity - worry: Not on  file  . Food insecurity - inability: Not on file  . Transportation needs - medical: Not on file  . Transportation needs - non-medical: Not on file  Occupational History  . Not on file  Tobacco Use  . Smoking status: Former Games developer  . Smokeless tobacco: Never Used  . Tobacco comment: has switched to electronic cigarette  Substance and Sexual Activity  . Alcohol use: Yes    Comment: occassional  . Drug use: No  . Sexual activity: Yes  Other Topics Concern  . Not on file  Social History Narrative  . Not on file      PHYSICAL EXAM  Vitals:   10/19/17 1042  BP: 135/87  Pulse: 83  Weight: (!) 323 lb (146.5 kg)  Height: 5\' 9"  (1.753 m)   Body mass index is 47.7 kg/m.  Generalized: Well developed, in no acute distress   Neurological examination  Mentation: Alert oriented to time, place, history taking.  Follows all commands speech and language fluent Cranial nerve : NO change in taste or smell sensation and sensitivity. Pupils were equal round reactive to light. Facial sensation and strength were normal. Uvula tongue midline. Head turning and shoulder shrug  were normal and symmetric. Motor: normal strength of all 4 extremities. Good symmetric motor tone is noted throughout.  Sensory: intact to primary modalities- 4 extremities.    DIAGNOSTIC DATA (LABS, IMAGING, TESTING) - I reviewed patient records, labs, notes, testing and imaging myself where available.  reviewd CPAP download auto-titration - negative impact , no reduction in AHI, will discontinue CPAP   Parasomnia  Frequency 1 / week or less- 2-3 a month. Unaware of triggers.    Lab Results  Component Value Date   WBC 10.2 10/19/2016   HGB 14.9 10/19/2016   HCT 42.1 (A) 10/19/2016   MCV 82.6 10/19/2016      Component Value Date/Time   NA 133 (L) 10/19/2016 1336   K 3.7 10/19/2016 1336   CL 101 10/19/2016 1336   CO2 23 10/19/2016 1336   GLUCOSE 164 (H) 10/19/2016 1336   BUN 11 10/19/2016 1336    CREATININE 1.03 10/19/2016 1336   CREATININE 0.85 11/28/2015 1305   CALCIUM 8.3 (L) 10/19/2016 1336   PROT 7.1 10/19/2016 1336   ALBUMIN 4.0 10/19/2016 1336   AST 47 (H) 10/19/2016 1336   ALT 44 10/19/2016 1336   ALKPHOS 62 10/19/2016 1336   BILITOT 0.8 10/19/2016 1336   GFRNONAA >60 10/19/2016 1336   GFRAA >60 10/19/2016 1336   Lab Results  Component Value Date   CHOL 146 10/22/2013   HDL 29 (L) 10/22/2013   LDLCALC 91 10/22/2013   TRIG 131 10/22/2013   CHOLHDL 5.0 10/22/2013   Lab Results  Component Value Date   HGBA1C 5.7 07/19/2015   No results found for: [id number] Lab Results  Component Value Date   TSH 0.54 01/08/2016      ASSESSMENT AND PLAN 32 y.o. year old male  has a past medical history of Allergy, Anemia, Asthma, Carpal tunnel syndrome, and Carpal tunnel syndrome. here with:  1. Confusional arousals/ parasomnia.  Melatonin without effect. The patient got little benefit with Prazosin, and no effect from mild OSA treatment with CPAP . The patient reports sleep deprivation as a possible trigger. I offered prn medication- imipramine- but he feels it's getting better and he would like to hold off.  He may see me or NP prn.   2. Mild sleep apnea, not improved under CPAP- will d/c CPAP use with medical permission. Wrote e mail to DME.    3. Weight loss - needs to see a medical weight management specialist, has failed diets, has been a comfort eater- works in a non physical capacity/ sedate position. Stress at work but no physical outlet.  I referred to Dr Dalbert Garnet .    Melvyn Novas, MD  10/19/2017, 11:03 AM [facility] [address], [address] [place], [place] [address] [phone]

## 2017-12-03 ENCOUNTER — Encounter: Payer: Self-pay | Admitting: Family Medicine

## 2017-12-03 ENCOUNTER — Other Ambulatory Visit: Payer: Self-pay

## 2017-12-03 ENCOUNTER — Ambulatory Visit: Payer: BLUE CROSS/BLUE SHIELD | Admitting: Family Medicine

## 2017-12-03 VITALS — BP 138/84 | HR 88 | Temp 98.6°F | Resp 20 | Ht 69.0 in | Wt 321.2 lb

## 2017-12-03 DIAGNOSIS — F39 Unspecified mood [affective] disorder: Secondary | ICD-10-CM

## 2017-12-03 DIAGNOSIS — J4520 Mild intermittent asthma, uncomplicated: Secondary | ICD-10-CM | POA: Diagnosis not present

## 2017-12-03 DIAGNOSIS — F909 Attention-deficit hyperactivity disorder, unspecified type: Secondary | ICD-10-CM

## 2017-12-03 MED ORDER — ALBUTEROL SULFATE HFA 108 (90 BASE) MCG/ACT IN AERS: 2.0000 | INHALATION_SPRAY | RESPIRATORY_TRACT | 3 refills | Status: DC | PRN

## 2017-12-03 MED ORDER — AMPHETAMINE-DEXTROAMPHETAMINE 10 MG PO TABS: 10.0000 mg | ORAL_TABLET | Freq: Two times a day (BID) | ORAL | 0 refills | Status: DC

## 2017-12-03 NOTE — Progress Notes (Signed)
Subjective:    Patient ID: Brian Alvarez, male    DOB: Sep 11, 1986, 32 y.o.   MRN: [id number] Chief Complaint  Patient presents with  . Medication Management    Pt states he wants to discuss coming off Zoloft because states he doesn't think he needs it anymore.  . Medication Refill    Adderall XR 10 MG and Albuterol Inhaler    HPI Brian Alvarez is a 32 yo male who is here to discuss his ADHD. I have only met him once recently for an acute sinusitis but he is seen by my partner for management of his primary medical problems including his ADHD.  At his last visit 6 mos ago, he was changed from IR adderall 10 which he was taking sporadially in 1/2 - 1 qd-bid to adderall XR 1m which was then titrated up to 18mwhen last filled 5 mos prior.  In the past year, he has filled #60 adderall 101mn 11/23/16, #30 adderall XR 5mg16m4/18, #30 adderall XR 10mg37m1/18.  He thinks he likes the IR better as he is a "slow waker" - takes him several hours or more to get him started in the morning so the IR helped better with that.  Works 11-8 so then stays up to 1 but with the XR.  Uses one in the morning and then occasionally    He has also seen by neurology for confusional arousals - found to have mild OSA which did not improve on cpap so it was d/c'd. He had failed prazosin and melatonin but felt the episodes were getting better so declined trial of imipramine.   6 weeks - 4/27 -  Baby is due.  Asthma is doing well - out of his albuterol inhaler for almost a month.  Still has 3 refills left of the Breo - uses every night.  Stays on xyzal and singulair regularly.  Does use hydroxyzine 1-2x/d prn for more immediately allergy relief.  Has been on zoloft for almost 3 years.  Started 6-8 mos prior to current job.  He has missed it 3-4x in the past 6 mos for long enough that he begins to have w/d sxs. Did help his depression but never helped his anxiety.  Was seeing therapist for a while and founds that daily meditation  and trying to inspect why he was anxious. Still has "a physiologic fear reaction" occ but not enough to be very problematic or affecting his life.  Past Medical History:  Diagnosis Date  . Allergy   . Anemia   . Asthma   . Carpal tunnel syndrome   . Carpal tunnel syndrome    History reviewed. No pertinent surgical history. Current Outpatient Medications on File Prior to Visit  Medication Sig Dispense Refill  . EPINEPHrine (EPI-PEN) 0.3 mg/0.3 mL DEVI Inject 0.3 mLs (0.3 mg total) into the muscle once. 2 Device 0  . Fluticasone Furoate-Vilanterol 200-25 MCG/INH AEPB Inhale 1 puff into the lungs daily.     . hydrOXYzine (ATARAX/VISTARIL) 25 MG tablet Take 0.5-1 tablets (12.5-25 mg total) by mouth at bedtime as needed for itching. (Patient taking differently: Take 25 mg by mouth at bedtime. ) 30 tablet 0  . ibuprofen (ADVIL,MOTRIN) 200 MG tablet Take 600 mg by mouth every 6 (six) hours as needed for headache or moderate pain.     . levMarland Kitchencetirizine (XYZAL) 5 MG tablet Take 5 mg by mouth every evening.    . montelukast (SINGULAIR) 10 MG tablet Take one daily  at bedtime (Patient taking differently: Take 10 mg by mouth at bedtime. ) 30 tablet 11  . Multiple Vitamins-Minerals (MULTIVITAMIN WITH MINERALS) tablet Take 1 tablet by mouth daily.     No current facility-administered medications on file prior to visit.    Allergies  Allergen Reactions  . Penicillins     Hives Has patient had a PCN reaction causing immediate rash, facial/tongue/throat swelling, SOB or lightheadedness with hypotension: yes Has patient had a PCN reaction causing severe rash involving mucus membranes or skin necrosis: yes Has patient had a PCN reaction that required hospitalization: no Has patient had a PCN reaction occurring within the last 10 years: yes If all of the above answers are "NO", then may proceed with Cephalosporin use.  Marland Kitchen Rosemary Oil    Family History  Problem Relation Age of Onset  . Drug abuse Mother    . Heart failure Mother   . Arthritis Maternal Grandmother   . Heart disease Maternal Grandfather    Social History   Socioeconomic History  . Marital status: Married    Spouse name: Not on file  . Number of children: Not on file  . Years of education: Not on file  . Highest education level: Not on file  Occupational History  . Not on file  Social Needs  . Financial resource strain: Not on file  . Food insecurity:    Worry: Not on file    Inability: Not on file  . Transportation needs:    Medical: Not on file    Non-medical: Not on file  Tobacco Use  . Smoking status: Former Research scientist (life sciences)  . Smokeless tobacco: Never Used  . Tobacco comment: has switched to electronic cigarette  Substance and Sexual Activity  . Alcohol use: Yes    Comment: occassional  . Drug use: No  . Sexual activity: Yes  Lifestyle  . Physical activity:    Days per week: Not on file    Minutes per session: Not on file  . Stress: Not on file  Relationships  . Social connections:    Talks on phone: Not on file    Gets together: Not on file    Attends religious service: Not on file    Active member of club or organization: Not on file    Attends meetings of clubs or organizations: Not on file    Relationship status: Not on file  Other Topics Concern  . Not on file  Social History Narrative  . Not on file   Depression screen [facility] 2/9 01/17/2018 12/28/2017 12/03/2017 10/13/2017 05/24/2017  Decreased Interest 0 0 0 0 0  Down, Depressed, Hopeless 0 0 0 0 0  PHQ - 2 Score 0 0 0 0 0  Altered sleeping - - - - -  Tired, decreased energy - - - - -  Change in appetite - - - - -  Feeling bad or failure about yourself  - - - - -  Trouble concentrating - - - - -  Moving slowly or fidgety/restless - - - - -  Suicidal thoughts - - - - -  PHQ-9 Score - - - - -  Difficult doing work/chores - - - - -      Review of Systems See hpi    Objective:   Physical Exam  Constitutional: He is oriented to person, place,  and time. He appears well-developed and well-nourished. No distress.  HENT:  Head: Normocephalic and atraumatic.  Eyes: Pupils are equal, round, and  reactive to light. Conjunctivae are normal. No scleral icterus.  Neck: Normal range of motion. Neck supple. No thyromegaly present.  Cardiovascular: Normal rate, regular rhythm, normal heart sounds and intact distal pulses.  Pulmonary/Chest: Effort normal and breath sounds normal. No respiratory distress.  Musculoskeletal: He exhibits no edema.  Lymphadenopathy:    He has no cervical adenopathy.  Neurological: He is alert and oriented to person, place, and time.  Skin: Skin is warm and dry. He is not diaphoretic.  Psychiatric: He has a normal mood and affect. His behavior is normal.   BP 138/84 (BP Location: Left Arm, Patient Position: Sitting, Cuff Size: Large)   Pulse 88   Temp 98.6 F (37 C) (Oral)   Resp 20   Ht _0  (1.753 m)   Wt (!) 321 lb 3.2 oz (145.7 kg)   SpO2 96%   BMI 47.43 kg/m      Assessment & Plan:   1. Attention deficit hyperactivity disorder (ADHD), unspecified ADHD type - rxs at pharm to fill 3/16, 4/15, 5/16.  F/u OV for any additional refills  2. Episodic mood disorder ([place]) - things going very well so wants to wean off zoloft - cut dose in half every 2 weeks and stop it in 4 wks.  3. Mild intermittent asthma without complication - refilled albuterol     Meds ordered this encounter  Medications  . albuterol (VENTOLIN HFA) 108 (90 Base) MCG/ACT inhaler    Sig: Inhale 2 puffs into the lungs every 4 (four) hours as needed for wheezing or shortness of breath.    Dispense:  54 g    Refill:  3  . amphetamine-dextroamphetamine (ADDERALL) 10 MG tablet    Sig: Take 1 tablet (10 mg total) by mouth 2 (two) times daily.    Dispense:  60 tablet    Refill:  0  . amphetamine-dextroamphetamine (ADDERALL) 10 MG tablet    Sig: Take 1 tablet (10 mg total) by mouth 2 (two) times daily.    Dispense:  60 tablet    Refill:   0  . amphetamine-dextroamphetamine (ADDERALL) 10 MG tablet    Sig: Take 1 tablet (10 mg total) by mouth 2 (two) times daily.    Dispense:  60 tablet    Refill:  0    I personally performed the services described in this documentation, which was scribed in my presence. The recorded information has been reviewed and considered, and addended by me as needed.   Delman Cheadle, M.D.  Primary Care at [facility] [address] [place], [place] [address] [phone] phone [phone] fax  04/09/18 10:16 PM

## 2017-12-03 NOTE — Patient Instructions (Addendum)
You have rxs at the pharmacy of the adderall to fill on or after: today 4/15 5/15  Decrease your setraline to 1/2 tab daily x 2 weeks, then 1/2 tab every other day x 2 weeks, then stop.   IF you received an x-ray today, you will [facility]. Please contact [facility] at [phone] with questions or concerns regarding your invoice.   IF you received labwork today, you will receive an invoice from [facility]. Please contact [facility] at [phone] with questions or concerns regarding your invoice.   Our billing staff will not be able to assist you with questions regarding bills from these companies.  You will be contacted with the lab results as soon as they are available. The fastest way to get your results is to activate your My Chart account. Instructions are located on the last page of this paperwork. If you have not heard from us regarding the results in 2 weeks, please contact this office.     Adjustment Disorder, Adult Adjustment disorder is a group of symptoms that can develop after a stressful life event, such as the loss of a job or serious physical illness. The symptoms can affect how you feel, think, and act. They may interfere with your relationships. Adjustment disorder increases your risk of suicide and substance abuse. If this disorder is not managed early, it can develop into a more serious condition, such as major depressive disorder or post-traumatic stress disorder. What are the causes? This condition happens when you have trouble recovering from or coping with a stressful life event. What increases the risk? You are more likely to develop this condition if:  You have had depression or anxiety.  You are being treated for a long-term (chronic) illness.  You are being treated for an illness that cannot be cured (terminal illness).  You have a family history of mental illness.  What are the signs or symptoms? Symptoms of  this condition include:  Extreme trouble doing daily tasks, such as going to work.  Sadness, depression, or crying spells.  Worrying a lot.  Loss of enjoyment.  Change in appetite or weight.  Feelings of loss or hopelessness.  Thoughts of suicide.  Anxiety, worry, or nervousness.  Trouble sleeping.  Avoiding family and friends.  Fighting or vandalism.  Complaining of feeling sick without being ill.  Feeling dazed or disconnected.  Nightmares.  Trouble sleeping.  Irritability.  Reckless driving.  Poor work International aid/development workerperformance.  Ignoring bills.  Symptoms of this condition start within three months of the stressful event. They do not last more than six months, unless the stressful circumstances last longer. Normal grieving after the death of a loved one is not a symptom of this condition. How is this diagnosed? To diagnose this condition, your health care provider will ask about what has happened in your life and how it has affected you. He or she may also ask about your medical history and your use of medicines, alcohol, and other substances. Your health care provider may do a physical exam and order lab tests or other studies. You may be referred to a mental health specialist. How is this treated? Treatment options for this condition include:  Counseling or talk therapy. Talk therapy is usually provided by mental health specialists.  Medicines. Certain medicines may help with depression, anxiety, and sleep.  Support groups. These offer emotional support, advice, and guidance. They are made up of people who have had similar experiences.  Observation and time.  This is sometimes called "watchful waiting." In this treatment, health care providers monitor your health and behavior without other treatment. Adjustment disorder sometimes gets better on its own with time.  Follow these instructions at home:  Take over-the-counter and prescription medicines only as told by your  health care provider.  Keep all follow-up visits as told by your health care provider. This is important. Contact a health care provider if:  Your symptoms do not improve in six months.  Your symptoms get worse. Get help right away if:  You have serious thoughts about hurting yourself or someone else. If you ever feel like you may hurt yourself or others, or have thoughts about taking your own life, get help right away. You can go to your nearest emergency department or call:  Your local emergency services (911 in the [place]  A suicide crisis helpline, such as the National Suicide Prevention Lifeline at (586) 586-3701. This is open 24 hours a day.  Summary  Adjustment disorder is a group of symptoms that can develop after a stressful life event, such as the loss of a job or serious physical illness. The symptoms can affect how you feel, think, and act. They may interfere with your relationships.  Symptoms of this condition start within three months of the stressful event. They do not last more than six months, unless the stressful circumstances last longer.  Treatment may include talk therapy, medicines, participation in a support group, or observation to see if symptoms improve.  Contact your health care provider if your symptoms get worse or do not improve in six months.  If you ever feel like you may hurt yourself or others, or have thoughts about taking your own life, get help right away. This information is not intended to replace advice given to you by your health care provider. Make sure you discuss any questions you have with your health care provider. Document Released: 05/11/2006 Document Revised: 11/05/2016 Document Reviewed: 11/05/2016 Elsevier Interactive Patient Education  [employer].

## 2017-12-18 ENCOUNTER — Encounter: Payer: Self-pay | Admitting: Family Medicine

## 2017-12-28 ENCOUNTER — Other Ambulatory Visit: Payer: Self-pay

## 2017-12-28 ENCOUNTER — Encounter: Payer: Self-pay | Admitting: Family Medicine

## 2017-12-28 ENCOUNTER — Ambulatory Visit: Payer: BLUE CROSS/BLUE SHIELD | Admitting: Family Medicine

## 2017-12-28 VITALS — BP 147/99 | HR 88 | Temp 98.9°F | Resp 17 | Ht 69.0 in | Wt 316.8 lb

## 2017-12-28 DIAGNOSIS — L309 Dermatitis, unspecified: Secondary | ICD-10-CM

## 2017-12-28 DIAGNOSIS — F900 Attention-deficit hyperactivity disorder, predominantly inattentive type: Secondary | ICD-10-CM | POA: Diagnosis not present

## 2017-12-28 DIAGNOSIS — F419 Anxiety disorder, unspecified: Secondary | ICD-10-CM

## 2017-12-28 DIAGNOSIS — F329 Major depressive disorder, single episode, unspecified: Secondary | ICD-10-CM | POA: Diagnosis not present

## 2017-12-28 DIAGNOSIS — F32A Depression, unspecified: Secondary | ICD-10-CM

## 2017-12-28 DIAGNOSIS — T43205A Adverse effect of unspecified antidepressants, initial encounter: Secondary | ICD-10-CM | POA: Diagnosis not present

## 2017-12-28 MED ORDER — TRIAMCINOLONE ACETONIDE 0.5 % EX OINT: 1.0000 "application " | TOPICAL_OINTMENT | Freq: Two times a day (BID) | CUTANEOUS | 0 refills | Status: DC

## 2017-12-28 NOTE — Patient Instructions (Addendum)
1. Check your blood pressure regularly call for blood pressures greater than 160/100 sustained 2. Continue to abstain from the medication Sertraline 3.  Monitor symptoms 4.  Follow up in 2 weeks if symptoms or unimproving  IF you received an x-ray today, you will receive an invoice from [facility]. Please contact [facility] at [phone] with questions or concerns regarding your invoice.   IF you received labwork today, you will receive an invoice from [facility]. Please contact [facility] at [phone] with questions or concerns regarding your invoice.   Our billing staff will not be able to assist you with questions regarding bills from these companies.  You will be contacted with the lab results as soon as they are available. The fastest way to get your results is to activate your My Chart account. Instructions are located on the last page of this paperwork. If you have not heard from [place] regarding the results in 2 weeks, please contact this office.    Serotonin Syndrome Serotonin is a brain chemical that regulates the nervous system, which includes the brain, spinal cord, and nerves. Serotonin appears to play a role in all types of behavior, including appetite, emotions, movement, thinking, and response to stress. Excessively high levels of serotonin in the body can cause serotonin syndrome, which is a very dangerous condition. What are the causes? This condition can be caused by taking medicines or drugs that increase the level of serotonin in your body. These include:  Antidepressant medicines.  Migraine medicines.  Certain pain medicines.  Certain recreational drugs, including ecstasy, LSD, cocaine, and amphetamines.  Over-the-counter cough or cold medicines that contain dextromethorphan.  Certain herbal supplements, including St. John's wort, ginseng, and nutmeg.  This condition usually occurs when you take these medicines or drugs in combination, but it  can also happen with a high dose of a single medicine or drug. What increases the risk? This condition is more likely to develop in:  People who have recently increased the dosage of medicine that increases the serotonin level.  People who just started taking medicine that increases the serotonin level.  What are the signs or symptoms? Symptoms of this condition usually happens within several hours of a medicine change. Symptoms include:  Headache.  Muscle twitching or stiffness.  Diarrhea.  Confusion.  Restlessness or agitation.  Shivering or goose bumps.  Loss of muscle coordination.  Rapid heart rate.  Sweating.  Severe cases of serotonin syndromecan cause:  Irregular heartbeat.  Seizures.  Loss of consciousness.  High fever.  How is this diagnosed? This condition is diagnosed with a medical history and physical exam. You will be asked aboutyour symptoms and your use of medicines and recreational drugs. Your health care provider may also order lab work or additional tests to rule out other causes of your symptoms. How is this treated? The treatment for this condition depends on the severity of your symptoms. For mild cases, stopping the medicine that caused your condition is usually all that is needed. For moderate to severe cases, hospitalization is required to monitor you and to prevent further muscle damage. Follow these instructions at home:  Take over-the-counter and prescription medicines only as told by your health care provider. This is important.  Check with your health care provider before you start taking any new prescriptions, over-the-counter medicines, herbs, or supplements.  Avoid combining any medicines that can cause this condition to occur.  Keep all follow-up visits as told by your health care provider.This is important.  Maintain a healthy lifestyle. ? Eat healthy foods. ? Get plenty of sleep. ? Exercise regularly. ? Do not drink  alcohol. ? Do not use recreational drugs. Contact a health care provider if:  Medicines do not seem to be helping.  Your symptoms do not improve or they get worse.  You have trouble taking care of yourself. Get help right away if:  You have worsening confusion, severe headache, chest pain, high fever, seizures, or loss of consciousness.  You have serious thoughts about hurting yourself or others.  You experience serious side effects of medicine, such as swelling of your face, lips, tongue, or throat. This information is not intended to replace advice given to you by your health care provider. Make sure you discuss any questions you have with your health care provider. Document Released: 10/14/2004 Document Revised: 05/01/2016 Document Reviewed: 09/19/2014 Elsevier Interactive Patient Education  [employer]2018 [employer]

## 2017-12-28 NOTE — Progress Notes (Signed)
Chief Complaint  Patient presents with  . rash on finger left hand  . dicontinuation of zoloft    feeling bad, dizziness, shakiness, hard to concentrate and  dizzy on feet    HPI Serotonin withdrawal syndrome Tapering off zoloft He is experiencing dizziness, unsteadiness, difficulty concentrating, shakiness, bright flashes of light with blinking or closing the eyes.  He also had a headache after stopping the medication. He states that he was not able to function due to inattention He was tapering off his dose of  whole tablet dose every other day then a half tablet every day He states that he took two weeks of half tablet every other day He states that his symptoms started this Monday after taking last dose on Sunday  He reports that he has been having dizziness since taking his last dose of zoloft He reports that his overall mood has been great and had some adjustment disorder Now his mood is better so he wants to remain off of it but is not sure how long this will last He has meds at home He has not taken any adderall since Sunday  ADD Pt has a diagnosis of ADD/ADHD He is taking Adderall 10 twice daily and has good results He does not use other stimulants such as energy drinks or caffeine. He is weaning off sertraline. He is able to sleep at night and denies unintentional weight loss He denies mood swings He skips doses when not working or on weekends Last fill of his adderall 12/03/17 written by Dr. Clelia Croft   Past Medical History:  Diagnosis Date  . Allergy   . Anemia   . Asthma   . Carpal tunnel syndrome   . Carpal tunnel syndrome     Current Outpatient Medications  Medication Sig Dispense Refill  . albuterol (VENTOLIN HFA) 108 (90 Base) MCG/ACT inhaler Inhale 2 puffs into the lungs every 4 (four) hours as needed for wheezing or shortness of breath. 54 g 3  . amphetamine-dextroamphetamine (ADDERALL) 10 MG tablet Take 1 tablet (10 mg total) by mouth 2 (two) times daily.  (Patient taking differently: Take 10 mg by mouth daily as needed (focus). ) 60 tablet 0  . EPINEPHrine (EPI-PEN) 0.3 mg/0.3 mL DEVI Inject 0.3 mLs (0.3 mg total) into the muscle once. 2 Device 0  . Fluticasone Furoate-Vilanterol 200-25 MCG/INH AEPB Inhale 1 puff into the lungs daily.     . hydrOXYzine (ATARAX/VISTARIL) 25 MG tablet Take 0.5-1 tablets (12.5-25 mg total) by mouth at bedtime as needed for itching. (Patient taking differently: Take 25 mg by mouth at bedtime. ) 30 tablet 0  . ibuprofen (ADVIL,MOTRIN) 200 MG tablet Take 600 mg by mouth every 6 (six) hours as needed for headache or moderate pain.     Marland Kitchen levocetirizine (XYZAL) 5 MG tablet Take 5 mg by mouth every evening.    . montelukast (SINGULAIR) 10 MG tablet Take one daily at bedtime (Patient taking differently: Take 10 mg by mouth at bedtime. ) 30 tablet 11  . Multiple Vitamins-Minerals (MULTIVITAMIN WITH MINERALS) tablet Take 1 tablet by mouth daily.    . meclizine (ANTIVERT) 25 MG tablet Take 1 tablet (25 mg total) by mouth 3 (three) times daily as needed for dizziness. 30 tablet 0  . Omega-3 Fatty Acids (FISH OIL) 1000 MG CAPS Take 1,000 mg by mouth daily.    Marland Kitchen triamcinolone ointment (KENALOG) 0.5 % Apply 1 application topically 2 (two) times daily. 30 g 0   No current facility-administered  medications for this visit.     Allergies:  Allergies  Allergen Reactions  . Penicillins     Hives Has patient had a PCN reaction causing immediate rash, facial/tongue/throat swelling, SOB or lightheadedness with hypotension: yes Has patient had a PCN reaction causing severe rash involving mucus membranes or skin necrosis: yes Has patient had a PCN reaction that required hospitalization: no Has patient had a PCN reaction occurring within the last 10 years: yes If all of the above answers are "NO", then may proceed with Cephalosporin use.  Marland Kitchen. Rosemary Oil     No past surgical history on file.  Social History   Socioeconomic History    . Marital status: Married    Spouse name: Not on file  . Number of children: Not on file  . Years of education: Not on file  . Highest education level: Not on file  Occupational History  . Not on file  Social Needs  . Financial resource strain: Not on file  . Food insecurity:    Worry: Not on file    Inability: Not on file  . Transportation needs:    Medical: Not on file    Non-medical: Not on file  Tobacco Use  . Smoking status: Former Games developermoker  . Smokeless tobacco: Never Used  . Tobacco comment: has switched to electronic cigarette  Substance and Sexual Activity  . Alcohol use: Yes    Comment: occassional  . Drug use: No  . Sexual activity: Yes  Lifestyle  . Physical activity:    Days per week: Not on file    Minutes per session: Not on file  . Stress: Not on file  Relationships  . Social connections:    Talks on phone: Not on file    Gets together: Not on file    Attends religious service: Not on file    Active member of club or organization: Not on file    Attends meetings of clubs or organizations: Not on file    Relationship status: Not on file  Other Topics Concern  . Not on file  Social History Narrative  . Not on file    Family History  Problem Relation Age of Onset  . Drug abuse Mother   . Heart failure Mother   . Arthritis Maternal Grandmother   . Heart disease Maternal Grandfather      ROS Review of Systems See HPI Constitution: No fevers or chills No malaise No diaphoresis Skin: No rash or itching Eyes: no blurry vision, no double vision GU: no dysuria or hematuria Neuro: no dizziness or headaches  all others reviewed and negative   Objective: Vitals:   12/28/17 1344  BP: (!) 147/99  Pulse: 88  Resp: 17  Temp: 98.9 F (37.2 C)  TempSrc: Oral  SpO2: 96%  Weight: (!) 316 lb 12.8 oz (143.7 kg)  Height: 5\' 9"  (1.753 m)   BP Readings from Last 3 Encounters:  01/17/18 124/62  01/01/18 (!) 134/94  12/28/17 (!) 147/99      Physical Exam  Constitutional: He is oriented to person, place, and time. He appears well-developed and well-nourished.  HENT:  Head: Normocephalic and atraumatic.  Right Ear: External ear normal.  Left Ear: External ear normal.  Nose: Nose normal.  Mouth/Throat: Oropharynx is clear and moist.  Eyes: Conjunctivae and EOM are normal.  Neck: Normal range of motion. No thyromegaly present.  Cardiovascular: Normal rate, regular rhythm and normal heart sounds.  No murmur heard. Pulmonary/Chest: Effort normal  and breath sounds normal. No stridor. No respiratory distress.  Abdominal: Soft. Bowel sounds are normal. He exhibits no distension and no mass. There is no tenderness. There is no guarding.  Musculoskeletal: Normal range of motion. He exhibits no edema.  Neurological: He is alert and oriented to person, place, and time.  Skin: Skin is warm. Capillary refill takes less than 2 seconds.  Psychiatric: He has a normal mood and affect. His behavior is normal. Judgment and thought content normal.     Assessment and Plan Dajuan was seen today for rash on finger left hand and dicontinuation of zoloft.  Diagnoses and all orders for this visit:  Attention deficit hyperactivity disorder (ADHD), predominantly inattentive type - continue Adderall  Serotonin withdrawal syndrome, initial encounter- improvement in symptoms Will check for contributing factors Follow up prn -     CBC -     TSH -     Basic metabolic panel  Anxiety and depression-  Pt would like to remain off SSRI  Eczema of hand-  Advised topical skin care with cool water and triamcinolone And proper skin care  Other orders -     Cancel: Tdap vaccine greater than or equal to 7yo IM -     triamcinolone ointment (KENALOG) 0.5 %; Apply 1 application topically 2 (two) times daily.   A total of 30 minutes were spent face-to-face with the patient during this encounter and over half of that time was spent on counseling and  coordination of care.   Mercia Dowe A Raekwan Spelman

## 2017-12-29 LAB — BASIC METABOLIC PANEL
BUN/Creatinine Ratio: 9 (ref 9–20)
BUN: 8 mg/dL (ref 6–20)
CALCIUM: 9.3 mg/dL (ref 8.7–10.2)
CO2: 23 mmol/L (ref 20–29)
Chloride: 101 mmol/L (ref 96–106)
Creatinine, Ser: 0.88 mg/dL (ref 0.76–1.27)
GFR calc Af Amer: 130 mL/min/{1.73_m2} (ref >59)
GFR calc non Af Amer: 113 mL/min/{1.73_m2} (ref >59)
GLUCOSE: 85 mg/dL (ref 65–99)
POTASSIUM: 4.4 mmol/L (ref 3.5–5.2)
Sodium: 142 mmol/L (ref 134–144)

## 2017-12-29 LAB — CBC
HEMOGLOBIN: 15.5 g/dL (ref 13.0–17.7)
Hematocrit: 46.2 % (ref 37.5–51.0)
MCH: 27.9 pg (ref 26.6–33.0)
MCHC: 33.5 g/dL (ref 31.5–35.7)
MCV: 83 fL (ref 79–97)
Platelets: 239 10*3/uL (ref 150–379)
RBC: 5.56 x10E6/uL (ref 4.14–5.80)
RDW: 14 % (ref 12.3–15.4)
WBC: 6.5 10*3/uL (ref 3.4–10.8)

## 2017-12-29 LAB — TSH: TSH: 0.617 u[IU]/mL (ref 0.450–4.500)

## 2018-01-01 ENCOUNTER — Other Ambulatory Visit: Payer: Self-pay

## 2018-01-01 ENCOUNTER — Encounter (HOSPITAL_COMMUNITY): Payer: Self-pay | Admitting: Emergency Medicine

## 2018-01-01 ENCOUNTER — Emergency Department (HOSPITAL_COMMUNITY)
Admission: EM | Admit: 2018-01-01 | Discharge: 2018-01-01 | Disposition: A | Discharge status: Home / Self Care | Payer: BLUE CROSS/BLUE SHIELD | Attending: Emergency Medicine | Admitting: Emergency Medicine

## 2018-01-01 DIAGNOSIS — F17290 Nicotine dependence, other tobacco product, uncomplicated: Secondary | ICD-10-CM | POA: Diagnosis not present

## 2018-01-01 DIAGNOSIS — R42 Dizziness and giddiness: Secondary | ICD-10-CM | POA: Diagnosis not present

## 2018-01-01 DIAGNOSIS — Z87891 Personal history of nicotine dependence: Secondary | ICD-10-CM | POA: Diagnosis not present

## 2018-01-01 DIAGNOSIS — Z79899 Other long term (current) drug therapy: Secondary | ICD-10-CM | POA: Insufficient documentation

## 2018-01-01 DIAGNOSIS — I10 Essential (primary) hypertension: Secondary | ICD-10-CM | POA: Insufficient documentation

## 2018-01-01 NOTE — Discharge Instructions (Addendum)
Avoid taking her blood pressure in succession.  If you have any symptoms related to high blood pressure, you need to be reevaluated.

## 2018-01-01 NOTE — ED Notes (Signed)
Patient worried blood pressure is high due to him coming off of Zoloft. Patient has no other complaints.

## 2018-01-01 NOTE — ED Triage Notes (Signed)
Patient has been tapered off of Zoloft and patient is complaining his blood pressure is high now. Patient is concerned.

## 2018-01-01 NOTE — ED Notes (Signed)
Bed: WA03 Expected date:  Expected time:  Means of arrival:  Comments: 

## 2018-01-01 NOTE — ED Provider Notes (Signed)
[facility]-EMERGENCY DEPT Provider Note   CSN: [id number] Arrival date & time: 01/01/18  0121     History   Chief Complaint Chief Complaint  Patient presents with  . Hypertension    HPI Brian Alvarez is a 32 y.o. male.  HPI  This is a 32 year old male with a history of allergies, anemia, obesity who presents with concerns for high blood pressure.  Patient reports that he came off his Zoloft last Sunday.  Since that time he has been having some decreased energy and dizziness.  He saw his primary doctor this week who told him to keep an eye on his blood pressure.  Patient reports that tonight he took his blood pressure and it was in the 180s over 110s.  He denies any chest pain, headache, shortness of breath, any new symptoms at that time.  He states "I was just concerned about the number."  He does not take anything for blood pressure.  No history of diabetes, heart disease, kidney disease.  He reports that he continues to have low energy and some intermittent dizziness but relates this to coming off of Zoloft.  Past Medical History:  Diagnosis Date  . Allergy   . Anemia   . Asthma   . Carpal tunnel syndrome   . Carpal tunnel syndrome     Patient Active Problem List   Diagnosis Date Noted  . Parasomnia, unspecified 10/19/2017  . Allergic asthma due to European house dust mite 10/19/2017  . Confusional arousals 03/28/2017  . PLMD (periodic limb movement disorder) 11/24/2016  . Morbid obesity with body mass index of 45.0-49.9 in adult (HCC) 11/24/2016  . Hypersomnia with sleep apnea 11/24/2016  . Snoring 11/24/2016  . Asthma exacerbation 10/19/2016  . Attention deficit hyperactivity disorder (ADHD) 04/29/2016  . Seasonal allergies 09/12/2012  . Asthma 12/26/2011  . Carpal tunnel syndrome 11/11/2011    History reviewed. No pertinent surgical history.      Home Medications    Prior to Admission medications   Medication Sig Start Date End Date  Taking? Authorizing Provider  albuterol (VENTOLIN HFA) 108 (90 Base) MCG/ACT inhaler Inhale 2 puffs into the lungs every 4 (four) hours as needed for wheezing or shortness of breath. 12/03/17  Yes Sherren MochaShaw, Eva N, MD  amphetamine-dextroamphetamine (ADDERALL) 10 MG tablet Take 1 tablet (10 mg total) by mouth 2 (two) times daily. Patient taking differently: Take 10 mg by mouth daily as needed (focus).  12/03/17  Yes Sherren MochaShaw, Eva N, MD  EPINEPHrine (EPI-PEN) 0.3 mg/0.3 mL DEVI Inject 0.3 mLs (0.3 mg total) into the muscle once. 10/17/12  Yes Elvina SidleLauenstein, Kurt, MD  Fluticasone Furoate-Vilanterol 200-25 MCG/INH AEPB Inhale 1 puff into the lungs daily.    Yes [provider]  hydrOXYzine (ATARAX/VISTARIL) 25 MG tablet Take 0.5-1 tablets (12.5-25 mg total) by mouth at bedtime as needed for itching. Patient taking differently: Take 25 mg by mouth at bedtime.  04/27/16  Yes Bing NeighborsHarris, Kimberly S, FNP  ibuprofen (ADVIL,MOTRIN) 200 MG tablet Take 600 mg by mouth every 6 (six) hours as needed for headache or moderate pain.    Yes [provider]  levocetirizine (XYZAL) 5 MG tablet Take 5 mg by mouth every evening.   Yes [provider]  montelukast (SINGULAIR) 10 MG tablet Take one daily at bedtime Patient taking differently: Take 10 mg by mouth at bedtime.  10/22/13  Yes Collene Gobbleaub, Steven A, MD  Multiple Vitamins-Minerals (MULTIVITAMIN WITH MINERALS) tablet Take 1 tablet by mouth daily.  Yes [provider]  Omega-3 Fatty Acids (FISH OIL) 1000 MG CAPS Take 1,000 mg by mouth daily.   Yes [provider]  triamcinolone ointment (KENALOG) 0.5 % Apply 1 application topically 2 (two) times daily. 12/28/17  Yes Doristine Bosworth, MD    Family History Family History  Problem Relation Age of Onset  . Drug abuse Mother   . Heart failure Mother   . Arthritis Maternal Grandmother   . Heart disease Maternal Grandfather     Social History Social History   Tobacco Use  . Smoking status:  Former Games developer  . Smokeless tobacco: Never Used  . Tobacco comment: has switched to electronic cigarette  Substance Use Topics  . Alcohol use: Yes    Comment: occassional  . Drug use: No     Allergies   Penicillins and Rosemary oil   Review of Systems Review of Systems  Constitutional: Negative for fever.  Respiratory: Negative for shortness of breath.   Cardiovascular: Negative for chest pain.  Gastrointestinal: Negative for abdominal pain, nausea and vomiting.  Genitourinary: Negative for dysuria.  Musculoskeletal: Negative for back pain.  Neurological: Positive for dizziness. Negative for headaches.  All other systems reviewed and are negative.    Physical Exam Updated Vital Signs BP (!) 134/94 (BP Location: Left Arm)   Pulse 88   Temp 98.3 F (36.8 C) (Oral)   Resp 18   Ht 5\' 10"  (1.778 m)   Wt (!) 146.1 kg (322 lb)   SpO2 97%   BMI 46.20 kg/m   Physical Exam  Constitutional: He is oriented to person, place, and time. He appears well-developed and well-nourished.  Morbidly obese, no acute distress  HENT:  Head: Normocephalic and atraumatic.  Eyes:  Pulse 4 mm reactive bilaterally  Cardiovascular: Normal rate, regular rhythm and normal heart sounds.  No murmur heard. Pulmonary/Chest: Effort normal and breath sounds normal. No respiratory distress. He has no wheezes.  Abdominal: Soft. Bowel sounds are normal. There is no tenderness. There is no rebound.  Musculoskeletal: He exhibits no edema.  Neurological: He is alert and oriented to person, place, and time.  Fluent speech, cranial nerves II through XII intact, no dysmetria to finger-nose-finger, 5 out of 5 strength in all 4 extremities  Skin: Skin is warm and dry.  Psychiatric: He has a normal mood and affect.  Nursing note and vitals reviewed.    ED Treatments / Results  Labs (all labs ordered are listed, but only abnormal results are displayed) Labs Reviewed - No data to  display  EKG None  Radiology No results found.  Procedures Procedures (including critical care time)  Medications Ordered in ED Medications - No data to display   Initial Impression / Assessment and Plan / ED Course  I have reviewed the triage vital signs and the nursing notes.  Pertinent labs & imaging results that were available during my care of the patient were reviewed by me and considered in my medical decision making (see chart for details).     Patient presents with concerns for high blood pressure.  Recently tapered off of his SSRI.  He is nontoxic appearing on exam and vital signs are reassuring.  Blood pressure here is 134/94.  Patient is otherwise asymptomatic.  He is neurologically intact.  Doubt hypertensive urgency or emergency.  Patient's physical exam is benign.  Discussed with patient that isolated high blood pressure readings without symptoms do not require any specific workup at this time; however, persistently elevated  blood pressure may require evaluation and treatment.  Patient was reassured.  I discussed with him that he should not take his blood pressure in succession as this may artificially elevate the number on an automatic cuff.  Patient stated understanding.  He will follow-up with his primary physician.  After history, exam, and medical workup I feel the patient has been appropriately medically screened and is safe for discharge home. Pertinent diagnoses were discussed with the patient. Patient was given return precautions.   Final Clinical Impressions(s) / ED Diagnoses   Final diagnoses:  Essential hypertension    ED Discharge Orders    None       Shon Baton, MD 01/01/18 [id number]

## 2018-01-17 ENCOUNTER — Encounter: Payer: Self-pay | Admitting: Physician Assistant

## 2018-01-17 ENCOUNTER — Ambulatory Visit: Payer: BLUE CROSS/BLUE SHIELD | Admitting: Physician Assistant

## 2018-01-17 ENCOUNTER — Other Ambulatory Visit: Payer: Self-pay

## 2018-01-17 VITALS — BP 124/62 | HR 111 | Temp 98.4°F | Resp 18 | Ht 70.0 in | Wt 316.4 lb

## 2018-01-17 DIAGNOSIS — M25511 Pain in right shoulder: Secondary | ICD-10-CM | POA: Diagnosis not present

## 2018-01-17 DIAGNOSIS — T43205A Adverse effect of unspecified antidepressants, initial encounter: Secondary | ICD-10-CM | POA: Diagnosis not present

## 2018-01-17 DIAGNOSIS — R42 Dizziness and giddiness: Secondary | ICD-10-CM | POA: Diagnosis not present

## 2018-01-17 MED ORDER — MECLIZINE HCL 25 MG PO TABS: 25.0000 mg | ORAL_TABLET | Freq: Three times a day (TID) | ORAL | 0 refills | Status: DC | PRN

## 2018-01-17 NOTE — Progress Notes (Signed)
Brian Alvarez  MRN: [id number] DOB: 04-10-1986  PCP: Morrell Riddle, PA-C  Chief Complaint  Patient presents with  . Medication Problem    came off zoloft and still having side effects from coming off     Subjective:  Pt presents to clinic for for concerns that he is still suffering from serotonin withdrawal since reducing his Zoloft over the last month.  He has been without his Zoloft for 20 days.  The worst of his symptoms was approximately 1 week after he stopped his Zoloft where he had trouble with BP fluctuations (150/100 with symptoms and in the 150s/80s without symptoms), temperature, dizzy (unsteady no vertigo) and headache.  When he checks his blood pressure at home he will sometimes try to use a manual blood pressure cuff on himself other times he will use a wrist cuff.  He does state that he does hold his arm in the air when he checks a automatic cuff as well as he checks it multiple times noticing that the blood pressure increases as he continues to check duplicate blood pressure readings.  He does feel like it is possible that his lingering anxiety may be contributing to some of the symptoms he is blaming on the Zoloft.  He definitely knows as the withdrawal symptoms get worse that he does get more anxious.  Zoloft - tapered - stopped 20days ago also no Adderall consistent use since stopping Zoloft.  Depression resolved - he still has some anxiety but the zoloft never really helped that -this is why he decided to taper off of his Zoloft.  Since tapering off of the Zoloft he has felt no return of his depression symptoms even with some of these withdrawal symptoms he is experiencing.  5day old baby at home - Brian Alvarez he is not sleeping well which is unusual for him -when he has a chance to take a nap he finds himself laying there for a period of time before falling asleep.  He is wondering if I would look at a right posterior shoulder pain that he is having at times.  He does not get it  at his desk at work or at his desk at home, he does get it when sitting at the dining room table as well as sitting on the couch.  He has noticed that it has decreased his sleeping also due to pain he does sleep on his stomach and he is found changing position does help with the pain.  When he does have the pain rubbing on the area does help resolve the pain.  He has taken nothing to help this pain.  He did not have an injury that he knows of.  History is obtained by patient.  Review of Systems  Constitutional: Negative for chills and fever.  Gastrointestinal: Negative for abdominal pain, diarrhea and rectal pain.  Neurological: Positive for dizziness (May be), light-headedness and headaches.  Psychiatric/Behavioral: Negative for decreased concentration and dysphoric mood. The patient is nervous/anxious.     Patient Active Problem List   Diagnosis Date Noted  . Parasomnia, unspecified 10/19/2017  . Allergic asthma due to European house dust mite 10/19/2017  . Confusional arousals 03/28/2017  . PLMD (periodic limb movement disorder) 11/24/2016  . Morbid obesity with body mass index of 45.0-49.9 in adult (HCC) 11/24/2016  . Hypersomnia with sleep apnea 11/24/2016  . Snoring 11/24/2016  . Asthma exacerbation 10/19/2016  . Attention deficit hyperactivity disorder (ADHD) 04/29/2016  . Seasonal allergies 09/12/2012  . Asthma  12/26/2011  . Carpal tunnel syndrome 11/11/2011    Current Outpatient Medications on File Prior to Visit  Medication Sig Dispense Refill  . albuterol (VENTOLIN HFA) 108 (90 Base) MCG/ACT inhaler Inhale 2 puffs into the lungs every 4 (four) hours as needed for wheezing or shortness of breath. 54 g 3  . amphetamine-dextroamphetamine (ADDERALL) 10 MG tablet Take 1 tablet (10 mg total) by mouth 2 (two) times daily. (Patient taking differently: Take 10 mg by mouth daily as needed (focus). ) 60 tablet 0  . EPINEPHrine (EPI-PEN) 0.3 mg/0.3 mL DEVI Inject 0.3 mLs (0.3 mg total)  into the muscle once. 2 Device 0  . Fluticasone Furoate-Vilanterol 200-25 MCG/INH AEPB Inhale 1 puff into the lungs daily.     . hydrOXYzine (ATARAX/VISTARIL) 25 MG tablet Take 0.5-1 tablets (12.5-25 mg total) by mouth at bedtime as needed for itching. (Patient taking differently: Take 25 mg by mouth at bedtime. ) 30 tablet 0  . ibuprofen (ADVIL,MOTRIN) 200 MG tablet Take 600 mg by mouth every 6 (six) hours as needed for headache or moderate pain.     Marland Kitchen levocetirizine (XYZAL) 5 MG tablet Take 5 mg by mouth every evening.    . montelukast (SINGULAIR) 10 MG tablet Take one daily at bedtime (Patient taking differently: Take 10 mg by mouth at bedtime. ) 30 tablet 11  . Multiple Vitamins-Minerals (MULTIVITAMIN WITH MINERALS) tablet Take 1 tablet by mouth daily.    . Omega-3 Fatty Acids (FISH OIL) 1000 MG CAPS Take 1,000 mg by mouth daily.    Marland Kitchen triamcinolone ointment (KENALOG) 0.5 % Apply 1 application topically 2 (two) times daily. 30 g 0   No current facility-administered medications on file prior to visit.     Allergies  Allergen Reactions  . Penicillins     Hives Has patient had a PCN reaction causing immediate rash, facial/tongue/throat swelling, SOB or lightheadedness with hypotension: yes Has patient had a PCN reaction causing severe rash involving mucus membranes or skin necrosis: yes Has patient had a PCN reaction that required hospitalization: no Has patient had a PCN reaction occurring within the last 10 years: yes If all of the above answers are "NO", then may proceed with Cephalosporin use.  Marland Kitchen Judi Saa     Past Medical History:  Diagnosis Date  . Allergy   . Anemia   . Asthma   . Carpal tunnel syndrome   . Carpal tunnel syndrome    Social History   Social History Narrative  . Not on file   Social History   Tobacco Use  . Smoking status: Former Games developer  . Smokeless tobacco: Never Used  . Tobacco comment: has switched to electronic cigarette  Substance Use Topics    . Alcohol use: Yes    Comment: occassional  . Drug use: No   family history includes Arthritis in his maternal grandmother; Drug abuse in his mother; Heart disease in his maternal grandfather; Heart failure in his mother.     Objective:  BP 124/62   Pulse (!) 111   Temp 98.4 F (36.9 C) (Oral)   Resp 18   Ht  (1.778 m)   Wt (!) 316 lb 6.4 oz (143.5 kg)   SpO2 96%   BMI 45.40 kg/m  Body mass index is 45.4 kg/m.  Physical Exam  Constitutional: He is oriented to person, place, and time. He appears well-developed and well-nourished.  HENT:  Head: Normocephalic and atraumatic.  Right Ear: External ear normal.  Left Ear:  External ear normal.  Eyes: Pupils are equal, round, and reactive to light. Conjunctivae, EOM and lids are normal.  Neck: Normal range of motion.  Cardiovascular: Normal rate, regular rhythm and normal heart sounds.  No murmur heard. Pulmonary/Chest: Effort normal and breath sounds normal. He has no wheezes.  Musculoskeletal:       Right shoulder: Normal. He exhibits normal range of motion, no tenderness, no bony tenderness and no spasm (none felt but the muscle over the scapula is where he feels the pain).  Neurological: He is alert and oriented to person, place, and time. He has normal strength and normal reflexes. No cranial nerve deficit or sensory deficit.  Skin: Skin is warm and dry.  Psychiatric: He has a normal mood and affect. His behavior is normal. Judgment and thought content normal.    Assessment and Plan :  Serotonin withdrawal syndrome, initial encounter -this does seem consistent with decrease of an taper off of Zoloft he is improved we will continue to not be on the Zoloft and we would expect the symptoms to resolve with time.  Have to question the involvement of his anxiety and his current symptoms as well as sleep deprivation due to being and his father.  He will continue to monitor his blood pressure today in the office it is fine gave  him guidelines based on the appropriate way to check a blood pressure with an automated cuff.  He will recheck with me in 2 weeks to look for his resolution of symptoms.  Dizzy - Plan: meclizine (ANTIVERT) 25 MG tablet -we will treat his dizziness which does not sound like vertigo but it is a possibility with meclizine to see if we can have some symptomatic resolution.  Acute pain of right shoulder -this seems muscular in origin.  Patient is a new father do not want to put on any muscle relaxers due to increased sedation side effects.  Heat and massage were recommended for patient if symptoms get worse he will return to the clinic.  Benny Lennert PA-C  Primary Care at [facility] 01/17/2018 7:01 PM

## 2018-01-17 NOTE — Patient Instructions (Addendum)
  [facility] - Brian Alvarez   IF you received an x-ray today, you will receive an invoice from [facility]. Please contact [facility] at [phone] with questions or concerns regarding your invoice.   IF you received labwork today, you will receive an invoice from [facility]. Please contact [facility] at [phone] with questions or concerns regarding your invoice.   Our billing staff will not be able to assist you with questions regarding bills from these companies.  You will be contacted with the lab results as soon as they are available. The fastest way to get your results is to activate your My Chart account. Instructions are located on the last page of this paperwork. If you have not heard from [place] regarding the results in 2 weeks, please contact this office.

## 2018-04-10 ENCOUNTER — Ambulatory Visit: Payer: BLUE CROSS/BLUE SHIELD | Admitting: Family Medicine

## 2018-04-27 IMAGING — DX DG CHEST 2V
2 series · 2 of 2 positions shown · non-contrast
Comparison: No recent prior .

CLINICAL DATA: Cough and fever .

EXAM:
CHEST  2 VIEW

[chest pa]
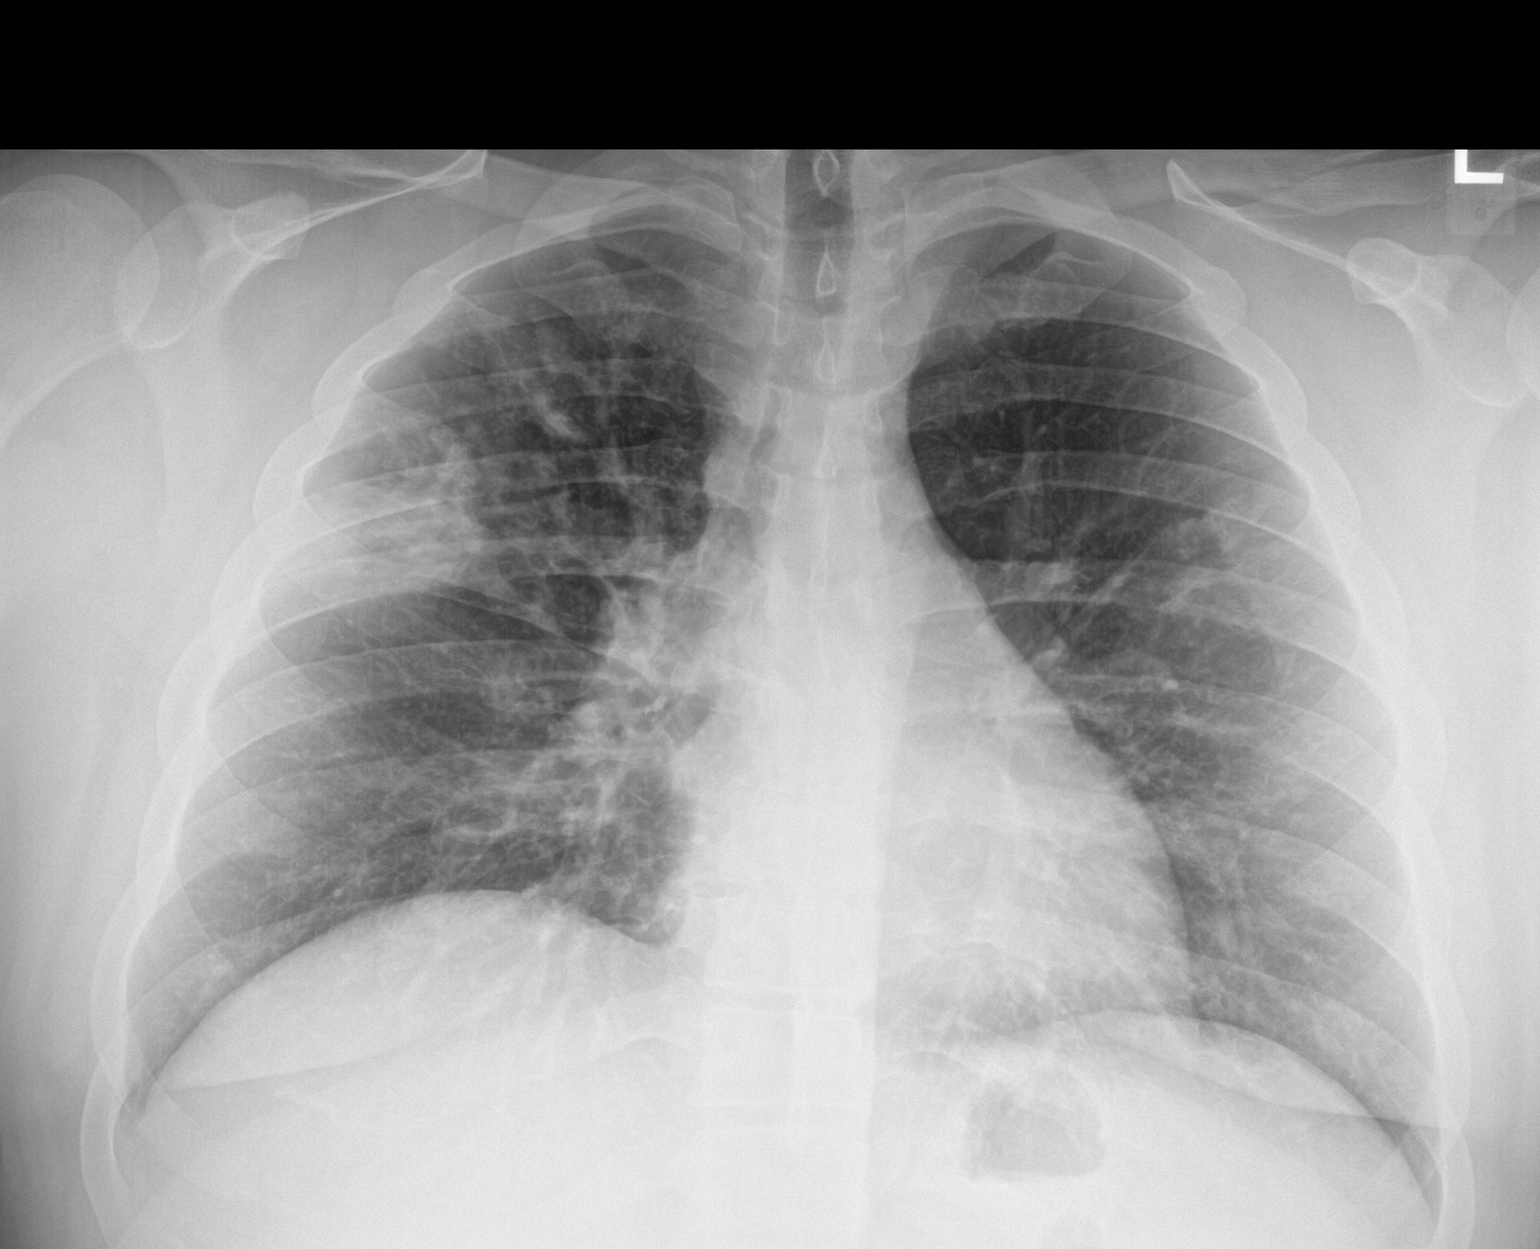

[chest lat]
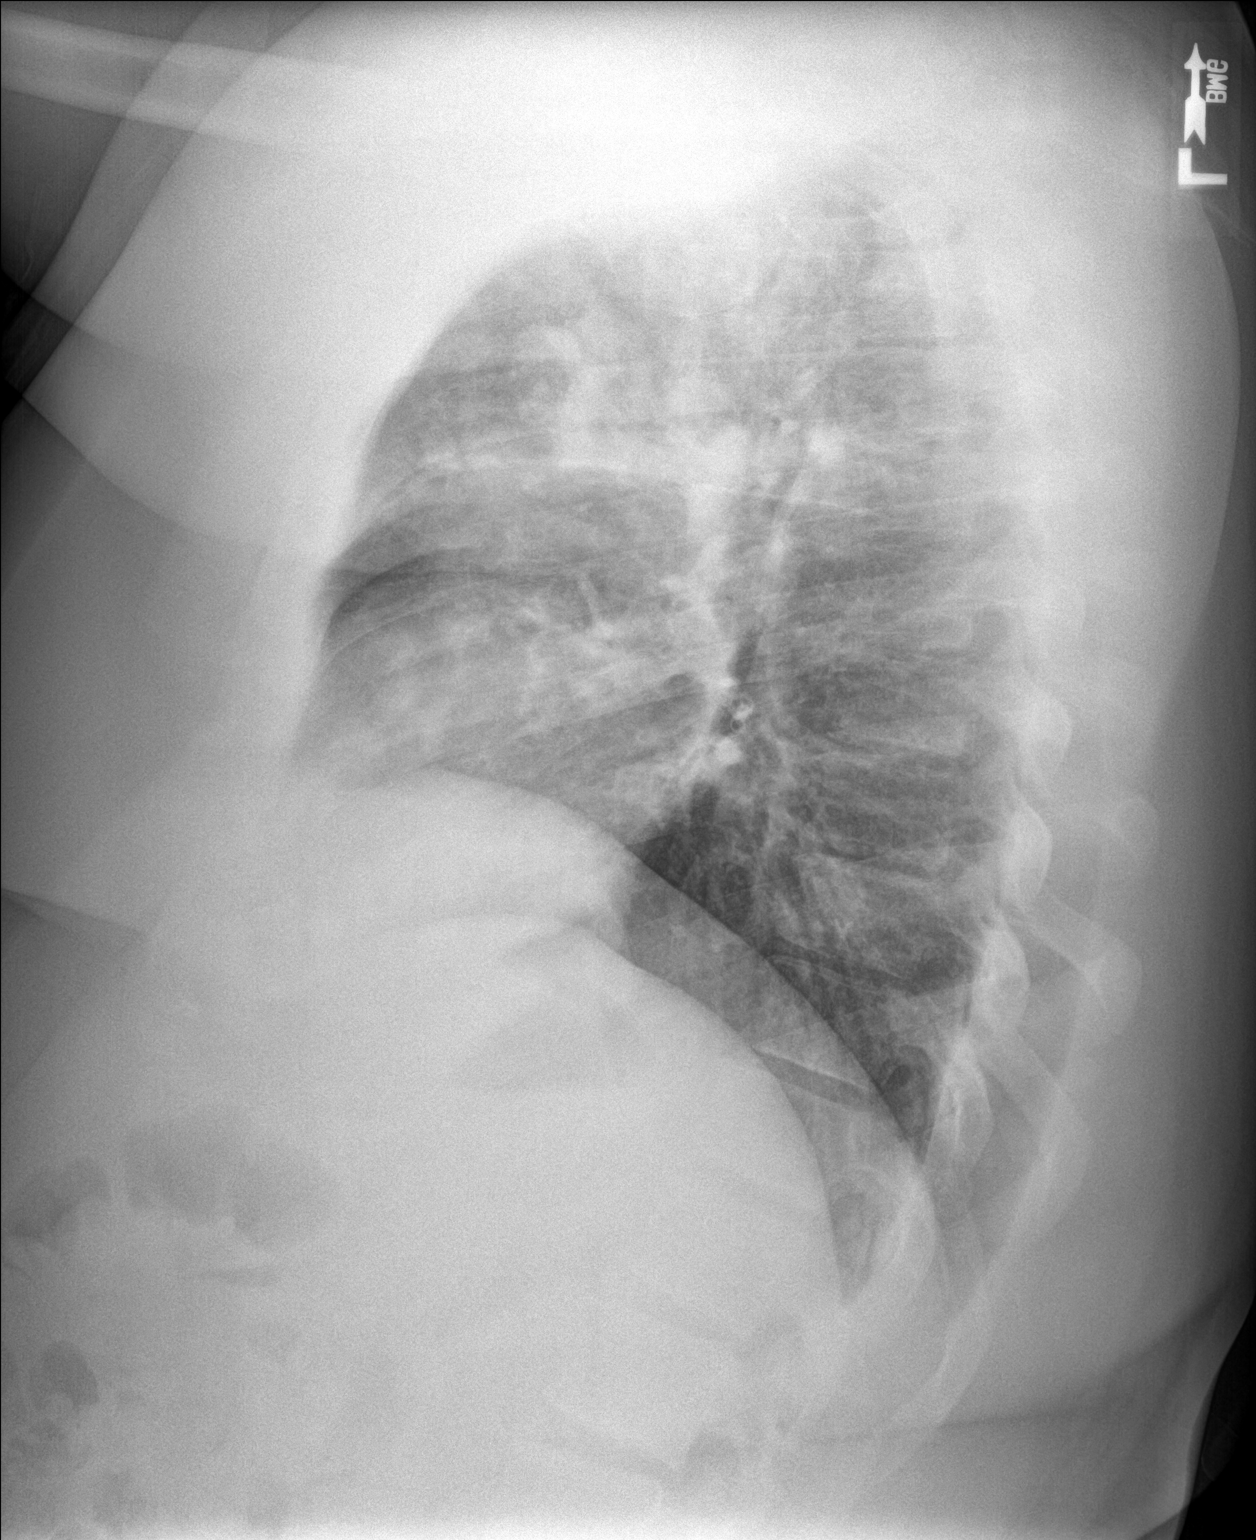

[2 of 2 positions shown; findings below may reference images not displayed]

FINDINGS: Mediastinum is normal. Multifocal bilateral pulmonary infiltrates
are noted. Pulmonary infiltrates particularly prominent in the right
upper lobe and right middle lobe. No pleural effusion or
pneumothorax. Heart size normal. No acute bony abnormality.
IMPRESSION: Multifocal bilateral pulmonary infiltrates consistent with
pneumonia.

## 2018-05-19 DIAGNOSIS — J069 Acute upper respiratory infection, unspecified: Secondary | ICD-10-CM | POA: Diagnosis not present

## 2018-08-26 ENCOUNTER — Encounter: Payer: Self-pay | Admitting: Family Medicine

## 2018-08-26 ENCOUNTER — Other Ambulatory Visit: Payer: Self-pay

## 2018-08-26 ENCOUNTER — Ambulatory Visit: Payer: BLUE CROSS/BLUE SHIELD | Admitting: Family Medicine

## 2018-08-26 VITALS — BP 138/90 | HR 88 | Temp 98.0°F | Resp 16 | Ht 70.0 in | Wt 318.0 lb

## 2018-08-26 DIAGNOSIS — Z23 Encounter for immunization: Secondary | ICD-10-CM

## 2018-08-26 DIAGNOSIS — F419 Anxiety disorder, unspecified: Secondary | ICD-10-CM

## 2018-08-26 MED ORDER — BUSPIRONE HCL 10 MG PO TABS: 10.0000 mg | ORAL_TABLET | Freq: Two times a day (BID) | ORAL | 2 refills | Status: DC

## 2018-08-26 NOTE — Progress Notes (Signed)
Patient ID: Brian Alvarez, male    DOB: 06-03-1986, 32 y.o.   MRN: [id number]  PCP: Patient, No Pcp Per  Chief Complaint  Patient presents with  . Anxiety  . Back Pain    Subjective:  HPI Brian Alvarez is a 31 y.o. male presents for evaluation anxiety and back pain.  Increase stress at work. Increase work demands. 8-9 hours per day. Take work home routinely. Worrying about job and job security without a logical reason. Asking multiple "what if questions". Current level of anxiety is affecting attention span and increasing irritability. Previously followed by a therapist for anxiety and had been able to manage without medication. Anxiety is affecting personal relationship with spouse as he is more irritable. Denies suicidal ideations, homicidal ideations, or auditory hallucinations.  Social History   Socioeconomic History  . Marital status: Married    Spouse name: Not on file  . Number of children: Not on file  . Years of education: Not on file  . Highest education level: Not on file  Occupational History  . Not on file  Social Needs  . Financial resource strain: Not on file  . Food insecurity:    Worry: Not on file    Inability: Not on file  . Transportation needs:    Medical: Not on file    Non-medical: Not on file  Tobacco Use  . Smoking status: Former Games developer  . Smokeless tobacco: Never Used  . Tobacco comment: has switched to electronic cigarette  Substance and Sexual Activity  . Alcohol use: Yes    Comment: occassional  . Drug use: No  . Sexual activity: Yes  Lifestyle  . Physical activity:    Days per week: Not on file    Minutes per session: Not on file  . Stress: Not on file  Relationships  . Social connections:    Talks on phone: Not on file    Gets together: Not on file    Attends religious service: Not on file    Active member of club or organization: Not on file    Attends meetings of clubs or organizations: Not on file    Relationship status: Not on file   . Intimate partner violence:    Fear of current or ex partner: Not on file    Emotionally abused: Not on file    Physically abused: Not on file    Forced sexual activity: Not on file  Other Topics Concern  . Not on file  Social History Narrative  . Not on file    Family History  Problem Relation Age of Onset  . Drug abuse Mother   . Heart failure Mother   . Arthritis Maternal Grandmother   . Heart disease Maternal Grandfather    Review of Systems Pertinent negatives listed in HPI Patient Active Problem List   Diagnosis Date Noted  . Parasomnia, unspecified 10/19/2017  . Allergic asthma due to European house dust mite 10/19/2017  . Confusional arousals 03/28/2017  . PLMD (periodic limb movement disorder) 11/24/2016  . Morbid obesity with body mass index of 45.0-49.9 in adult (HCC) 11/24/2016  . Hypersomnia with sleep apnea 11/24/2016  . Snoring 11/24/2016  . Asthma exacerbation 10/19/2016  . Attention deficit hyperactivity disorder (ADHD) 04/29/2016  . Seasonal allergies 09/12/2012  . Asthma 12/26/2011  . Carpal tunnel syndrome 11/11/2011    Allergies  Allergen Reactions  .        Marland Kitchen Rosemary Oil     Prior to Admission medications  Medication Sig Start Date End Date Taking? Authorizing Provider  albuterol (VENTOLIN HFA) 108 (90 Base) MCG/ACT inhaler Inhale 2 puffs into the lungs every 4 (four) hours as needed for wheezing or shortness of breath. 12/03/17  Yes Sherren Mocha, MD  amphetamine-dextroamphetamine (ADDERALL) 10 MG tablet Take 1 tablet (10 mg total) by mouth 2 (two) times daily. Patient taking differently: Take 10 mg by mouth daily as needed (focus).  12/03/17  Yes Sherren Mocha, MD  EPINEPHrine (EPI-PEN) 0.3 mg/0.3 mL DEVI Inject 0.3 mLs (0.3 mg total) into the muscle once. 10/17/12  Yes Elvina Sidle, MD  Fluticasone Furoate-Vilanterol 200-25 MCG/INH AEPB Inhale 1 puff into the lungs daily.    Yes [provider]  hydrOXYzine (ATARAX/VISTARIL) 25 MG  tablet Take 0.5-1 tablets (12.5-25 mg total) by mouth at bedtime as needed for itching. Patient taking differently: Take 25 mg by mouth at bedtime.  04/27/16  Yes Bing Neighbors, FNP  ibuprofen (ADVIL,MOTRIN) 200 MG tablet Take 600 mg by mouth every 6 (six) hours as needed for headache or moderate pain.    Yes [provider]  levocetirizine (XYZAL) 5 MG tablet Take 5 mg by mouth every evening.   Yes [provider]  montelukast (SINGULAIR) 10 MG tablet Take one daily at bedtime Patient taking differently: Take 10 mg by mouth at bedtime.  10/22/13  Yes Collene Gobble, MD  Multiple Vitamins-Minerals (MULTIVITAMIN WITH MINERALS) tablet Take 1 tablet by mouth daily.   Yes [provider]  Omega-3 Fatty Acids (FISH OIL) 1000 MG CAPS Take 1,000 mg by mouth daily.   Yes [provider]    Past Medical, Surgical Family and Social History reviewed and updated.    Objective:   Today's Vitals   08/26/18 1145  BP: 138/90  Pulse: 88  Resp: 16  Temp: 98 F (36.7 C)  TempSrc: Oral  SpO2: 98%  Weight: (!) 318 lb (144.2 kg)  Height: 5\' 10"  (1.778 m)    Wt Readings from Last 3 Encounters:  08/26/18 (!) 318 lb (144.2 kg)  01/17/18 (!) 316 lb 6.4 oz (143.5 kg)  01/01/18 (!) 322 lb (146.1 kg)     Physical Exam General appearance: alert, well developed, well nourished, cooperative and in no distress Head: Normocephalic, without obvious abnormality, atraumatic Respiratory: Respirations even and unlabored, normal respiratory rate Heart: rate and rhythm normal. No gallop or murmurs noted on exam  Extremities: No gross deformities Skin: Skin color, texture, turgor normal. No rashes seen  Psych: Appropriate mood and affect. Neurologic: Mental status: Alert, oriented to person, place, and time, thought content appropriate.  Lab Results  Component Value Date   POCGLU 84 10/22/2013    Lab Results  Component Value Date   HGBA1C 5.7 07/19/2015             Assessment & Plan:  1. Need for prophylactic vaccination and inoculation against influenza  2. Anxiety disorder, unspecified type, chronic, worsening Will trial Buspar 10 mg initially twice daily may increase to 3 times daily if symptoms worsen or not completely relieved.  Encouraged to re-establish with counselor, I feel talk therapy will be beneficial in addition to the medication.  Patient verbalized agreement and understanding of plan.  He was also encouraged to establish with a provider his PCP is no longer at this location.  Back pain not addressed given length of visit used discussing anxiety disorder    -The patient was given clear instructions to go to ER or return  to medical center if symptoms do not improve, worsen or new problems develop. The patient verbalized understanding.     Godfrey PickKimberly S. Tiburcio PeaHarris, FNP-C Nurse Practitioner (PRN Staff)  [facility] [address] [place], [place]  [phone]

## 2018-08-26 NOTE — Patient Instructions (Addendum)
If you have lab work done today you will be contacted with your lab results within the next 2 weeks.  If you have not heard from [place] then please contact us. The fastest way to get your results is to register for My Chart.   IF you received an x-ray today, you will receive an invoice from [facility]. Please contact [facility] at [phone] with questions or concerns regarding your invoice.   IF you received labwork today, you will receive an invoice from [facility]. Please contact [facility] at [phone] with questions or concerns regarding your invoice.   Our billing staff will not be able to assist you with questions regarding bills from these companies.  You will be contacted with the lab results as soon as they are available. The fastest way to get your results is to activate your My Chart account. Instructions are located on the last page of this paperwork. If you have not heard from [place] regarding the results in 2 weeks, please contact this office.       Generalized Anxiety Disorder, Adult Generalized anxiety disorder (GAD) is a mental health disorder. People with this condition constantly worry about everyday events. Unlike normal anxiety, worry related to GAD is not triggered by a specific event. These worries also do not fade or get better with time. GAD interferes with life functions, including relationships, work, and school. GAD can vary from mild to severe. People with severe GAD can have intense waves of anxiety with physical symptoms (panic attacks). What are the causes? The exact cause of GAD is not known. What increases the risk? This condition is more likely to develop in:  Women.  People who have a family history of anxiety disorders.  People who are very shy.  People who experience very stressful life events, such as the death of a loved one.  People who have a very stressful family environment.  What are the signs or symptoms? People  with GAD often worry excessively about many things in their lives, such as their health and family. They may also be overly concerned about:  Doing well at work.  Being on time.  Natural disasters.  Friendships.  Physical symptoms of GAD include:  Fatigue.  Muscle tension or having muscle twitches.  Trembling or feeling shaky.  Being easily startled.  Feeling like your heart is pounding or racing.  Feeling out of breath or like you cannot take a deep breath.  Having trouble falling asleep or staying asleep.  Sweating.  Nausea, diarrhea, or irritable bowel syndrome (IBS).  Headaches.  Trouble concentrating or remembering facts.  Restlessness.  Irritability.  How is this diagnosed? Your health care provider can diagnose GAD based on your symptoms and medical history. You will also have a physical exam. The health care provider will ask specific questions about your symptoms, including how severe they are, when they started, and if they come and go. Your health care provider may ask you about your use of alcohol or drugs, including prescription medicines. Your health care provider may refer you to a mental health specialist for further evaluation. Your health care provider will do a thorough examination and may perform additional tests to rule out other possible causes of your symptoms. To be diagnosed with GAD, a person must have anxiety that:  Is out of his or her control.  Affects several different aspects of his or her life, such as work and relationships.  Causes distress that makes him or  her unable to take part in normal activities.  Includes at least three physical symptoms of GAD, such as restlessness, fatigue, trouble concentrating, irritability, muscle tension, or sleep problems.  Before your health care provider can confirm a diagnosis of GAD, these symptoms must be present more days than they are not, and they must last for six months or longer. How is  this treated? The following therapies are usually used to treat GAD:  Medicine. Antidepressant medicine is usually prescribed for long-term daily control. Antianxiety medicines may be added in severe cases, especially when panic attacks occur.  Talk therapy (psychotherapy). Certain types of talk therapy can be helpful in treating GAD by providing support, education, and guidance. Options include: ? Cognitive behavioral therapy (CBT). People learn coping skills and techniques to ease their anxiety. They learn to identify unrealistic or negative thoughts and behaviors and to replace them with positive ones. ? Acceptance and commitment therapy (ACT). This treatment teaches people how to be mindful as a way to cope with unwanted thoughts and feelings. ? Biofeedback. This process trains you to manage your body's response (physiological response) through breathing techniques and relaxation methods. You will work with a therapist while machines are used to monitor your physical symptoms.  Stress management techniques. These include yoga, meditation, and exercise.  A mental health specialist can help determine which treatment is best for you. Some people see improvement with one type of therapy. However, other people require a combination of therapies. Follow these instructions at home:  Take over-the-counter and prescription medicines only as told by your health care provider.  Try to maintain a normal routine.  Try to anticipate stressful situations and allow extra time to manage them.  Practice any stress management or self-calming techniques as taught by your health care provider.  Do not punish yourself for setbacks or for not making progress.  Try to recognize your accomplishments, even if they are small.  Keep all follow-up visits as told by your health care provider. This is important. Contact a health care provider if:  Your symptoms do not get better.  Your symptoms get  worse.  You have signs of depression, such as: ? A persistently sad, cranky, or irritable mood. ? Loss of enjoyment in activities that used to bring you joy. ? Change in weight or eating. ? Changes in sleeping habits. ? Avoiding friends or family members. ? Loss of energy for normal tasks. ? Feelings of guilt or worthlessness. Get help right away if:  You have serious thoughts about hurting yourself or others. If you ever feel like you may hurt yourself or others, or have thoughts about taking your own life, get help right away. You can go to your nearest emergency department or call:  Your local emergency services (911 in the [place]  A suicide crisis helpline, such as the [facility] at (815)863-8378. This is open 24 hours a day.  Summary  Generalized anxiety disorder (GAD) is a mental health disorder that involves worry that is not triggered by a specific event.  People with GAD often worry excessively about many things in their lives, such as their health and family.  GAD may cause physical symptoms such as restlessness, trouble concentrating, sleep problems, frequent sweating, nausea, diarrhea, headaches, and trembling or muscle twitching.  A mental health specialist can help determine which treatment is best for you. Some people see improvement with one type of therapy. However, other people require a combination of therapies. This information is not  intended to replace advice given to you by your health care provider. Make sure you discuss any questions you have with your health care provider. Document Released: 01/01/2013 Document Revised: 07/27/2016 Document Reviewed: 07/27/2016 Elsevier Interactive Patient Education  2018 [employer]    Buspirone tablets What is this medicine? BUSPIRONE (byoo SPYE rone) is used to treat anxiety disorders. This medicine may be used for other purposes; ask your health care provider or pharmacist if you have  questions. COMMON BRAND NAME(S): BuSpar What should I tell my health care provider before I take this medicine? They need to know if you have any of these conditions: -kidney or liver disease -an unusual or allergic reaction to buspirone, other medicines, foods, dyes, or preservatives -pregnant or trying to get pregnant -breast-feeding How should I use this medicine? Take this medicine by mouth with a glass of water. Follow the directions on the prescription label. You may take this medicine with or without food. To ensure that this medicine always works the same way for you, you should take it either always with or always without food. Take your doses at regular intervals. Do not take your medicine more often than directed. Do not stop taking except on the advice of your doctor or health care professional. Talk to your pediatrician regarding the use of this medicine in children. Special care may be needed. Overdosage: If you think you have taken too much of this medicine contact a poison control center or emergency room at once. NOTE: This medicine is only for you. Do not share this medicine with others. What if I miss a dose? If you miss a dose, take it as soon as you can. If it is almost time for your next dose, take only that dose. Do not take double or extra doses. What may interact with this medicine? Do not take this medicine with any of the following medications: -linezolid -MAOIs like Carbex, Eldepryl, Marplan, Nardil, and Parnate -methylene blue -procarbazine This medicine may also interact with the following medications: -diazepam -digoxin -diltiazem -erythromycin -grapefruit juice -haloperidol -medicines for mental depression or mood problems -medicines for seizures like carbamazepine, phenobarbital and phenytoin -nefazodone -other medications for anxiety -rifampin -ritonavir -some antifungal medicines like itraconazole, ketoconazole, and  voriconazole -verapamil -warfarin This list may not describe all possible interactions. Give your health care provider a list of all the medicines, herbs, non-prescription drugs, or dietary supplements you use. Also tell them if you smoke, drink alcohol, or use illegal drugs. Some items may interact with your medicine. What should I watch for while using this medicine? Visit your doctor or health care professional for regular checks on your progress. It may take 1 to 2 weeks before your anxiety gets better. You may get drowsy or dizzy. Do not drive, use machinery, or do anything that needs mental alertness until you know how this drug affects you. Do not stand or sit up quickly, especially if you are an older patient. This reduces the risk of dizzy or fainting spells. Alcohol can make you more drowsy and dizzy. Avoid alcoholic drinks. What side effects may I notice from receiving this medicine? Side effects that you should report to your doctor or health care professional as soon as possible: -blurred vision or other vision changes -chest pain -confusion -difficulty breathing -feelings of hostility or anger -muscle aches and pains -numbness or tingling in hands or feet -ringing in the ears -skin rash and itching -vomiting -weakness Side effects that usually do not require medical attention (  report to your doctor or health care professional if they continue or are bothersome): -disturbed dreams, nightmares -headache -nausea -restlessness or nervousness -sore throat and nasal congestion -stomach upset This list may not describe all possible side effects. Call your doctor for medical advice about side effects. You may report side effects to FDA at 1-800-FDA-1088. Where should I keep my medicine? Keep out of the reach of children. Store at room temperature below 30 degrees C (86 degrees F). Protect from light. Keep container tightly closed. Throw away any unused medicine after the  expiration date. NOTE: This sheet is a summary. It may not cover all possible information. If you have questions about this medicine, talk to your doctor, pharmacist, or health care provider.  2018 Elsevier/Gold Standard (2010-04-16 18:06:11)

## 2018-09-01 ENCOUNTER — Emergency Department (HOSPITAL_COMMUNITY): Payer: BLUE CROSS/BLUE SHIELD

## 2018-09-01 ENCOUNTER — Encounter (HOSPITAL_COMMUNITY): Payer: Self-pay

## 2018-09-01 ENCOUNTER — Other Ambulatory Visit: Payer: Self-pay

## 2018-09-01 ENCOUNTER — Emergency Department (HOSPITAL_COMMUNITY)
Admission: EM | Admit: 2018-09-01 | Discharge: 2018-09-01 | Disposition: A | Discharge status: Home / Self Care | Payer: BLUE CROSS/BLUE SHIELD | Attending: Emergency Medicine | Admitting: Emergency Medicine

## 2018-09-01 DIAGNOSIS — Z79899 Other long term (current) drug therapy: Secondary | ICD-10-CM | POA: Insufficient documentation

## 2018-09-01 DIAGNOSIS — M5489 Other dorsalgia: Secondary | ICD-10-CM | POA: Diagnosis not present

## 2018-09-01 DIAGNOSIS — J45909 Unspecified asthma, uncomplicated: Secondary | ICD-10-CM | POA: Diagnosis not present

## 2018-09-01 DIAGNOSIS — M5441 Lumbago with sciatica, right side: Secondary | ICD-10-CM | POA: Diagnosis not present

## 2018-09-01 DIAGNOSIS — Z87891 Personal history of nicotine dependence: Secondary | ICD-10-CM | POA: Diagnosis not present

## 2018-09-01 DIAGNOSIS — M545 Low back pain: Secondary | ICD-10-CM | POA: Diagnosis not present

## 2018-09-01 DIAGNOSIS — R52 Pain, unspecified: Secondary | ICD-10-CM | POA: Diagnosis not present

## 2018-09-01 MED ORDER — PREDNISONE 50 MG PO TABS: 50.0000 mg | ORAL_TABLET | Freq: Every day | ORAL | 0 refills | Status: AC

## 2018-09-01 MED ORDER — METHOCARBAMOL 500 MG PO TABS: 500.0000 mg | ORAL_TABLET | Freq: Two times a day (BID) | ORAL | 0 refills | Status: DC

## 2018-09-01 MED ORDER — MORPHINE SULFATE (PF) 4 MG/ML IV SOLN
4.0000 mg | Freq: Once | INTRAVENOUS | Status: AC
  Administered 2018-09-01: 4 mg via INTRAMUSCULAR
  Filled 2018-09-01: qty 1

## 2018-09-01 MED ORDER — KETOROLAC TROMETHAMINE 30 MG/ML IJ SOLN
30.0000 mg | Freq: Once | INTRAMUSCULAR | Status: AC
  Administered 2018-09-01: 30 mg via INTRAMUSCULAR
  Filled 2018-09-01: qty 1

## 2018-09-01 MED ORDER — NAPROXEN 500 MG PO TABS: 500.0000 mg | ORAL_TABLET | Freq: Two times a day (BID) | ORAL | 0 refills | Status: DC

## 2018-09-01 MED ORDER — METHOCARBAMOL 500 MG PO TABS
1000.0000 mg | ORAL_TABLET | Freq: Once | ORAL | Status: AC
  Administered 2018-09-01: 1000 mg via ORAL
  Filled 2018-09-01: qty 2

## 2018-09-01 MED ORDER — LIDOCAINE 5 % EX PTCH
1.0000 | MEDICATED_PATCH | CUTANEOUS | Status: DC
  Administered 2018-09-01: 1 via TRANSDERMAL
  Filled 2018-09-01: qty 1

## 2018-09-01 MED ORDER — MORPHINE SULFATE (PF) 4 MG/ML IV SOLN: 4.0000 mg | Freq: Once | INTRAVENOUS | Status: DC

## 2018-09-01 MED ORDER — ONDANSETRON 4 MG PO TBDP
4.0000 mg | ORAL_TABLET | Freq: Once | ORAL | Status: AC
  Administered 2018-09-01: 4 mg via ORAL
  Filled 2018-09-01: qty 1

## 2018-09-01 MED ORDER — HYDROCODONE-ACETAMINOPHEN 5-325 MG PO TABS: 1.0000 | ORAL_TABLET | Freq: Four times a day (QID) | ORAL | 0 refills | Status: DC | PRN

## 2018-09-01 MED ORDER — KETOROLAC TROMETHAMINE 30 MG/ML IJ SOLN: 30.0000 mg | Freq: Once | INTRAMUSCULAR | Status: DC

## 2018-09-01 NOTE — ED Notes (Signed)
Pt ambulated in hall without assist

## 2018-09-01 NOTE — ED Provider Notes (Signed)
[facility]-EMERGENCY DEPT Provider Note   CSN: [id number] Arrival date & time: 09/01/18  0741     History   Chief Complaint Chief Complaint  Patient presents with  . Back Pain    HPI Sulayman Manning is a 32 y.o. male.  Brinley Treanor is a 32 y.o. male with a history of asthma, anemia, seasonal allergies and carpal tunnel, who presents to the emergency department for evaluation of severe right lower back pain.  He reports this started last night after he was squatting down to pick up his daughter and he feels that he twisted and this caused severe spasm to his back.  Last night he was able to keep moving around but this morning when he woke up the spasm was significantly worse with pain radiating down his right leg.  He reports he had difficulty standing up to get out of bed and go to the bathroom and had to lay back down.  He took 1 dose of oxycodone that they had at the house which provided some relief patient reports that when he remains in the supine position pain is minimal at a 1-2/10 but when he tries to sit up or stand pain become significantly worse.  He denies any numbness or weakness in his lower extremities.  No loss of bowel or bladder control or saddle anesthesia.  No abdominal pain, no urinary symptoms, no fevers or chills.  No history of cancer or IV drug use.     Past Medical History:  Diagnosis Date  . Allergy   . Anemia   . Asthma   . Carpal tunnel syndrome   . Carpal tunnel syndrome     Patient Active Problem List   Diagnosis Date Noted  . Parasomnia, unspecified 10/19/2017  . Allergic asthma due to European house dust mite 10/19/2017  . Confusional arousals 03/28/2017  . PLMD (periodic limb movement disorder) 11/24/2016  . Morbid obesity with body mass index of 45.0-49.9 in adult (HCC) 11/24/2016  . Hypersomnia with sleep apnea 11/24/2016  . Snoring 11/24/2016  . Asthma exacerbation 10/19/2016  . Attention deficit hyperactivity disorder  (ADHD) 04/29/2016  . Seasonal allergies 09/12/2012  . Asthma 12/26/2011  . Carpal tunnel syndrome 11/11/2011    History reviewed. No pertinent surgical history.      Home Medications    Prior to Admission medications   Medication Sig Start Date End Date Taking? Authorizing Provider  albuterol (VENTOLIN HFA) 108 (90 Base) MCG/ACT inhaler Inhale 2 puffs into the lungs every 4 (four) hours as needed for wheezing or shortness of breath. 12/03/17   Sherren Mocha, MD  amphetamine-dextroamphetamine (ADDERALL) 10 MG tablet Take 1 tablet (10 mg total) by mouth 2 (two) times daily. Patient taking differently: Take 10 mg by mouth daily as needed (focus).  12/03/17   Sherren Mocha, MD  busPIRone (BUSPAR) 10 MG tablet Take 1 tablet (10 mg total) by mouth 2 (two) times daily. May take 3 times day if twice per day ineffective. 08/26/18   Bing Neighbors, FNP  EPINEPHrine (EPI-PEN) 0.3 mg/0.3 mL DEVI Inject 0.3 mLs (0.3 mg total) into the muscle once. 10/17/12   Elvina Sidle, MD  Fluticasone Furoate-Vilanterol 200-25 MCG/INH AEPB Inhale 1 puff into the lungs daily.     [provider]  hydrOXYzine (ATARAX/VISTARIL) 25 MG tablet Take 0.5-1 tablets (12.5-25 mg total) by mouth at bedtime as needed for itching. Patient taking differently: Take 25 mg by mouth at bedtime.  04/27/16  Bing Neighbors, FNP  ibuprofen (ADVIL,MOTRIN) 200 MG tablet Take 600 mg by mouth every 6 (six) hours as needed for headache or moderate pain.     [provider]  levocetirizine (XYZAL) 5 MG tablet Take 5 mg by mouth every evening.    [provider]  montelukast (SINGULAIR) 10 MG tablet Take one daily at bedtime Patient taking differently: Take 10 mg by mouth at bedtime.  10/22/13   Collene Gobble, MD  Multiple Vitamins-Minerals (MULTIVITAMIN WITH MINERALS) tablet Take 1 tablet by mouth daily.    [provider]  Omega-3 Fatty Acids (FISH OIL) 1000 MG CAPS Take 1,000 mg by mouth daily.     [provider]    Family History Family History  Problem Relation Age of Onset  . Drug abuse Mother   . Heart failure Mother   . Arthritis Maternal Grandmother   . Heart disease Maternal Grandfather     Social History Social History   Tobacco Use  . Smoking status: Former Games developer  . Smokeless tobacco: Never Used  . Tobacco comment: has switched to electronic cigarette  Substance Use Topics  . Alcohol use: Yes    Comment: occassional  . Drug use: No     Allergies   Penicillins and Rosemary oil   Review of Systems Review of Systems  Constitutional: Negative for chills and fever.  HENT: Negative.   Respiratory: Negative for shortness of breath.   Cardiovascular: Negative for chest pain.  Gastrointestinal: Negative for abdominal pain, constipation, diarrhea, nausea and vomiting.  Genitourinary: Negative for dysuria, flank pain, frequency and hematuria.  Musculoskeletal: Positive for back pain. Negative for arthralgias, gait problem, joint swelling, myalgias and neck pain.  Skin: Negative for color change, rash and wound.  Neurological: Negative for weakness and numbness.     Physical Exam Updated Vital Signs BP 134/78   Pulse 73   Temp 97.9 F (36.6 C) (Oral)   Resp 18   Ht 5\' 9"  (1.753 m)   Wt (!) 145.2 kg   SpO2 96%   BMI 47.26 kg/m   Physical Exam Vitals signs and nursing note reviewed.  Constitutional:      General: He is not in acute distress.    Appearance: Normal appearance. He is well-developed. He is obese. He is not diaphoretic.  HENT:     Head: Atraumatic.  Eyes:     General:        Right eye: No discharge.        Left eye: No discharge.  Neck:     Musculoskeletal: Neck supple.  Cardiovascular:     Rate and Rhythm: Normal rate and regular rhythm.     Pulses: Normal pulses.          Radial pulses are 2+ on the right side and 2+ on the left side.       Dorsalis pedis pulses are 2+ on the right side and 2+ on the left side.        Posterior tibial pulses are 2+ on the right side and 2+ on the left side.     Heart sounds: Normal heart sounds.  Pulmonary:     Effort: Pulmonary effort is normal. No respiratory distress.     Comments: Respirations equal and unlabored, patient able to speak in full sentences, lungs clear to auscultation bilaterally Abdominal:     General: Bowel sounds are normal. There is no distension.     Palpations: Abdomen is soft. There is no  mass.     Tenderness: There is no abdominal tenderness. There is no guarding.     Comments: Abdomen soft, nondistended, nontender to palpation in all quadrants without guarding or peritoneal signs, no CVA tenderness bilaterally  Musculoskeletal:     Comments: Tenderness to palpation over right lower back.  Pain made worse with range of motion of the lower extremities, positive straight leg raise on the right.  Skin:    General: Skin is warm and dry.     Capillary Refill: Capillary refill takes less than 2 seconds.  Neurological:     General: No focal deficit present.     Mental Status: He is alert and oriented to person, place, and time.     Comments: Alert, clear speech, following commands. Moving all extremities without difficulty. Bilateral lower extremities with 5/5 strength in proximal and distal muscle groups and with dorsi and plantar flexion. Sensation intact in bilateral lower extremities. 2+ patellar DTRs bilaterally. Ambulatory with steady gait  Psychiatric:        Mood and Affect: Mood normal.        Behavior: Behavior normal.      ED Treatments / Results  Labs (all labs ordered are listed, but only abnormal results are displayed) Labs Reviewed - No data to display  EKG None  Radiology Dg Lumbar Spine Complete  Result Date: 09/01/2018 CLINICAL DATA:  Back pain EXAM: LUMBAR SPINE - COMPLETE 4+ VIEW COMPARISON:  None. FINDINGS: There is no evidence of lumbar spine fracture. Alignment is normal. Intervertebral disc spaces are  maintained. IMPRESSION: Negative. Electronically Signed   By: Charlett NoseKevin  Dover M.D.   On: 09/01/2018 11:09    Procedures Procedures (including critical care time)  Medications Ordered in ED Medications  lidocaine (LIDODERM) 5 % 1 patch (1 patch Transdermal Patch Applied 09/01/18 1148)  methocarbamol (ROBAXIN) tablet 1,000 mg (1,000 mg Oral Given 09/01/18 0819)  ondansetron (ZOFRAN-ODT) disintegrating tablet 4 mg (4 mg Oral Given 09/01/18 0819)  ketorolac (TORADOL) 30 MG/ML injection 30 mg (30 mg Intramuscular Given 09/01/18 0820)  morphine 4 MG/ML injection 4 mg (4 mg Intramuscular Given 09/01/18 0819)     Initial Impression / Assessment and Plan / ED Course  I have reviewed the triage vital signs and the nursing notes.  Pertinent labs & imaging results that were available during my care of the patient were reviewed by me and considered in my medical decision making (see chart for details).  Patient with severe right low back pain after lifting his daughter last night.  No neurological deficits and normal neuro exam.  Initially with limited mobility due to pain but this has significantly improved with treatment here in the emergency department.  After pain medication patient was ambulatory with minimal assistance.  No loss of bowel or bladder control.  No concern for cauda equina.  No fever, night sweats, weight loss, h/o cancer, IVDU.  X-ray shows no focal fracture or abnormality.  RICE protocol and pain medicine indicated and discussed with patient.  Patient to follow-up with PCP in 1 week if symptoms not improving.  Return precautions discussed.  Patient expresses understanding and is in agreement with plan.   Final Clinical Impressions(s) / ED Diagnoses   Final diagnoses:  Acute right-sided low back pain with right-sided sciatica    ED Discharge Orders         Ordered    methocarbamol (ROBAXIN) 500 MG tablet  2 times daily     09/01/18 1300    naproxen (  NAPROSYN) 500 MG tablet  2  times daily     09/01/18 1300    predniSONE (DELTASONE) 50 MG tablet  Daily     09/01/18 1300    HYDROcodone-acetaminophen (NORCO) 5-325 MG tablet  Every 6 hours PRN     09/01/18 1300           Dartha Lodge, [place] 09/01/18 1303    Charlynne Pander, MD 09/01/18 [phone]

## 2018-09-01 NOTE — ED Triage Notes (Signed)
Per EMS: Pt was playing with daughter in squatting position and he injured his back.  Pt c/o of pain in right lower back.  Pt unable to tolerate sitting up and standing due to pain.  Pt states laying supine, pain is 1-2/10.

## 2018-09-01 NOTE — ED Notes (Signed)
Bed: [id number] Expected date:  Expected time:  Means of arrival:  Comments: EMS-back pain/injury

## 2018-09-01 NOTE — Discharge Instructions (Addendum)
You were seen here today for Back Pain: Low back pain is discomfort in the lower back that may be due to injuries to muscles and ligaments around the spine. Occasionally, it may be caused by a problem to a part of the spine called a disc. Your back pain should be treated with medicines listed below as well as back exercises and this back pain should get better over the next 2 weeks. Most patients get completely well in 4 weeks. It is important to know however, if you develop severe or worsening pain, low back pain with fever, numbness, weakness or inability to walk or urinate, you should return to the ER immediately.  Please follow up with your doctor this week for a recheck if still having symptoms.  HOME INSTRUCTIONS Self - care:  The application of heat can help soothe the pain.  Maintaining your daily activities, including walking (this is encouraged), as it will help you get better faster than just staying in bed. Do not life, push, pull anything more than 10 pounds for the next week. I am attaching back exercises that you can do at home to help facilitate your recovery.   Back Exercises - I have attached a handout on back exercises that can be done at home to help facilitate your recovery.   Medications are also useful to help with pain control.   Acetaminophen.  This medication is generally safe, and found over the counter. Take as directed for your age. You should not take more than 8 of the extra strength (500mg ) pills a day (max dose is 4000mg  total OVER one day)  Non steroidal anti inflammatory: This includes medications including Ibuprofen, naproxen and Mobic; These medications help both pain and swelling and are very useful in treating back pain.  They should be taken with food, as they can cause stomach upset, and more seriously, stomach bleeding. Do not combine the medications.   Muscle relaxants:  These medications can help with muscle tightness that is a cause of lower back pain.  Most  of these medications can cause drowsiness, and it is not safe to drive or use dangerous machinery while taking them. They are primarily helpful when taken at night before sleep.  Prednisone - This is an oral steroid.  This medication is best taken with food in the morning.  Please note that this medication can cause anxiety, mood swings, muscle fatigue, increased hunger, weight gain (sodium/fluid retention), poor sleep as well as other symptoms. If you are a diabetic, please monitor your blood sugars at home as this medication can increase your blood sugars. Call your pharmacist if you have any questions.  You will need to follow up with your primary healthcare provider  or the Orthopedist in 1-2 weeks for reassessment and persistent symptoms.  Be aware that if you develop new symptoms, such as a fever, leg weakness, difficulty with or loss of control of your urine or bowels, abdominal pain, or more severe pain, you will need to seek medical attention and/or return to the Emergency department. Additional Information:  Your vital signs today were: BP (!) 137/93 (BP Location: Left Arm)    Pulse 83    Temp 98.4 F (36.9 C) (Oral)    Resp 16    Ht 5\' 9"  (1.753 m)    Wt (!) 145.2 kg    SpO2 100%    BMI 47.26 kg/m  If your blood pressure (BP) was elevated above 135/85 this visit, please have this repeated  by your doctor within one month. ---------------

## 2018-09-07 ENCOUNTER — Ambulatory Visit: Payer: BLUE CROSS/BLUE SHIELD | Admitting: Family Medicine

## 2018-09-07 ENCOUNTER — Encounter: Payer: Self-pay | Admitting: Family Medicine

## 2018-09-07 ENCOUNTER — Other Ambulatory Visit: Payer: Self-pay

## 2018-09-07 ENCOUNTER — Telehealth: Payer: Self-pay | Admitting: Family Medicine

## 2018-09-07 VITALS — BP 132/87 | HR 74 | Temp 98.6°F | Ht 69.0 in | Wt 316.2 lb

## 2018-09-07 DIAGNOSIS — S39012A Strain of muscle, fascia and tendon of lower back, initial encounter: Secondary | ICD-10-CM

## 2018-09-07 DIAGNOSIS — M5441 Lumbago with sciatica, right side: Secondary | ICD-10-CM

## 2018-09-07 NOTE — Progress Notes (Signed)
Subjective:    Patient ID: Brian Alvarez, male    DOB: 1986/05/06, 32 y.o.   MRN: [id number]  HPI Brian Alvarez is a 32 y.o. male Presents today for: Chief Complaint  Patient presents with  . Hospitalization Follow-up    back pain, muscle spasm  Brian Alvarez is a new patient to me, here for hospital follow-up.  He was seen on December 13 at [facility], ER for right low back pain starting night previous.  Started after lifting his daughter and felt severe spasm in his back.  Pain radiated down the right leg to the following morning with worse pain.  Took 1 oxycodone at home with some relief.  No apparent neurologic deficits, normal neuro exam.  Was treated in the ER with Toradol injection, morphine injection, methocarbamol, Zofran, lidocaine patch.  X-ray without fracture or acute abnormality.    Since ER visit - getting better, but slowly. Using cane to walk.  No bowel or bladder incontinence, no saddle anesthesia, no lower extremity weakness.  Taking hydrocodone every 5 hours initially, now 1 pill every 6-8 hours 2 pills total per day, only 1 today.  Naproxen and Robaxin twice per day. Did have prednisone initially as well.  Still some shooting pain down r leg if sitting for awhile, or sitting in same position. Worse with reach.  No prior back issues/surgery/PT.  Denies addiction or difficulty with narcotic pain meds. Has some left.  #10 hydrocodone on 12/13 at ER.      Patient Active Problem List   Diagnosis Date Noted  . Parasomnia, unspecified 10/19/2017  . Allergic asthma due to European house dust mite 10/19/2017  . Confusional arousals 03/28/2017  . PLMD (periodic limb movement disorder) 11/24/2016  . Morbid obesity with body mass index of 45.0-49.9 in adult (HCC) 11/24/2016  . Hypersomnia with sleep apnea 11/24/2016  . Snoring 11/24/2016  . Asthma exacerbation 10/19/2016  . Attention deficit hyperactivity disorder (ADHD) 04/29/2016  . Seasonal allergies 09/12/2012  . Asthma  12/26/2011  . Carpal tunnel syndrome 11/11/2011   Past Medical History:  Diagnosis Date  . Allergy   . Anemia   . Asthma   . Carpal tunnel syndrome   . Carpal tunnel syndrome    No past surgical history on file. Allergies  Allergen Reactions  . Penicillins     Hives Has patient had a PCN reaction causing immediate rash, facial/tongue/throat swelling, SOB or lightheadedness with hypotension: yes Has patient had a PCN reaction causing severe rash involving mucus membranes or skin necrosis: yes Has patient had a PCN reaction that required hospitalization: no Has patient had a PCN reaction occurring within the last 10 years: yes If all of the above answers are "NO", then may proceed with Cephalosporin use.  Marland Kitchen Rosemary Oil    Prior to Admission medications   Medication Sig Start Date End Date Taking? Authorizing Provider  albuterol (VENTOLIN HFA) 108 (90 Base) MCG/ACT inhaler Inhale 2 puffs into the lungs every 4 (four) hours as needed for wheezing or shortness of breath. 12/03/17  Yes Sherren Mocha, MD  amphetamine-dextroamphetamine (ADDERALL) 10 MG tablet Take 1 tablet (10 mg total) by mouth 2 (two) times daily. Patient taking differently: Take 10 mg by mouth daily as needed (focus).  12/03/17  Yes Sherren Mocha, MD  EPINEPHrine (EPI-PEN) 0.3 mg/0.3 mL DEVI Inject 0.3 mLs (0.3 mg total) into the muscle once. 10/17/12  Yes Elvina Sidle, MD  HYDROcodone-acetaminophen (NORCO) 5-325 MG tablet Take 1 tablet by  mouth every 6 (six) hours as needed (for breakthrough pain). 09/01/18  Yes Dartha LodgeFord, Kelsey N, PA-C  hydroxypropyl methylcellulose / hypromellose (ISOPTO TEARS / GONIOVISC) 2.5 % ophthalmic solution Place 1 drop into both eyes as needed for dry eyes.   Yes [provider]  hydrOXYzine (ATARAX/VISTARIL) 25 MG tablet Take 0.5-1 tablets (12.5-25 mg total) by mouth at bedtime as needed for itching. Patient taking differently: Take 25 mg by mouth at bedtime.  04/27/16  Yes Bing NeighborsHarris, Kimberly  S, FNP  ibuprofen (ADVIL,MOTRIN) 200 MG tablet Take 600 mg by mouth every 6 (six) hours as needed for headache or moderate pain.    Yes [provider]  levocetirizine (XYZAL) 5 MG tablet Take 5 mg by mouth every evening.   Yes [provider]  lidocaine (LIDODERM) 5 % Place 1 patch onto the skin as needed (muscle pain). Remove & Discard patch within 12 hours or as directed by MD   Yes [provider]  methocarbamol (ROBAXIN) 500 MG tablet Take 1 tablet (500 mg total) by mouth 2 (two) times daily. 09/01/18  Yes Dartha LodgeFord, Kelsey N, PA-C  montelukast (SINGULAIR) 10 MG tablet Take one daily at bedtime Patient taking differently: Take 10 mg by mouth at bedtime.  10/22/13  Yes Collene Gobbleaub, Steven A, MD  Multiple Vitamins-Minerals (MULTIVITAMIN WITH MINERALS) tablet Take 1 tablet by mouth daily.   Yes [provider]  naproxen (NAPROSYN) 500 MG tablet Take 1 tablet (500 mg total) by mouth 2 (two) times daily. 09/01/18  Yes Dartha LodgeFord, Kelsey N, PA-C   Social History   Socioeconomic History  . Marital status: Married    Spouse name: Not on file  . Number of children: Not on file  . Years of education: Not on file  . Highest education level: Not on file  Occupational History  . Not on file  Social Needs  . Financial resource strain: Not on file  . Food insecurity:    Worry: Not on file    Inability: Not on file  . Transportation needs:    Medical: Not on file    Non-medical: Not on file  Tobacco Use  . Smoking status: Former Games developermoker  . Smokeless tobacco: Never Used  . Tobacco comment: has switched to electronic cigarette  Substance and Sexual Activity  . Alcohol use: Yes    Comment: occassional  . Drug use: No  . Sexual activity: Yes  Lifestyle  . Physical activity:    Days per week: Not on file    Minutes per session: Not on file  . Stress: Not on file  Relationships  . Social connections:    Talks on phone: Not on file    Gets together: Not on file    Attends  religious service: Not on file    Active member of club or organization: Not on file    Attends meetings of clubs or organizations: Not on file    Relationship status: Not on file  . Intimate partner violence:    Fear of current or ex partner: Not on file    Emotionally abused: Not on file    Physically abused: Not on file    Forced sexual activity: Not on file  Other Topics Concern  . Not on file  Social History Narrative  . Not on file    Review of Systems Per HPI.     Objective:   Physical Exam Vitals signs reviewed.  Constitutional:      Appearance: He is well-developed.  HENT:     Head: Normocephalic and atraumatic.  Neck:     Musculoskeletal: Normal range of motion.  Pulmonary:     Effort: Pulmonary effort is normal.  Abdominal:     Palpations: Abdomen is soft.     Tenderness: There is no abdominal tenderness.  Musculoskeletal:        General: Tenderness present.     Lumbar back: He exhibits tenderness and spasm. He exhibits normal range of motion and no bony tenderness.  Skin:    General: Skin is warm and dry.  Neurological:     Mental Status: He is alert.     Sensory: No sensory deficit.     Deep Tendon Reflexes:     Reflex Scores:      Patellar reflexes are 2+ on the right side and 2+ on the left side.      Achilles reflexes are 2+ on the right side and 2+ on the left side.    Comments: Able to heel and toe walk without difficulty.  Psychiatric:        Behavior: Behavior normal.   no focal bony ttp.  Pain in back with seated SLR on r, no leg radiation.   Vitals:   09/07/18 1152  BP: 132/87  Pulse: 74  Temp: 98.6 F (37 C)  TempSrc: Oral  SpO2: 95%  Weight: (!) 316 lb 3.2 oz (143.4 kg)  Height: 5\' 9"  (1.753 m)      Assessment & Plan:   Brian LernerJames Alvarez is a 32 y.o. male Strain of lumbar region, initial encounter Acute right-sided low back pain with right-sided sciatica  -Suspected lumbar strain, with some improvement of symptoms.  No red flags on  exam or history.  Plan of goal to taper hydrocodone as pain improves, followed by muscle relaxant, followed by anti-inflammatory.  Refill of Robaxin was given on December 24.  RTC precautions, but recommended recheck in 2 weeks if not resolved  No orders of the defined types were placed in this encounter.  Patient Instructions   I'm glad to hear that back pain has improved.  Ok to continue same plan for now.  Goal of tapering off the hydrocodone and just the Naprosyn and muscle relaxant.  As symptoms continue to improve would cut back on muscle relaxant as well.  Please recheck in the next 2 weeks, sooner if any worsening symptoms.  Thank you for coming in today   Acute Back Pain, Adult Acute back pain is sudden and usually short-lived. It is often caused by an injury to the muscles and tissues in the back. The injury may result from:  A muscle or ligament getting overstretched or torn (strained). Ligaments are tissues that connect bones to each other. Lifting something improperly can cause a back strain.  Wear and tear (degeneration) of the spinal disks. Spinal disks are circular tissue that provides cushioning between the bones of the spine (vertebrae).  Twisting motions, such as while playing sports or doing yard work.  A hit to the back.  Arthritis. You may have a physical exam, lab tests, and imaging tests to find the cause of your pain. Acute back pain usually goes away with rest and home care. Follow these instructions at home: Managing pain, stiffness, and swelling  Take over-the-counter and prescription medicines only as told by your health care provider.  Your health care provider may recommend applying ice during the first 24-48 hours after your pain starts. To do this: ? Put ice in a  plastic bag. ? Place a towel between your skin and the bag. ? Leave the ice on for 20 minutes, 2-3 times a day.  If directed, apply heat to the affected area as often as told by your health  care provider. Use the heat source that your health care provider recommends, such as a moist heat pack or a heating pad. ? Place a towel between your skin and the heat source. ? Leave the heat on for 20-30 minutes. ? Remove the heat if your skin turns bright red. This is especially important if you are unable to feel pain, heat, or cold. You have a greater risk of getting burned. Activity   Do not stay in bed. Staying in bed for more than 1-2 days can delay your recovery.  Sit up and stand up straight. Avoid leaning forward when you sit, or hunching over when you stand. ? If you work at a desk, sit close to it so you do not need to lean over. Keep your chin tucked in. Keep your neck drawn back, and keep your elbows bent at a right angle. Your arms should look like the letter "L." ? Sit high and close to the steering wheel when you drive. Add lower back (lumbar) support to your car seat, if needed.  Take short walks on even surfaces as soon as you are able. Try to increase the length of time you walk each day.  Do not sit, drive, or stand in one place for more than 30 minutes at a time. Sitting or standing for long periods of time can put stress on your back.  Do not drive or use heavy machinery while taking prescription pain medicine.  Use proper lifting techniques. When you bend and lift, use positions that put less stress on your back: ? [place] your knees. ? Keep the load close to your body. ? Avoid twisting.  Exercise regularly as told by your health care provider. Exercising helps your back heal faster and helps prevent back injuries by keeping muscles strong and flexible.  Work with a physical therapist to make a safe exercise program, as recommended by your health care provider. Do any exercises as told by your physical therapist. Lifestyle  Maintain a healthy weight. Extra weight puts stress on your back and makes it difficult to have good posture.  Avoid activities or situations  that make you feel anxious or stressed. Stress and anxiety increase muscle tension and can make back pain worse. Learn ways to manage anxiety and stress, such as through exercise. General instructions  Sleep on a firm mattress in a comfortable position. Try lying on your side with your knees slightly bent. If you lie on your back, put a pillow under your knees.  Follow your treatment plan as told by your health care provider. This may include: ? Cognitive or behavioral therapy. ? Acupuncture or massage therapy. ? Meditation or yoga. Contact a health care provider if:  You have pain that is not relieved with rest or medicine.  You have increasing pain going down into your legs or buttocks.  Your pain does not improve after 2 weeks.  You have pain at night.  You lose weight without trying.  You have a fever or chills. Get help right away if:  You develop new bowel or bladder control problems.  You have unusual weakness or numbness in your arms or legs.  You develop nausea or vomiting.  You develop abdominal pain.  You feel faint. Summary  Acute back pain is sudden and usually short-lived.  Use proper lifting techniques. When you bend and lift, use positions that put less stress on your back.  Take over-the-counter and prescription medicines and apply heat or ice as directed by your health care provider. This information is not intended to replace advice given to you by your health care provider. Make sure you discuss any questions you have with your health care provider. Document Released: 09/06/2005 Document Revised: 04/13/2018 Document Reviewed: 04/20/2017 Elsevier Interactive Patient Education  [employer].   If you have lab work done today you will be contacted with your lab results within the next 2 weeks.  If you have not heard from [place] then please contact us. The fastest way to get your results is to register for My Chart.   IF you received an x-ray today,  you will receive an invoice from [facility]. Please contact [facility] at [phone] with questions or concerns regarding your invoice.   IF you received labwork today, you will receive an invoice from [place]. Please contact [facility] at [phone] with questions or concerns regarding your invoice.   Our billing staff will not be able to assist you with questions regarding bills from these companies.  You will be contacted with the lab results as soon as they are available. The fastest way to get your results is to activate your My Chart account. Instructions are located on the last page of this paperwork. If you have not heard from [place] regarding the results in 2 weeks, please contact this office.       Signed,   Meredith Staggers, MD Primary Care at [facility].  09/12/18 11:43 AM

## 2018-09-07 NOTE — Patient Instructions (Addendum)
I'm glad to hear that back pain has improved.  Ok to continue same plan for now.  Goal of tapering off the hydrocodone and just the Naprosyn and muscle relaxant.  As symptoms continue to improve would cut back on muscle relaxant as well.  Please recheck in the next 2 weeks, sooner if any worsening symptoms.  Thank you for coming in today   Acute Back Pain, Adult Acute back pain is sudden and usually short-lived. It is often caused by an injury to the muscles and tissues in the back. The injury may result from:  A muscle or ligament getting overstretched or torn (strained). Ligaments are tissues that connect bones to each other. Lifting something improperly can cause a back strain.  Wear and tear (degeneration) of the spinal disks. Spinal disks are circular tissue that provides cushioning between the bones of the spine (vertebrae).  Twisting motions, such as while playing sports or doing yard work.  A hit to the back.  Arthritis. You may have a physical exam, lab tests, and imaging tests to find the cause of your pain. Acute back pain usually goes away with rest and home care. Follow these instructions at home: Managing pain, stiffness, and swelling  Take over-the-counter and prescription medicines only as told by your health care provider.  Your health care provider may recommend applying ice during the first 24-48 hours after your pain starts. To do this: ? Put ice in a plastic bag. ? Place a towel between your skin and the bag. ? Leave the ice on for 20 minutes, 2-3 times a day.  If directed, apply heat to the affected area as often as told by your health care provider. Use the heat source that your health care provider recommends, such as a moist heat pack or a heating pad. ? Place a towel between your skin and the heat source. ? Leave the heat on for 20-30 minutes. ? Remove the heat if your skin turns bright red. This is especially important if you are unable to feel pain, heat, or  cold. You have a greater risk of getting burned. Activity   Do not stay in bed. Staying in bed for more than 1-2 days can delay your recovery.  Sit up and stand up straight. Avoid leaning forward when you sit, or hunching over when you stand. ? If you work at a desk, sit close to it so you do not need to lean over. Keep your chin tucked in. Keep your neck drawn back, and keep your elbows bent at a right angle. Your arms should look like the letter "L." ? Sit high and close to the steering wheel when you drive. Add lower back (lumbar) support to your car seat, if needed.  Take short walks on even surfaces as soon as you are able. Try to increase the length of time you walk each day.  Do not sit, drive, or stand in one place for more than 30 minutes at a time. Sitting or standing for long periods of time can put stress on your back.  Do not drive or use heavy machinery while taking prescription pain medicine.  Use proper lifting techniques. When you bend and lift, use positions that put less stress on your back: ? [place] your knees. ? Keep the load close to your body. ? Avoid twisting.  Exercise regularly as told by your health care provider. Exercising helps your back heal faster and helps prevent back injuries by keeping muscles strong and  flexible.  Work with a physical therapist to make a safe exercise program, as recommended by your health care provider. Do any exercises as told by your physical therapist. Lifestyle  Maintain a healthy weight. Extra weight puts stress on your back and makes it difficult to have good posture.  Avoid activities or situations that make you feel anxious or stressed. Stress and anxiety increase muscle tension and can make back pain worse. Learn ways to manage anxiety and stress, such as through exercise. General instructions  Sleep on a firm mattress in a comfortable position. Try lying on your side with your knees slightly bent. If you lie on your back,  put a pillow under your knees.  Follow your treatment plan as told by your health care provider. This may include: ? Cognitive or behavioral therapy. ? Acupuncture or massage therapy. ? Meditation or yoga. Contact a health care provider if:  You have pain that is not relieved with rest or medicine.  You have increasing pain going down into your legs or buttocks.  Your pain does not improve after 2 weeks.  You have pain at night.  You lose weight without trying.  You have a fever or chills. Get help right away if:  You develop new bowel or bladder control problems.  You have unusual weakness or numbness in your arms or legs.  You develop nausea or vomiting.  You develop abdominal pain.  You feel faint. Summary  Acute back pain is sudden and usually short-lived.  Use proper lifting techniques. When you bend and lift, use positions that put less stress on your back.  Take over-the-counter and prescription medicines and apply heat or ice as directed by your health care provider. This information is not intended to replace advice given to you by your health care provider. Make sure you discuss any questions you have with your health care provider. Document Released: 09/06/2005 Document Revised: 04/13/2018 Document Reviewed: 04/20/2017 Elsevier Interactive Patient Education  [employer]2019 [employer]   If you have lab work done today you will be contacted with your lab results within the next 2 weeks.  If you have not heard from us then please contact us. The fastest way to get your results is to register for My Chart.   IF you received an x-ray today, you will receive an invoice from [facility]. Please contact [facility] at [phone] with questions or concerns regarding your invoice.   IF you received labwork today, you will receive an invoice from [facility]. Please contact [facility] at [phone] with questions or concerns regarding your invoice.   Our  billing staff will not be able to assist you with questions regarding bills from these companies.  You will be contacted with the lab results as soon as they are available. The fastest way to get your results is to activate your My Chart account. Instructions are located on the last page of this paperwork. If you have not heard from us regarding the results in 2 weeks, please contact this office.

## 2018-09-07 NOTE — Telephone Encounter (Signed)
Pt was seen today by Neva SeatGreene for back pain wants to follow up with you Dr.Stallings on Sat 10/14/2017 please call pt to schedule appt . With Dr,Stallings ok'd for same day

## 2018-09-11 ENCOUNTER — Encounter: Payer: Self-pay | Admitting: Family Medicine

## 2018-09-12 ENCOUNTER — Encounter: Payer: Self-pay | Admitting: Family Medicine

## 2018-09-12 MED ORDER — METHOCARBAMOL 500 MG PO TABS: 500.0000 mg | ORAL_TABLET | Freq: Two times a day (BID) | ORAL | 0 refills | Status: DC

## 2018-09-12 NOTE — Telephone Encounter (Signed)
See patient message.  Will refill Robaxin temporarily.

## 2018-09-26 ENCOUNTER — Ambulatory Visit: Payer: BLUE CROSS/BLUE SHIELD | Admitting: Family Medicine

## 2018-10-02 ENCOUNTER — Other Ambulatory Visit: Payer: Self-pay

## 2018-10-02 ENCOUNTER — Encounter: Payer: Self-pay | Admitting: Family Medicine

## 2018-10-02 ENCOUNTER — Ambulatory Visit: Payer: BLUE CROSS/BLUE SHIELD | Admitting: Family Medicine

## 2018-10-02 VITALS — BP 138/88 | HR 94 | Temp 99.0°F | Resp 16 | Ht 69.0 in | Wt 315.6 lb

## 2018-10-02 DIAGNOSIS — M5410 Radiculopathy, site unspecified: Secondary | ICD-10-CM | POA: Diagnosis not present

## 2018-10-02 DIAGNOSIS — M5431 Sciatica, right side: Secondary | ICD-10-CM | POA: Diagnosis not present

## 2018-10-02 DIAGNOSIS — R21 Rash and other nonspecific skin eruption: Secondary | ICD-10-CM | POA: Diagnosis not present

## 2018-10-02 MED ORDER — HYDROCODONE-ACETAMINOPHEN 5-325 MG PO TABS: 1.0000 | ORAL_TABLET | Freq: Four times a day (QID) | ORAL | 0 refills | Status: DC | PRN

## 2018-10-02 MED ORDER — CLOTRIMAZOLE 1 % EX CREA: 1.0000 "application " | TOPICAL_CREAM | Freq: Two times a day (BID) | CUTANEOUS | 0 refills | Status: DC

## 2018-10-02 MED ORDER — METHOCARBAMOL 500 MG PO TABS: 500.0000 mg | ORAL_TABLET | Freq: Two times a day (BID) | ORAL | 0 refills | Status: DC

## 2018-10-02 MED ORDER — PREDNISONE 20 MG PO TABS: ORAL_TABLET | ORAL | 0 refills | Status: DC

## 2018-10-02 NOTE — Patient Instructions (Addendum)
Try restarting prednisone for possible herniated disc causing sciatica.  I will also refer you to physical therapy and back specialist.  Robaxin muscle relaxant was refilled as well as hydrocodone if needed for more severe pain, but would not recommend combining those unless absolutely needed.Return to the clinic or go to the nearest emergency room if any of your symptoms worsen or new symptoms occur.  There is some irritation at the upper buttock area.  That sometimes can be due to wet skin and possible fungus.  I wrote for antifungal cream to apply twice per day, but use of a barrier cream such as Desitin can also be helpful.  If that area is worsening return for recheck.  Thank you for coming in today.    Sciatica  Sciatica is pain, numbness, weakness, or tingling along the path of the sciatic nerve. The sciatic nerve starts in the lower back and runs down the back of each leg. The nerve controls the muscles in the lower leg and in the back of the knee. It also provides feeling (sensation) to the back of the thigh, the lower leg, and the sole of the foot. Sciatica is a symptom of another medical condition that pinches or puts pressure on the sciatic nerve. Generally, sciatica only affects one side of the body. Sciatica usually goes away on its own or with treatment. In some cases, sciatica may keep coming back (recur). What are the causes? This condition is caused by pressure on the sciatic nerve, or pinching of the sciatic nerve. This may be the result of:  A disk in between the bones of the spine (vertebrae) bulging out too far (herniated disk).  Age-related changes in the spinal disks (degenerative disk disease).  A pain disorder that affects a muscle in the buttock (piriformis syndrome).  Extra bone growth (bone spur) near the sciatic nerve.  An injury or break (fracture) of the pelvis.  Pregnancy.  Tumor (rare). What increases the risk? The following factors may make you more likely  to develop this condition:  Playing sports that place pressure or stress on the spine, such as football or weight lifting.  Having poor strength and flexibility.  A history of back injury.  A history of back surgery.  Sitting for long periods of time.  Doing activities that involve repetitive bending or lifting.  Obesity. What are the signs or symptoms? Symptoms can vary from mild to very severe, and they may include:  Any of these problems in the lower back, leg, hip, or buttock: ? Mild tingling or dull aches. ? Burning sensations. ? Sharp pains.  Numbness in the back of the calf or the sole of the foot.  Leg weakness.  Severe back pain that makes movement difficult. These symptoms may get worse when you cough, sneeze, or laugh, or when you sit or stand for long periods of time. Being overweight may also make symptoms worse. In some cases, symptoms may recur over time. How is this diagnosed? This condition may be diagnosed based on:  Your symptoms.  A physical exam. Your health care provider may ask you to do certain movements to check whether those movements trigger your symptoms.  You may have tests, including: ? Blood tests. ? X-rays. ? MRI. ? CT scan. How is this treated? In many cases, this condition improves on its own, without any treatment. However, treatment may include:  Reducing or modifying physical activity during periods of pain.  Exercising and stretching to strengthen your abdomen  and improve the flexibility of your spine.  Icing and applying heat to the affected area.  Medicines that help: ? To relieve pain and swelling. ? To relax your muscles.  Injections of medicines that help to relieve pain, irritation, and inflammation around the sciatic nerve (steroids).  Surgery. Follow these instructions at home: Medicines  Take over-the-counter and prescription medicines only as told by your health care provider.  Do not drive or operate heavy  machinery while taking prescription pain medicine. Managing pain  If directed, apply ice to the affected area. ? Put ice in a plastic bag. ? Place a towel between your skin and the bag. ? Leave the ice on for 20 minutes, 2-3 times a day.  After icing, apply heat to the affected area before you exercise or as often as told by your health care provider. Use the heat source that your health care provider recommends, such as a moist heat pack or a heating pad. ? Place a towel between your skin and the heat source. ? Leave the heat on for 20-30 minutes. ? Remove the heat if your skin turns bright red. This is especially important if you are unable to feel pain, heat, or cold. You may have a greater risk of getting burned. Activity  Return to your normal activities as told by your health care provider. Ask your health care provider what activities are safe for you. ? Avoid activities that make your symptoms worse.  Take brief periods of rest throughout the day. Resting in a lying or standing position is usually better than sitting to rest. ? When you rest for longer periods, mix in some mild activity or stretching between periods of rest. This will help to prevent stiffness and pain. ? Avoid sitting for long periods of time without moving. Get up and move around at least one time each hour.  Exercise and stretch regularly, as told by your health care provider.  Do not lift anything that is heavier than 10 lb (4.5 kg) while you have symptoms of sciatica. When you do not have symptoms, you should still avoid heavy lifting, especially repetitive heavy lifting.  When you lift objects, always use proper lifting technique, which includes: ? Bending your knees. ? Keeping the load close to your body. ? Avoiding twisting. General instructions  Use good posture. ? Avoid leaning forward while sitting. ? Avoid hunching over while standing.  Maintain a healthy weight. Excess weight puts extra stress  on your back and makes it difficult to maintain good posture.  Wear supportive, comfortable shoes. Avoid wearing high heels.  Avoid sleeping on a mattress that is too soft or too hard. A mattress that is firm enough to support your back when you sleep may help to reduce your pain.  Keep all follow-up visits as told by your health care provider. This is important. Contact a health care provider if:  You have pain that wakes you up when you are sleeping.  You have pain that gets worse when you lie down.  Your pain is worse than you have experienced in the past.  Your pain lasts longer than 4 weeks.  You experience unexplained weight loss. Get help right away if:  You lose control of your bowel or bladder (incontinence).  You have: ? Weakness in your lower back, pelvis, buttocks, or legs that gets worse. ? Redness or swelling of your back. ? A burning sensation when you urinate. This information is not intended to replace advice given  to you by your health care provider. Make sure you discuss any questions you have with your health care provider. Document Released: 08/31/2001 Document Revised: 02/10/2016 Document Reviewed: 05/16/2015 Elsevier Interactive Patient Education  [employer]2019 [employer]     If you have lab work done today you will be contacted with your lab results within the next 2 weeks.  If you have not heard from us then please contact us. The fastest way to get your results is to register for My Chart.   IF you received an x-ray today, you will receive an invoice from [facility]. Please contact [facility] at [phone] with questions or concerns regarding your invoice.   IF you received labwork today, you will receive an invoice from [facility]. Please contact [facility] at [phone] with questions or concerns regarding your invoice.   Our billing staff will not be able to assist you with questions regarding bills from these companies.  You will  be contacted with the lab results as soon as they are available. The fastest way to get your results is to activate your My Chart account. Instructions are located on the last page of this paperwork. If you have not heard from us regarding the results in 2 weeks, please contact this office.

## 2018-10-02 NOTE — Progress Notes (Signed)
Subjective:    Patient ID: Brian Alvarez, male    DOB: 05/26/1986, 33 y.o.   MRN: [id number]  HPI Brian Alvarez is a 33 y.o. male Presents today for: Chief Complaint  Patient presents with  . Follow-up    was seeen on 09/07/18 for lumbar strain, no better. pain hip down leg and hurt when going from sitting, to standing pain is s 2 but when stand its about a 6 or 7 pain level. Also would like a refill on hydrocodone and robaxin   Here for follow-up visit for lumbar strain.  Seen December 19.  At that time he had had some improvement of symptoms from when he was seen in the ER, which was on December 13.  Date of injury was December 12 when he lifted his daughter and felt a pain in his back with pain rating down the right leg the following morning.  Initial treatment of Toradol, morphine, methocarbamol, Zofran, lidocaine patch.  He was still using hydrocodone when I saw him on the 19th but was at every 6-8 hours during the day only, 1 to 2 pills during the day total.  Naproxen and Robaxin twice per day.  Initially had prednisone as well (50mg  x 5 days starting 12/18).  He was having some intermittent right radiculopathy/leg symptoms at last visit.  Plan for tapering of hydrocodone as pain improves followed by muscle relaxant and anti-inflammatory.  Refill of Robaxin given on 12/24. Marland Kitchen  Feels like robaxin helped more.  XR lumbar spine 12/13 negative.   PDMP review, hydrocodone 5/325 #10 on December 13, matches history.  No recent fill.   Still having some issues with the back. Ran out muscle relaxer end of December.  Had tried to stretch the robaxin out, but worse pain.  Less back pain, now more R buttock to ankle. Sitting to stand worse pain. Has been using cane.  No bowel or bladder incontinence, no saddle anesthesia, no lower extremity weakness., has had a few episodes of having to catch self a few times with pain shooting down R leg.  No rash. Muscle knots at times/cramps in legs, no swelling.    Tx: naprosyn Q8 hr, tylenol.  Today is better than prior days, (alternates - some worse days)  No fever, night sweats, wt. Loss.   Some irritation at upper buttock - sore past few days. May be from trouble wiping with pain.  Tx: diaper rash cream - feels better today.     Patient Active Problem List   Diagnosis Date Noted  . Parasomnia, unspecified 10/19/2017  . Allergic asthma due to European house dust mite 10/19/2017  . Confusional arousals 03/28/2017  . PLMD (periodic limb movement disorder) 11/24/2016  . Morbid obesity with body mass index of 45.0-49.9 in adult (HCC) 11/24/2016  . Hypersomnia with sleep apnea 11/24/2016  . Snoring 11/24/2016  . Asthma exacerbation 10/19/2016  . Attention deficit hyperactivity disorder (ADHD) 04/29/2016  . Seasonal allergies 09/12/2012  . Asthma 12/26/2011  . Carpal tunnel syndrome 11/11/2011   Past Medical History:  Diagnosis Date  . Allergy   . Anemia   . Asthma   . Carpal tunnel syndrome   . Carpal tunnel syndrome    No past surgical history on file. Allergies  Allergen Reactions  . Penicillins     Hives Has patient had a PCN reaction causing immediate rash, facial/tongue/throat swelling, SOB or lightheadedness with hypotension: yes Has patient had a PCN reaction causing severe rash involving mucus membranes or  skin necrosis: yes Has patient had a PCN reaction that required hospitalization: no Has patient had a PCN reaction occurring within the last 10 years: yes If all of the above answers are "NO", then may proceed with Cephalosporin use.  Marland Kitchen. Rosemary Oil    Prior to Admission medications   Medication Sig Start Date End Date Taking? Authorizing Provider  albuterol (VENTOLIN HFA) 108 (90 Base) MCG/ACT inhaler Inhale 2 puffs into the lungs every 4 (four) hours as needed for wheezing or shortness of breath. 12/03/17  Yes Sherren MochaShaw, Eva N, MD  amphetamine-dextroamphetamine (ADDERALL) 10 MG tablet Take 1 tablet (10 mg total) by mouth 2  (two) times daily. Patient taking differently: Take 10 mg by mouth daily as needed (focus).  12/03/17  Yes Sherren MochaShaw, Eva N, MD  EPINEPHrine (EPI-PEN) 0.3 mg/0.3 mL DEVI Inject 0.3 mLs (0.3 mg total) into the muscle once. 10/17/12  Yes Elvina SidleLauenstein, Kurt, MD  HYDROcodone-acetaminophen (NORCO) 5-325 MG tablet Take 1 tablet by mouth every 6 (six) hours as needed (for breakthrough pain). 09/01/18  Yes Dartha LodgeFord, Kelsey N, PA-C  hydrOXYzine (ATARAX/VISTARIL) 25 MG tablet Take 0.5-1 tablets (12.5-25 mg total) by mouth at bedtime as needed for itching. Patient taking differently: Take 25 mg by mouth at bedtime.  04/27/16  Yes Bing NeighborsHarris, Kimberly S, FNP  ibuprofen (ADVIL,MOTRIN) 200 MG tablet Take 600 mg by mouth every 6 (six) hours as needed for headache or moderate pain.    Yes [provider]  levocetirizine (XYZAL) 5 MG tablet Take 5 mg by mouth every evening.   Yes [provider]  methocarbamol (ROBAXIN) 500 MG tablet Take 1 tablet (500 mg total) by mouth 2 (two) times daily. 09/12/18  Yes Shade FloodGreene, Genevia Bouldin R, MD  montelukast (SINGULAIR) 10 MG tablet Take one daily at bedtime Patient taking differently: Take 10 mg by mouth at bedtime.  10/22/13  Yes Collene Gobbleaub, Steven A, MD  Multiple Vitamins-Minerals (MULTIVITAMIN WITH MINERALS) tablet Take 1 tablet by mouth daily.   Yes [provider]  naproxen (NAPROSYN) 500 MG tablet Take 1 tablet (500 mg total) by mouth 2 (two) times daily. 09/01/18  Yes Dartha LodgeFord, Kelsey N, PA-C   Social History   Socioeconomic History  . Marital status: Married    Spouse name: Not on file  . Number of children: Not on file  . Years of education: Not on file  . Highest education level: Not on file  Occupational History  . Not on file  Social Needs  . Financial resource strain: Not on file  . Food insecurity:    Worry: Not on file    Inability: Not on file  . Transportation needs:    Medical: Not on file    Non-medical: Not on file  Tobacco Use  . Smoking status:  Former Games developermoker  . Smokeless tobacco: Never Used  . Tobacco comment: has switched to electronic cigarette  Substance and Sexual Activity  . Alcohol use: Yes    Comment: occassional  . Drug use: No  . Sexual activity: Yes  Lifestyle  . Physical activity:    Days per week: Not on file    Minutes per session: Not on file  . Stress: Not on file  Relationships  . Social connections:    Talks on phone: Not on file    Gets together: Not on file    Attends religious service: Not on file    Active member of club or organization: Not on file    Attends meetings of clubs  or organizations: Not on file    Relationship status: Not on file  . Intimate partner violence:    Fear of current or ex partner: Not on file    Emotionally abused: Not on file    Physically abused: Not on file    Forced sexual activity: Not on file  Other Topics Concern  . Not on file  Social History Narrative  . Not on file    Review of Systems     Objective:   Physical Exam Constitutional:      Appearance: He is well-developed.  HENT:     Head: Normocephalic and atraumatic.  Neck:     Musculoskeletal: Normal range of motion.  Pulmonary:     Effort: Pulmonary effort is normal.  Abdominal:     Palpations: Abdomen is soft.     Tenderness: There is no abdominal tenderness.  Musculoskeletal:        General: No tenderness.     Lumbar back: He exhibits spasm. He exhibits normal range of motion and no bony tenderness.       Arms:  Skin:    General: Skin is warm and dry.       Neurological:     Mental Status: He is alert.     Sensory: No sensory deficit.     Deep Tendon Reflexes:     Reflex Scores:      Patellar reflexes are 2+ on the right side and 2+ on the left side.      Achilles reflexes are 2+ on the right side and 2+ on the left side.    Comments: Able to heel and toe walk without difficulty.  Psychiatric:        Behavior: Behavior normal.   positive SLR on right.  Vitals:   10/02/18 1417  10/02/18 1425  BP: (!) 149/97 138/88  Pulse: 94   Resp: 16   Temp: 99 F (37.2 C)   TempSrc: Oral   SpO2: 98%   Weight: (!) 315 lb 9.6 oz (143.2 kg)   Height: 5\' 9"  (1.753 m)        Assessment & Plan:    Brian Alvarez is a 33 y.o. male Right sciatic nerve pain - Plan: predniSONE (DELTASONE) 20 MG tablet, HYDROcodone-acetaminophen (NORCO/VICODIN) 5-325 MG tablet, methocarbamol (ROBAXIN) 500 MG tablet, Ambulatory referral to Spine Surgery, Ambulatory referral to Physical Therapy Radicular leg pain - Plan: Ambulatory referral to Spine Surgery, Ambulatory referral to Physical Therapy  -Suspected muscle strain/low back pain with right radicular leg symptoms, suspected sciatica.  Still may have component of herniated disc.  Some improvement but relapsing/remitting.  No red flags on exam or history  -Refer to back specialist, physical therapy, repeat prednisone taper with potential side effects and risk discussed, hydrocodone and Robaxin refilled if needed, cautioned on side effects and additive side effects (recommended 1 or the other initially)  -RTC/ER precautions if worsening  Rash and nonspecific skin eruption - Plan: clotrimazole (LOTRIMIN) 1 % cream  -Irritant dermatitis possible versus fungal given the location and wet appearance.  -Barrier cream such as Desitin over-the-counter is fine, but can try clotrimazole twice per day for now for fungal treatment.  RTC precautions  Meds ordered this encounter  Medications  . predniSONE (DELTASONE) 20 MG tablet    Sig: 3 by mouth for 3 days, then 2 by mouth for 2 days, then 1 by mouth for 2 days, then 1/2 by mouth for 2 days.    Dispense:  16 tablet  Refill:  0  . HYDROcodone-acetaminophen (NORCO/VICODIN) 5-325 MG tablet    Sig: Take 1 tablet by mouth every 6 (six) hours as needed for moderate pain.    Dispense:  15 tablet    Refill:  0  . methocarbamol (ROBAXIN) 500 MG tablet    Sig: Take 1 tablet (500 mg total) by mouth 2 (two) times  daily.    Dispense:  15 tablet    Refill:  0  . clotrimazole (LOTRIMIN) 1 % cream    Sig: Apply 1 application topically 2 (two) times daily.    Dispense:  30 g    Refill:  0   Patient Instructions   Try restarting prednisone for possible herniated disc causing sciatica.  I will also refer you to physical therapy and back specialist.  Robaxin muscle relaxant was refilled as well as hydrocodone if needed for more severe pain, but would not recommend combining those unless absolutely needed.Return to the clinic or go to the nearest emergency room if any of your symptoms worsen or new symptoms occur.  There is some irritation at the upper buttock area.  That sometimes can be due to wet skin and possible fungus.  I wrote for antifungal cream to apply twice per day, but use of a barrier cream such as Desitin can also be helpful.  If that area is worsening return for recheck.  Thank you for coming in today.    Sciatica  Sciatica is pain, numbness, weakness, or tingling along the path of the sciatic nerve. The sciatic nerve starts in the lower back and runs down the back of each leg. The nerve controls the muscles in the lower leg and in the back of the knee. It also provides feeling (sensation) to the back of the thigh, the lower leg, and the sole of the foot. Sciatica is a symptom of another medical condition that pinches or puts pressure on the sciatic nerve. Generally, sciatica only affects one side of the body. Sciatica usually goes away on its own or with treatment. In some cases, sciatica may keep coming back (recur). What are the causes? This condition is caused by pressure on the sciatic nerve, or pinching of the sciatic nerve. This may be the result of:  A disk in between the bones of the spine (vertebrae) bulging out too far (herniated disk).  Age-related changes in the spinal disks (degenerative disk disease).  A pain disorder that affects a muscle in the buttock (piriformis  syndrome).  Extra bone growth (bone spur) near the sciatic nerve.  An injury or break (fracture) of the pelvis.  Pregnancy.  Tumor (rare). What increases the risk? The following factors may make you more likely to develop this condition:  Playing sports that place pressure or stress on the spine, such as football or weight lifting.  Having poor strength and flexibility.  A history of back injury.  A history of back surgery.  Sitting for long periods of time.  Doing activities that involve repetitive bending or lifting.  Obesity. What are the signs or symptoms? Symptoms can vary from mild to very severe, and they may include:  Any of these problems in the lower back, leg, hip, or buttock: ? Mild tingling or dull aches. ? Burning sensations. ? Sharp pains.  Numbness in the back of the calf or the sole of the foot.  Leg weakness.  Severe back pain that makes movement difficult. These symptoms may get worse when you cough, sneeze, or laugh, or  when you sit or stand for long periods of time. Being overweight may also make symptoms worse. In some cases, symptoms may recur over time. How is this diagnosed? This condition may be diagnosed based on:  Your symptoms.  A physical exam. Your health care provider may ask you to do certain movements to check whether those movements trigger your symptoms.  You may have tests, including: ? Blood tests. ? X-rays. ? MRI. ? CT scan. How is this treated? In many cases, this condition improves on its own, without any treatment. However, treatment may include:  Reducing or modifying physical activity during periods of pain.  Exercising and stretching to strengthen your abdomen and improve the flexibility of your spine.  Icing and applying heat to the affected area.  Medicines that help: ? To relieve pain and swelling. ? To relax your muscles.  Injections of medicines that help to relieve pain, irritation, and inflammation  around the sciatic nerve (steroids).  Surgery. Follow these instructions at home: Medicines  Take over-the-counter and prescription medicines only as told by your health care provider.  Do not drive or operate heavy machinery while taking prescription pain medicine. Managing pain  If directed, apply ice to the affected area. ? Put ice in a plastic bag. ? Place a towel between your skin and the bag. ? Leave the ice on for 20 minutes, 2-3 times a day.  After icing, apply heat to the affected area before you exercise or as often as told by your health care provider. Use the heat source that your health care provider recommends, such as a moist heat pack or a heating pad. ? Place a towel between your skin and the heat source. ? Leave the heat on for 20-30 minutes. ? Remove the heat if your skin turns bright red. This is especially important if you are unable to feel pain, heat, or cold. You may have a greater risk of getting burned. Activity  Return to your normal activities as told by your health care provider. Ask your health care provider what activities are safe for you. ? Avoid activities that make your symptoms worse.  Take brief periods of rest throughout the day. Resting in a lying or standing position is usually better than sitting to rest. ? When you rest for longer periods, mix in some mild activity or stretching between periods of rest. This will help to prevent stiffness and pain. ? Avoid sitting for long periods of time without moving. Get up and move around at least one time each hour.  Exercise and stretch regularly, as told by your health care provider.  Do not lift anything that is heavier than 10 lb (4.5 kg) while you have symptoms of sciatica. When you do not have symptoms, you should still avoid heavy lifting, especially repetitive heavy lifting.  When you lift objects, always use proper lifting technique, which includes: ? Bending your knees. ? Keeping the load  close to your body. ? Avoiding twisting. General instructions  Use good posture. ? Avoid leaning forward while sitting. ? Avoid hunching over while standing.  Maintain a healthy weight. Excess weight puts extra stress on your back and makes it difficult to maintain good posture.  Wear supportive, comfortable shoes. Avoid wearing high heels.  Avoid sleeping on a mattress that is too soft or too hard. A mattress that is firm enough to support your back when you sleep may help to reduce your pain.  Keep all follow-up visits as told by  your health care provider. This is important. Contact a health care provider if:  You have pain that wakes you up when you are sleeping.  You have pain that gets worse when you lie down.  Your pain is worse than you have experienced in the past.  Your pain lasts longer than 4 weeks.  You experience unexplained weight loss. Get help right away if:  You lose control of your bowel or bladder (incontinence).  You have: ? Weakness in your lower back, pelvis, buttocks, or legs that gets worse. ? Redness or swelling of your back. ? A burning sensation when you urinate. This information is not intended to replace advice given to you by your health care provider. Make sure you discuss any questions you have with your health care provider. Document Released: 08/31/2001 Document Revised: 02/10/2016 Document Reviewed: 05/16/2015 Elsevier Interactive Patient Education  [employer].     If you have lab work done today you will be contacted with your lab results within the next 2 weeks.  If you have not heard from [place] then please contact us. The fastest way to get your results is to register for My Chart.   IF you received an x-ray today, you will receive an invoice from [facility]. Please contact [facility] at [phone] with questions or concerns regarding your invoice.   IF you received labwork today, you will receive an  invoice from [place]. Please contact [facility] at [phone] with questions or concerns regarding your invoice.   Our billing staff will not be able to assist you with questions regarding bills from these companies.  You will be contacted with the lab results as soon as they are available. The fastest way to get your results is to activate your My Chart account. Instructions are located on the last page of this paperwork. If you have not heard from [place] regarding the results in 2 weeks, please contact this office.        Signed,   Meredith Staggers, MD Primary Care at [facility].  10/03/18 1:38 PM

## 2018-10-03 ENCOUNTER — Encounter: Payer: Self-pay | Admitting: Family Medicine

## 2018-10-09 ENCOUNTER — Other Ambulatory Visit: Payer: Self-pay | Admitting: Family Medicine

## 2018-10-09 ENCOUNTER — Encounter: Payer: Self-pay | Admitting: Family Medicine

## 2018-10-09 DIAGNOSIS — M5431 Sciatica, right side: Secondary | ICD-10-CM

## 2018-10-09 NOTE — Telephone Encounter (Signed)
Please Advise  Patient is requesting a refill of the following medications: Requested Prescriptions   Pending Prescriptions Disp Refills  . methocarbamol (ROBAXIN) 500 MG tablet [Pharmacy Med Name: METHOCARBAMOL 500MG  TABLETS] 15 tablet 0    Sig: TAKE 1 TABLET(500 MG) BY MOUTH TWICE DAILY

## 2018-10-09 NOTE — Telephone Encounter (Signed)
Requested medication (s) are due for refill today: yes  Requested medication (s) are on the active medication list: yes  Last refill:  10/02/2018  Future visit scheduled: yes  Notes to clinic:  Med can not be delegated.   Requested Prescriptions  Pending Prescriptions Disp Refills   methocarbamol (ROBAXIN) 500 MG tablet [Pharmacy Med Name: METHOCARBAMOL 500MG  TABLETS] 15 tablet 0    Sig: TAKE 1 TABLET(500 MG) BY MOUTH TWICE DAILY     Not Delegated - Analgesics:  Muscle Relaxants Failed - 10/09/2018  4:36 PM      Failed - This refill cannot be delegated      Passed - Valid encounter within last 6 months    Recent Outpatient Visits          1 week ago Right sciatic nerve pain   [facility] at [facility], Asencion Partridge, MD   1 month ago Strain of lumbar region, initial encounter   [facility] at [facility], Asencion Partridge, MD   1 month ago Need for prophylactic vaccination and inoculation against influenza   [facility] at [facility], Godfrey Pick, FNP   8 months ago Serotonin withdrawal syndrome, initial encounter   [facility], Dema Severin, PA-C   9 months ago Attention deficit hyperactivity disorder (ADHD), predominantly inattentive type   [facility] at [facility], Manus Rudd, MD      Future Appointments            In 1 week Sherren Mocha, MD [facility] at [place], [facility]

## 2018-10-10 ENCOUNTER — Other Ambulatory Visit: Payer: Self-pay | Admitting: Family Medicine

## 2018-10-10 DIAGNOSIS — M5431 Sciatica, right side: Secondary | ICD-10-CM

## 2018-10-10 MED ORDER — HYDROCODONE-ACETAMINOPHEN 5-325 MG PO TABS: 1.0000 | ORAL_TABLET | Freq: Four times a day (QID) | ORAL | 0 refills | Status: DC | PRN

## 2018-10-10 NOTE — Telephone Encounter (Signed)
Patient is requesting a refill of the following medications: Requested Prescriptions   Pending Prescriptions Disp Refills  . HYDROcodone-acetaminophen (NORCO/VICODIN) 5-325 MG tablet 15 tablet 0    Sig: Take 1 tablet by mouth every 6 (six) hours as needed for moderate pain.    Date of patient request: 10/09/2018 Last office visit: 10/02/2018 Date of last refill: 10/02/2018 Last refill amount: #15 Follow up time period per chart: see my chart message pt is requesting stronger pain med.

## 2018-10-10 NOTE — Progress Notes (Signed)
See my chart message

## 2018-10-11 ENCOUNTER — Ambulatory Visit: Payer: BLUE CROSS/BLUE SHIELD | Attending: Family Medicine | Admitting: Physical Therapy

## 2018-10-11 ENCOUNTER — Encounter: Payer: Self-pay | Admitting: Physical Therapy

## 2018-10-11 DIAGNOSIS — M5441 Lumbago with sciatica, right side: Secondary | ICD-10-CM | POA: Diagnosis not present

## 2018-10-11 NOTE — Patient Instructions (Signed)
Access Code: [email or web address]  URL: [email or web address]  Date: 10/11/2018  Prepared by: Ivery Quale   Exercises  Supine Piriformis Stretch - 3 reps - 1 sets - 30 hold - 2x daily - 6x weekly  Supine Hamstring Stretch with Strap - 3 reps - 1 sets - 30 hold - 2x daily - 6x weekly  Standing Lumbar Extension - 10 reps - 1-2 sets - 5 hold - 2x daily - 6x weekly  Prone Press Up - 10 reps - 3 sets - 2x daily - 6x weekly  Supine Bridge - 10 reps - 2 sets - 5 hold - 2x daily - 6x weekly

## 2018-10-11 NOTE — Therapy (Signed)
[facility] [address] [place], [place], [address] Phone: [phone]   Fax:  [phone]  Physical Therapy Evaluation  Patient Details  Name: Brian LernerJames Alvarez MRN: [id number] Date of Birth: October 25, 1985 Referring Provider (PT): Shade FloodGreene, Jeffrey R, MD   Encounter Date: 10/11/2018  PT End of Session - 10/11/18 1043    Visit Number  1    Number of Visits  12    Date for PT Re-Evaluation  11/22/18    Authorization Type  BCBS    PT Start Time  0930    PT Stop Time  1020    PT Time Calculation (min)  50 min    Activity Tolerance  Patient tolerated treatment well    Behavior During Therapy  [facility] for tasks assessed/performed       Past Medical History:  Diagnosis Date  . Allergy   . Anemia   . Asthma   . Carpal tunnel syndrome   . Carpal tunnel syndrome     History reviewed. No pertinent surgical history.  There were no vitals filed for this visit.   Subjective Assessment - 10/11/18 0942    Subjective  Pt relays Rt sciatica, pain started 08/31/28 after squatted down to pick up daughter. Has become a little better but still having Rt radiculopathy.     Pertinent History  PMH: asthma, anemia, CTS    Limitations  Sitting;Walking;House hold activities    How long can you sit comfortably?  depends    How long can you stand comfortably?  hour    How long can you walk comfortably?  can walk grocery store    Diagnostic tests  XR neg    Patient Stated Goals  get back to normal    Currently in Pain?  Yes    Pain Score  5     Pain Location  Back   Back and Rt leg   Pain Orientation  Right    Pain Descriptors / Indicators  Sharp;Burning    Pain Type  Acute pain    Pain Radiating Towards  Rt leg down to heel    Pain Onset  1 to 4 weeks ago    Pain Frequency  Intermittent    Aggravating Factors   bending fwd, sitting, sidebending to Rt,     Pain Relieving Factors  standing, laying on stomach, laying on back with legs elevated,  sometimes heat and medss    Multiple Pain Sites  No         [facility] PT Assessment - 10/11/18 0001      Assessment   Medical Diagnosis  Rt sciatica    Referring Provider (PT)  Shade FloodGreene, Jeffrey R, MD    Onset Date/Surgical Date  09/17/18   onset of pain   Next MD Visit  --   not scheduled with Neva SeatGreene, does meet with spine MD 10/17/18   Prior Therapy  none      Precautions   Precautions  None      Restrictions   Other Position/Activity Restrictions  not to lift over 10 lbs      Balance Screen   Has the patient fallen in the past 6 months  No   but close due to Rt leg giving out     Home Environment   Living Environment  Private residence    Additional Comments  has steps but can negotiated without difficulty.       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    Leisure  video games      Cognition   Overall Cognitive Status  Within Functional Limits for tasks assessed      Observation/Other Assessments   Focus on Therapeutic Outcomes (FOTO)   55% limited      Coordination   Gross Motor Movements are Fluid and Coordinated  Yes      Posture/Postural Control   Posture Comments  slumped posture      ROM / Strength   AROM / PROM / Strength  AROM;PROM;Strength      AROM   AROM Assessment Site  Lumbar    Lumbar Flexion  50%    Lumbar Extension  75%    Lumbar - Right Side Bend  50%    Lumbar - Left Side Bend  75%    Lumbar - Right Rotation  75%    Lumbar - Left Rotation  75%      Strength   Overall Strength Comments  Rt leg 4+/5 MMT, Lt leg 5/5 MMT grossly tested in sitting      Flexibility   Soft Tissue Assessment /Muscle Length  --   HS 40 deg Rt, 65 deg LT     Palpation   Palpation comment  no TTP reported in lumbar spine, some pain in glutes and posterior lateral Rt leg      Special Tests    Special Tests   Lumbar    Other special tests  + SLR bilat Rt worse than Lt, some centralization with repeated extension    Lumbar Tests  Slump Test      Slump test   Findings  Positive    Side  Right      Ambulation/Gait   Gait Comments  has been ambulating with SPC in Lt UE to unload Rt leg, walks with good gait pattern with this, hopes to get off cane soon                Objective measurements completed on examination: See above findings.      [facility] Adult PT Treatment/Exercise - 10/11/18 0001      Exercises   Exercises  Lumbar      Lumbar Exercises: Stretches   Other Lumbar Stretch Exercise  standing repeated lumbar ext 2X10, prone press ups 2X10 (some centralization with this)             PT Education - 10/11/18 1042    Education Details  exam findings, extension based program and HEP, POC    Person(s) Educated  Patient    Methods  Explanation;Demonstration;Verbal cues;Handout    Comprehension  Verbalized understanding;Need further instruction          PT Long Term Goals - 10/11/18 1050      PT LONG TERM GOAL #1   Title  Pt will be I and compliant with HEP. (6 weeks 11/22/18)    Status  New      PT LONG TERM GOAL #2   Title  Pt will improve FOTO to less than 36% limited. (6 weeks 11/22/18)    Status  New      PT LONG TERM GOAL #3   Title  Pt will improve lumbar ROM to WNL. (6 weeks 11/22/18)    Status  New      PT LONG  TERM GOAL #4   Title  Pt will improve Rt Leg strength to 5/5 MMT tested in sitting and report no radiculopathy in Rt leg. (6 weeks 11/22/18)    Status  New      PT LONG TERM GOAL #5   Title  Pt will report less than 2/10 overall pain with activity and have good undstanding of how to manage his pain on his own.     Status  New             Plan - 10/11/18 1045    Clinical Impression Statement  Pt presents with Rt LBP with sciatica. He has radiculopathy down to his Rt heel and Rt weakness and is now having to ambulate with SPC. He did have  some centrlization with repeated lumbar ext today so PT is suspecting disc pathology. He will benefit from skilled PT to address his defefits in lumbar ROM, Rt leg strength, pain and spasm.     History and Personal Factors relevant to plan of care:  PMH: asthma, anemia, CTS    Clinical Presentation  Evolving    Clinical Presentation due to:  inonsistent pain and radiculopathy since Dec 2019    Clinical Decision Making  Moderate    Rehab Potential  Good    Clinical Impairments Affecting Rehab Potential  good response to repeated extension today    PT Frequency  2x / week    PT Duration  6 weeks    PT Treatment/Interventions  Electrical Stimulation;Iontophoresis 4mg /ml Dexamethasone;Moist Heat;Traction;Ultrasound;Therapeutic activities;Therapeutic exercise;Neuromuscular re-education;Manual techniques;Passive range of motion;Dry needling;Taping;Spinal Manipulations;Joint Manipulations    PT Next Visit Plan  favored extension based program, add slump stretch/LE nerve glide, consider tranction, modalties for pain, lumbar sretching and core    PT Home Exercise Plan  standing lumbar ext, prone press ups, piriformis and hamstring stretch, bridge    Consulted and Agree with Plan of Care  Patient       Patient will benefit from skilled therapeutic intervention in order to improve the following deficits and impairments:  Abnormal gait, Decreased activity tolerance, Decreased endurance, Decreased range of motion, Decreased strength, Hypomobility, Obesity, Impaired flexibility, Increased muscle spasms, Pain  Visit Diagnosis: Acute right-sided low back pain with right-sided sciatica     Problem List Patient Active Problem List   Diagnosis Date Noted  . Parasomnia, unspecified 10/19/2017  . Allergic asthma due to European house dust mite 10/19/2017  . Confusional arousals 03/28/2017  . PLMD (periodic limb movement disorder) 11/24/2016  . Morbid obesity with body mass index of 45.0-49.9 in adult (HCC)  11/24/2016  . Hypersomnia with sleep apnea 11/24/2016  . Snoring 11/24/2016  . Asthma exacerbation 10/19/2016  . Attention deficit hyperactivity disorder (ADHD) 04/29/2016  . Seasonal allergies 09/12/2012  . Asthma 12/26/2011  . Carpal tunnel syndrome 11/11/2011    April MansonBrian R Nelson ,PT,DPT 10/11/2018, 11:07 AM  [facility] [address] [place], [place], [address] Phone: [phone]   Fax:  [phone]  Name: Brian LernerJames Alvarez MRN: [id number] Date of Birth: [birth year]

## 2018-10-12 ENCOUNTER — Telehealth: Payer: Self-pay | Admitting: Family Medicine

## 2018-10-12 NOTE — Telephone Encounter (Signed)
Called and spoke with pt regarding appt with Dr. Clelia Croft on 10/17/18. Pt stated that he no longer needs the appt due to him being able to be seen earlier and has been all taken care of. I advised pt to call if he needs [place]. Pt acknowledged.

## 2018-10-17 ENCOUNTER — Ambulatory Visit: Payer: BLUE CROSS/BLUE SHIELD | Admitting: Family Medicine

## 2018-10-17 DIAGNOSIS — I10 Essential (primary) hypertension: Secondary | ICD-10-CM | POA: Diagnosis not present

## 2018-10-17 DIAGNOSIS — M5416 Radiculopathy, lumbar region: Secondary | ICD-10-CM | POA: Diagnosis not present

## 2018-10-17 DIAGNOSIS — M5441 Lumbago with sciatica, right side: Secondary | ICD-10-CM | POA: Diagnosis not present

## 2018-10-17 DIAGNOSIS — Z6841 Body Mass Index (BMI) 40.0 and over, adult: Secondary | ICD-10-CM | POA: Diagnosis not present

## 2018-10-18 ENCOUNTER — Ambulatory Visit: Payer: BLUE CROSS/BLUE SHIELD | Admitting: Physical Therapy

## 2018-10-18 DIAGNOSIS — M5441 Lumbago with sciatica, right side: Secondary | ICD-10-CM | POA: Diagnosis not present

## 2018-10-18 NOTE — Therapy (Signed)
[facility] [address] [place], [place], [address] Phone: [phone]   Fax:  [phone]  Physical Therapy Treatment  Patient Details  Name: Brian LernerJames Gloeckner MRN: [id number] Date of Birth: 1985-11-29 Referring Provider (PT): Shade FloodGreene, Jeffrey R, MD   Encounter Date: 10/18/2018  PT End of Session - 10/18/18 1035    Visit Number  2    Number of Visits  12    Date for PT Re-Evaluation  11/22/18    Authorization Type  BCBS    PT Start Time  [phone]   pt late   PT Stop Time  1020    PT Time Calculation (min)  42 min    Activity Tolerance  Patient tolerated treatment well;Patient limited by pain    Behavior During Therapy  [facility] for tasks assessed/performed       Past Medical History:  Diagnosis Date  . Allergy   . Anemia   . Asthma   . Carpal tunnel syndrome   . Carpal tunnel syndrome     No past surgical history on file.  There were no vitals filed for this visit.  Subjective Assessment - 10/18/18 0945    Subjective  Pt relays he feels PT is helping some, less pain overal but still in pain and pain is worse with sit to stand and getting out of car    Pertinent History  PMH: asthma, anemia, CTS    Limitations  Sitting;Walking;House hold activities    How long can you sit comfortably?  depends    How long can you stand comfortably?  hour    How long can you walk comfortably?  can walk grocery store    Diagnostic tests  XR neg    Patient Stated Goals  get back to normal    Currently in Pain?  Yes    Pain Score  4     Pain Location  Back    Pain Orientation  Right    Pain Descriptors / Indicators  Sharp;Burning    Pain Type  Acute pain    Pain Radiating Towards  Rt leg    Pain Onset  1 to 4 weeks ago    Pain Frequency  Intermittent    Multiple Pain Sites  No                       [facility] Adult PT Treatment/Exercise - 10/18/18 0001      Lumbar Exercises: Stretches   Active Hamstring Stretch  Right;Left;3  reps;10 seconds    Active Hamstring Stretch Limitations  supine with strap    Prone on Elbows Stretch Limitations  2 min    Press Ups  2 reps;10 reps    Press Ups Limitations  with exhale (sag) at top    Piriformis Stretch  Right;Left;2 reps;30 seconds    Other Lumbar Stretch Exercise  standing repeated lumbar ext 2X10    Other Lumbar Stretch Exercise  seated slump stretch 3 sec X 10 reps on Rt      Lumbar Exercises: Aerobic   Recumbent Bike  5 min no resistance      Manual Therapy   Manual therapy comments  trialed manual traction, he relays this felt good at the time but when eased off caused Rt leg numbness down to foot and this was relieved some with standing lumbar ext), trialed deep tissue work to piriformis and lumbar mobs central PA grade III with only slight relief in symptoms  PT Education - 10/18/18 1033    Education Details  added slump stretch into HEP    Person(s) Educated  Patient    Methods  Explanation;Verbal cues;Demonstration    Comprehension  Verbalized understanding;Returned demonstration          PT Long Term Goals - 10/11/18 1050      PT LONG TERM GOAL #1   Title  Pt will be I and compliant with HEP. (6 weeks 11/22/18)    Status  New      PT LONG TERM GOAL #2   Title  Pt will improve FOTO to less than 36% limited. (6 weeks 11/22/18)    Status  New      PT LONG TERM GOAL #3   Title  Pt will improve lumbar ROM to WNL. (6 weeks 11/22/18)    Status  New      PT LONG TERM GOAL #4   Title  Pt will improve Rt Leg strength to 5/5 MMT tested in sitting and report no radiculopathy in Rt leg. (6 weeks 11/22/18)    Status  New      PT LONG TERM GOAL #5   Title  Pt will report less than 2/10 overall pain with activity and have good undstanding of how to manage his pain on his own.     Status  New            Plan - 10/18/18 1036    Clinical Impression Statement  HEP was reivewed with him and he showed good return demonstraton and  understaning and slump stretch was added today in efforts to reduce Rt leg neural tension. Session focused no repeated lumbar extension as this appears to give him the most relief however Rt leg radiculopathy is still present and is better at times and worse at times. He did see Neurosurgeon yesterday who recommended MRI and he is waiting to get this scheduled. PT will continue until with current POC in the meantime as he would like to avoid surgery at all cost but due to his inconsistent radiculopathy he may potentially need surgery if PT does not get him to a satisfactory level.     Rehab Potential  Good    Clinical Impairments Affecting Rehab Potential  now only fair response with his radiculopathy    PT Frequency  2x / week    PT Duration  6 weeks    PT Treatment/Interventions  Electrical Stimulation;Iontophoresis 4mg /ml Dexamethasone;Moist Heat;Traction;Ultrasound;Therapeutic activities;Therapeutic exercise;Neuromuscular re-education;Manual techniques;Passive range of motion;Dry needling;Taping;Spinal Manipulations;Joint Manipulations    PT Next Visit Plan  favored extension based program,consider mechanical traction, modalties for pain, lumbar sretching and core    PT Home Exercise Plan  added slump stretch and continued standing lumbar ext, prone press ups, piriformis and hamstring stretch, bridge    Consulted and Agree with Plan of Care  Patient       Patient will benefit from skilled therapeutic intervention in order to improve the following deficits and impairments:  Abnormal gait, Decreased activity tolerance, Decreased endurance, Decreased range of motion, Decreased strength, Hypomobility, Obesity, Impaired flexibility, Increased muscle spasms, Pain  Visit Diagnosis: Acute right-sided low back pain with right-sided sciatica     Problem List Patient Active Problem List   Diagnosis Date Noted  . Parasomnia, unspecified 10/19/2017  . Allergic asthma due to European house dust mite  10/19/2017  . Confusional arousals 03/28/2017  . PLMD (periodic limb movement disorder) 11/24/2016  . Morbid obesity with body mass index of 45.0-49.9  in adult [facility]) 11/24/2016  . Hypersomnia with sleep apnea 11/24/2016  . Snoring 11/24/2016  . Asthma exacerbation 10/19/2016  . Attention deficit hyperactivity disorder (ADHD) 04/29/2016  . Seasonal allergies 09/12/2012  . Asthma 12/26/2011  . Carpal tunnel syndrome 11/11/2011    Birdie Riddle 10/18/2018, 10:43 AM  [facility] [address] [place], [place], [address] Phone: [phone]   Fax:  [phone]  Name: Davaun Najera MRN: [id number] Date of Birth: Feb 04, 1986

## 2018-10-19 ENCOUNTER — Ambulatory Visit: Payer: BLUE CROSS/BLUE SHIELD | Admitting: Physical Therapy

## 2018-10-23 ENCOUNTER — Encounter: Payer: Self-pay | Admitting: Family Medicine

## 2018-10-23 DIAGNOSIS — M5431 Sciatica, right side: Secondary | ICD-10-CM

## 2018-10-24 ENCOUNTER — Encounter: Payer: Self-pay | Admitting: Physical Therapy

## 2018-10-24 ENCOUNTER — Ambulatory Visit: Payer: BLUE CROSS/BLUE SHIELD | Attending: Family Medicine | Admitting: Physical Therapy

## 2018-10-24 DIAGNOSIS — M5441 Lumbago with sciatica, right side: Secondary | ICD-10-CM | POA: Diagnosis not present

## 2018-10-24 NOTE — Therapy (Signed)
[facility] [address] [place], [place], [address] Phone: [phone]   Fax:  [phone]  Physical Therapy Treatment  Patient Details  Name: Brian Alvarez MRN: [id number] Date of Birth: 1986-08-20 Referring Provider (PT): Shade FloodGreene, Jeffrey R, MD   Encounter Date: 10/24/2018  PT End of Session - 10/24/18 1346    Visit Number  3    Number of Visits  12    Date for PT Re-Evaluation  11/22/18    Authorization Type  BCBS    PT Start Time  0940    PT Stop Time  1020    PT Time Calculation (min)  40 min    Activity Tolerance  Patient tolerated treatment well    Behavior During Therapy  [facility] CenterWFL for tasks assessed/performed       Past Medical History:  Diagnosis Date  . Allergy   . Anemia   . Asthma   . Carpal tunnel syndrome   . Carpal tunnel syndrome     History reviewed. No pertinent surgical history.  There were no vitals filed for this visit.  Subjective Assessment - 10/24/18 0944    Subjective  Has been having more pain.  out of pain meds since last wednesday .  Pain in back with over exerting  the day before.   ( once every 3-4 days)    Currently in Pain?  Yes    Pain Score  4     Pain Location  Back    Pain Orientation  Right    Pain Radiating Towards     leg    Pain Frequency  Constant   leg constant,  back intermittant   Aggravating Factors    sit to stand  stand to sit     Pain Relieving Factors  pillows in bed.  prone hyper extened,  medication.  Uses hands to relieve stresses with activities.    side bend trunk to left.     Effect of Pain on Daily Activities  Limits  ADL    Multiple Pain Sites  No                       [facility] Adult PT Treatment/Exercise - 10/24/18 0001      Self-Care   Self-Care  ADL's    discussed referred pain,  pain increases often with less      Lumbar Exercises: Stretches   Active Hamstring Stretch  Right;Left;3 reps;10 seconds    Active Hamstring Stretch Limitations   supine with strap    Prone on Elbows Stretch Limitations  2 min    Press Ups  10 reps    Press Ups Limitations  2 sets,  exhale    Piriformis Stretch  2 reps    Other Lumbar Stretch Exercise  Prone hip IR/ES 3 x 20 seconds.  piriformis.   PROM      Lumbar Exercises: Seated   Other Seated Lumbar Exercises  scoot to side for sit to stand and swing hips to side with standing to avoid pain increase.  patient able to do  wilth less pain      Lumbar Exercises: Supine   Bridge  10 reps    Bridge Limitations  cues initially  good height.       Lumbar Exercises: Prone   Other Prone Lumbar Exercises  multifitus press 6 X   mod cued initially,  isolation,  hip press into table     Manual Therapy  Manual therapy comments  tender to palpation.              PT Education - 10/24/18 1345    Education Details  self care,  HEP    Person(s) Educated  Patient    Methods  Explanation;Tactile cues;Verbal cues;Handout    Comprehension  Returned demonstration;Verbalized understanding          PT Long Term Goals - 10/11/18 1050      PT LONG TERM GOAL #1   Title  Pt will be I and compliant with HEP. (6 weeks 11/22/18)    Status  New      PT LONG TERM GOAL #2   Title  Pt will improve FOTO to less than 36% limited. (6 weeks 11/22/18)    Status  New      PT LONG TERM GOAL #3   Title  Pt will improve lumbar ROM to WNL. (6 weeks 11/22/18)    Status  New      PT LONG TERM GOAL #4   Title  Pt will improve Rt Leg strength to 5/5 MMT tested in sitting and report no radiculopathy in Rt leg. (6 weeks 11/22/18)    Status  New      PT LONG TERM GOAL #5   Title  Pt will report less than 2/10 overall pain with activity and have good undstanding of how to manage his pain on his own.     Status  New            Plan - 10/24/18 1346    Clinical Impression Statement  Patient open to education about pain.  He was able to reduce pain  with modified sit to stand technique of swinging hips to the side.   ROM  in hip rotation improved with stretching prone.  Working toward goals with  exercise.     PT Next Visit Plan  favored extension based program,consider mechanical traction, modalties for pain, lumbar sretching and core.  Answer any ADL questions.       PT Home Exercise Plan  added slump stretch and continued standing lumbar ext, prone press ups, piriformis and hamstring stretch, bridge    Consulted and Agree with Plan of Care  Patient       Patient will benefit from skilled therapeutic intervention in order to improve the following deficits and impairments:     Visit Diagnosis: Acute right-sided low back pain with right-sided sciatica     Problem List Patient Active Problem List   Diagnosis Date Noted  . Parasomnia, unspecified 10/19/2017  . Allergic asthma due to European house dust mite 10/19/2017  . Confusional arousals 03/28/2017  . PLMD (periodic limb movement disorder) 11/24/2016  . Morbid obesity with body mass index of 45.0-49.9 in adult (HCC) 11/24/2016  . Hypersomnia with sleep apnea 11/24/2016  . Snoring 11/24/2016  . Asthma exacerbation 10/19/2016  . Attention deficit hyperactivity disorder (ADHD) 04/29/2016  . Seasonal allergies 09/12/2012  . Asthma 12/26/2011  . Carpal tunnel syndrome 11/11/2011    Graelyn Bihl PTA 10/24/2018, 1:49 PM  [facility] [address] [place], [place], [address] Phone: [phone]   Fax:  [phone]  Name: Brian Alvarez MRN: [id number] Date of Birth: Jan 24, 1986

## 2018-10-24 NOTE — Patient Instructions (Signed)
Bridge    Lie back, legs bent. Inhale, pressing hips up.  Exhale with lower.  Repeat _10___ times. Do __1 -2__ sessions per day.  http://pm.exer.us/54   Copyright  VHI. All rights reserved.  Sleeping on Back  Place pillow under knees. A pillow with cervical support and a roll around waist are also helpful. Copyright  VHI. All rights reserved.  Sleeping on Side Place pillow between knees. Use cervical support under neck and a roll around waist as needed. Copyright  VHI. All rights reserved.   Sleeping on Stomach   If this is the only desirable sleeping position, place pillow under lower legs, and under stomach or chest as needed.  Posture - Sitting   Sit upright, head facing forward. Try using a roll to support lower back. Keep shoulders relaxed, and avoid rounded back. Keep hips level with knees. Avoid crossing legs for long periods. Stand to Sit / Sit to Stand   To sit: Bend knees to lower self onto front edge of chair, then scoot back on seat. To stand: Reverse sequence by placing one foot forward, and scoot to front of seat. Use rocking motion to stand up.   Work Height and Reach  Ideal work height is no more than 2 to 4 inches below elbow level when standing, and at elbow level when sitting. Reaching should be limited to arm's length, with elbows slightly bent.  Bending  Bend at hips and knees, not back. Keep feet shoulder-width apart.    Posture - Standing   Good posture is important. Avoid slouching and forward head thrust. Maintain curve in low back and align ears over shoul- ders, hips over ankles.  Alternating Positions   Alternate tasks and change positions frequently to reduce fatigue and muscle tension. Take rest breaks. Computer Work   Position work to Art gallery managerface forward. Use proper work and seat height. Keep shoulders back and down, wrists straight, and elbows at right angles. Use chair that provides full back support. Add footrest and lumbar roll as  needed.  Getting Into / Out of Car  Lower self onto seat, scoot back, then bring in one leg at a time. Reverse sequence to get out.  Dressing  Lie on back to pull socks or slacks over feet, or sit and bend leg while keeping back straight.    Housework - Sink  Place one foot on ledge of cabinet under sink when standing at sink for prolonged periods.   Pushing / Pulling  Pushing is preferable to pulling. Keep back in proper alignment, and use leg muscles to do the work.  Deep Squat   Squat and lift with both arms held against upper trunk. Tighten stomach muscles without holding breath. Use smooth movements to avoid jerking.  Avoid Twisting   Avoid twisting or bending back. Pivot around using foot movements, and bend at knees if needed when reaching for articles.  Carrying Luggage   Distribute weight evenly on both sides. Use a cart whenever possible. Do not twist trunk. Move body as a unit.   Lifting Principles .Maintain proper posture and head alignment. .Slide object as close as possible before lifting. .Move obstacles out of the way. .Test before lifting; ask for help if too heavy. .Tighten stomach muscles without holding breath. .Use smooth movements; do not jerk. .Use legs to do the work, and pivot with feet. .Distribute the work load symmetrically and close to the center of trunk. .Push instead of pull whenever possible.   Ask For Help  Ask for help and delegate to others when possible. Coordinate your movements when lifting together, and maintain the low back curve.  Log Roll   Lying on back, bend left knee and place left arm across chest. Roll all in one movement to the right. Reverse to roll to the left. Always move as one unit. Housework - Sweeping  Use long-handled equipment to avoid stooping.   Housework - Wiping  Position yourself as close as possible to reach work surface. Avoid straining your back.  Laundry - Unloading Wash   To unload  small items at bottom of washer, lift leg opposite to arm being used to reach.  Gardening - Raking  Move close to area to be raked. Use arm movements to do the work. Keep back straight and avoid twisting.     Cart  When reaching into cart with one arm, lift opposite leg to keep back straight.   Getting Into / Out of Bed  Lower self to lie down on one side by raising legs and lowering head at the same time. Use arms to assist moving without twisting. Bend both knees to roll onto back if desired. To sit up, start from lying on side, and use same move-ments in reverse. Housework - Vacuuming  Hold the vacuum with arm held at side. Step back and forth to move it, keeping head up. Avoid twisting.   Laundry - Armed forces training and education officer so that bending and twisting can be avoided.   Laundry - Unloading Dryer  Squat down to reach into clothes dryer or use a reacher.  Gardening - Weeding / Psychiatric nurse or Kneel. Knee pads may be helpful.

## 2018-10-25 MED ORDER — HYDROCODONE-ACETAMINOPHEN 5-325 MG PO TABS: 1.0000 | ORAL_TABLET | Freq: Four times a day (QID) | ORAL | 0 refills | Status: DC | PRN

## 2018-10-25 NOTE — Telephone Encounter (Signed)
Will refill once, but will need to know plan after MRI to decide on further refills vs. pain management eval. message sent to patient.  Controlled substance database (PDMP) reviewed. No concerns appreciated. Last Rx # 20 of hydrocodone on 10/10/18

## 2018-10-25 NOTE — Telephone Encounter (Signed)
Pt called to f/u on request for hydrocodone. He said that neurosurgeon advised him to contact PCP for refill. Please advise on refilling medication.   HYDROcodone-acetaminophen (NORCO/VICODIN) 5-325 MG tablet  [id number] - [place], [place] - [address] AT [facility] OF [facility] [address] [phone] (Phone) [phone] (Fax)

## 2018-10-26 DIAGNOSIS — Z135 Encounter for screening for eye and ear disorders: Secondary | ICD-10-CM | POA: Diagnosis not present

## 2018-10-27 ENCOUNTER — Ambulatory Visit: Payer: BLUE CROSS/BLUE SHIELD | Admitting: Physical Therapy

## 2018-10-27 ENCOUNTER — Other Ambulatory Visit (HOSPITAL_COMMUNITY): Payer: Self-pay | Admitting: Neurosurgery

## 2018-10-27 DIAGNOSIS — M5441 Lumbago with sciatica, right side: Secondary | ICD-10-CM

## 2018-10-31 ENCOUNTER — Ambulatory Visit: Payer: BLUE CROSS/BLUE SHIELD | Admitting: Physical Therapy

## 2018-10-31 ENCOUNTER — Encounter: Payer: Self-pay | Admitting: Physical Therapy

## 2018-10-31 DIAGNOSIS — M5441 Lumbago with sciatica, right side: Secondary | ICD-10-CM

## 2018-10-31 NOTE — Therapy (Signed)
[facility] [address] [place], [place], [address] Phone: [phone]   Fax:  [phone]  Physical Therapy Treatment  Patient Details  Name: [name] MRN: [id number] Date of Birth: 05-Jul-1986 Referring Provider (PT): Shade Flood, MD   Encounter Date: 10/31/2018  PT End of Session - 10/31/18 1312    Visit Number  4    Number of Visits  12    Date for PT Re-Evaluation  11/22/18    Authorization Type  BCBS    PT Start Time  [phone]    PT Stop Time  1040    PT Time Calculation (min)  56 min    Activity Tolerance  Patient tolerated treatment well    Behavior During Therapy  [facility] for tasks assessed/performed       Past Medical History:  Diagnosis Date  . Allergy   . Anemia   . Asthma   . Carpal tunnel syndrome   . Carpal tunnel syndrome     History reviewed. No pertinent surgical history.  There were no vitals filed for this visit.  Subjective Assessment - 10/31/18 0945    Subjective  I has a good day yesterday.  I might have been doing too much.  I am doing the body mechanics.  I have been doing the HEP.  Varies from 1 to 6/10.     Currently in Pain?  Yes    Pain Score  3    varies    Pain Location  Back    Pain Orientation  Right    Pain Descriptors / Indicators  Sharp   wave of pain.     Pain Type  Acute pain    Pain Radiating Towards  leg sensitive anterior, posterior, lateral right      Pain Frequency  Intermittent    Aggravating Factors   getting out of car,  low chairs,  car    Pain Relieving Factors  scoot to side for sit to stand.                        [facility] Adult PT Treatment/Exercise - 10/31/18 0001      Self-Care   Self-Care  --   standing posture with foot prop.  demo discussed advantages.     Lumbar Exercises: Stretches   Passive Hamstring Stretch  2 reps;30 seconds    Passive Hamstring Stretch Limitations  decreased ROM,  ankle pumps right did not  change ,  flossing  did not change.      Lower Trunk Rotation  5 reps    Lower Trunk Rotation Limitations  feels good stretching  right    Prone on Elbows Stretch Limitations  1 minute    Press Ups  10 reps    Piriformis Stretch  2 reps;20 seconds    Figure 4 Stretch  1 rep;30 seconds      Lumbar Exercises: Supine   Ab Set  10 reps    AB Set Limitations  with arms pressing into mat    Bridge  10 reps    Bridge Limitations  good height      Lumbar Exercises: Prone   Straight Leg Raise  10 reps    Straight Leg Raises Limitations   feels right not as high,  however it is higher    Other Prone Lumbar Exercises  multifitus press 10 X,  press and hold with knee flexion  5 X each leg  Traction   Type of Traction  Lumbar    Min (lbs)  40    Max (lbs)  120   100 too mild   Hold Time  60    Rest Time  20    Time  10             PT Education - 10/31/18 1311    Education Details  traction info,  what to expect,      Person(s) Educated  Patient    Methods  Explanation    Comprehension  Verbalized understanding          PT Long Term Goals - 10/31/18 1336      PT LONG TERM GOAL #1   Title  Pt will be I and compliant with HEP. (6 weeks 11/22/18)    Baseline  says he is compliant    Period  Weeks    Status  On-going      PT LONG TERM GOAL #2   Title  Pt will improve FOTO to less than 36% limited. (6 weeks 11/22/18)    Period  Weeks    Status  Unable to assess      PT LONG TERM GOAL #3   Title  Pt will improve lumbar ROM to WNL. (6 weeks 11/22/18)    Period  Weeks    Status  Unable to assess      PT LONG TERM GOAL #4   Title  Pt will improve Rt Leg strength to 5/5 MMT tested in sitting and report no radiculopathy in Rt leg. (6 weeks 11/22/18)    Baseline  strength not tested.  Radiculopathy intermittant    Period  Weeks    Status  On-going      PT LONG TERM GOAL #5   Title  Pt will report less than 2/10 overall pain with activity and have good undstanding of how to manage his pain on  his own.     Baseline  0 to 6/10 with activity    Period  Weeks    Status  On-going            Plan - 10/31/18 1333    Clinical Impression Statement  Parient arrived late, however I had an opening  to accomodate.  Traction helpful with radiating pain and ease of movement.      PT Next Visit Plan  extension based exercise and stretching.  Continue mechanical traction.  Progress HEP as able.    PT Home Exercise Plan  added slump stretch and continued standing lumbar ext, prone press ups, piriformis and hamstring stretch, bridge    Consulted and Agree with Plan of Care  Patient       Patient will benefit from skilled therapeutic intervention in order to improve the following deficits and impairments:     Visit Diagnosis: Acute right-sided low back pain with right-sided sciatica     Problem List Patient Active Problem List   Diagnosis Date Noted  . Parasomnia, unspecified 10/19/2017  . Allergic asthma due to European house dust mite 10/19/2017  . Confusional arousals 03/28/2017  . PLMD (periodic limb movement disorder) 11/24/2016  . Morbid obesity with body mass index of 45.0-49.9 in adult (HCC) 11/24/2016  . Hypersomnia with sleep apnea 11/24/2016  . Snoring 11/24/2016  . Asthma exacerbation 10/19/2016  . Attention deficit hyperactivity disorder (ADHD) 04/29/2016  . Seasonal allergies 09/12/2012  . Asthma 12/26/2011  . Carpal tunnel syndrome 11/11/2011    Yoko Mcgahee  PTA 10/31/2018,  1:40 PM  [facility] [address] [place], [place], [address] Phone: [phone]   Fax:  [phone]  Name: Brian Alvarez MRN: [id number] Date of Birth: [birth year]

## 2018-11-02 ENCOUNTER — Other Ambulatory Visit: Payer: Self-pay | Admitting: Neurosurgery

## 2018-11-02 DIAGNOSIS — M5441 Lumbago with sciatica, right side: Secondary | ICD-10-CM

## 2018-11-03 ENCOUNTER — Ambulatory Visit: Payer: BLUE CROSS/BLUE SHIELD | Admitting: Physical Therapy

## 2018-11-03 ENCOUNTER — Other Ambulatory Visit (HOSPITAL_COMMUNITY): Payer: Self-pay | Admitting: Neurosurgery

## 2018-11-03 ENCOUNTER — Encounter: Payer: Self-pay | Admitting: Family Medicine

## 2018-11-03 DIAGNOSIS — M5441 Lumbago with sciatica, right side: Secondary | ICD-10-CM | POA: Diagnosis not present

## 2018-11-03 DIAGNOSIS — M5431 Sciatica, right side: Secondary | ICD-10-CM

## 2018-11-03 NOTE — Therapy (Addendum)
[place], [place], [address] Phone: [phone]   Fax:  [phone]  Physical Therapy Treatment/Discharge addendum PHYSICAL THERAPY DISCHARGE SUMMARY  Visits from Start of Care: 5  Current functional level related to goals / functional outcomes: See below   Remaining deficits: See below   Education / Equipment: HEP  Plan: Patient agrees to discharge.  Patient goals were not met. Patient is being discharged due to not returning since the last visit.  ?????     Elsie Ra, PT, DPT 06/28/19 10:26 AM   Patient Details  Name: Brian Alvarez MRN: [id number] Date of Birth: [birth year] Referring Provider (PT): Wendie Agreste, MD   Encounter Date: 11/03/2018  PT End of Session - 11/03/18 1005    Visit Number  5    Number of Visits  12    Date for PT Re-Evaluation  11/22/18    Authorization Type  BCBS    PT Start Time  0940    PT Stop Time  1028   last 10 min on ice   PT Time Calculation (min)  48 min    Activity Tolerance  Patient tolerated treatment well;Patient limited by pain    Behavior During Therapy  [facility] for tasks assessed/performed       Past Medical History:  Diagnosis Date  . Allergy   . Anemia   . Asthma   . Carpal tunnel syndrome   . Carpal tunnel syndrome     No past surgical history on file.  There were no vitals filed for this visit.  Subjective Assessment - 11/03/18 0957    Subjective  Pt relays the traction felt good at the time but has been having more pain the last 2 days so does not want to try traction again.    Currently in Pain?  Yes    Pain Score  5     Pain Location  Back    Pain Orientation  Right    Pain Descriptors / Indicators  Aching;Sharp    Pain Type  Acute pain    Pain Radiating Towards  Rt leg                       [facility] Adult PT Treatment/Exercise - 11/03/18 0001      Lumbar Exercises: Stretches   Passive Hamstring Stretch  2  reps;30 seconds    Lower Trunk Rotation Limitations  10 reps bilat hold 5 sec    Prone on Elbows Stretch Limitations  3 min    Press Ups  15 reps    Piriformis Stretch  2 reps;20 seconds    Figure 4 Stretch  1 rep;30 seconds      Lumbar Exercises: Supine   Bridge  10 reps    Bridge Limitations  with knee ext alt bilat      Lumbar Exercises: Prone   Straight Leg Raise  15 reps      Lumbar Exercises: Quadruped   Madcat/Old Horse  10 reps    Opposite Arm/Leg Raise  --      Modalities   Modalities  Cryotherapy      Cryotherapy   Number Minutes Cryotherapy  10 Minutes    Cryotherapy Location  Lumbar Spine    Type of Cryotherapy  Ice pack      Manual Therapy   Manual therapy comments  lumbar rotation PROM in sidelying and rotation mobilizations grade 3-4 with cavitation  PT Long Term Goals - 10/31/18 1336      PT LONG TERM GOAL #1   Title  Pt will be I and compliant with HEP. (6 weeks 11/22/18)    Baseline  says he is compliant    Period  Weeks    Status  On-going      PT LONG TERM GOAL #2   Title  Pt will improve FOTO to less than 36% limited. (6 weeks 11/22/18)    Period  Weeks    Status  Unable to assess      PT LONG TERM GOAL #3   Title  Pt will improve lumbar ROM to WNL. (6 weeks 11/22/18)    Period  Weeks    Status  Unable to assess      PT LONG TERM GOAL #4   Title  Pt will improve Rt Leg strength to 5/5 MMT tested in sitting and report no radiculopathy in Rt leg. (6 weeks 11/22/18)    Baseline  strength not tested.  Radiculopathy intermittant    Period  Weeks    Status  On-going      PT LONG TERM GOAL #5   Title  Pt will report less than 2/10 overall pain with activity and have good undstanding of how to manage his pain on his own.     Baseline  0 to 6/10 with activity    Period  Weeks    Status  On-going            Plan - 11/03/18 1023    Clinical Impression Statement  Pt relays he went to have MRI but did not fit in the  mobile MRI machine so he has to get this rescheduled for a large MRI machine. Session focused on extension based exercises, lumar stretching, and progression of core stabilizaiton. Lumbar rotational mobs performed with cavitation today which did help relieve some pain but he continues to have radicular symptoms and is making inconsitent progress. He relays he might wait until he has MRI before he returns    Rehab Potential  Good    Clinical Impairments Affecting Rehab Potential  now only fair and inconsistent response with his radiculopathy    PT Frequency  2x / week    PT Duration  6 weeks    PT Treatment/Interventions  Electrical Stimulation;Iontophoresis 67m/ml Dexamethasone;Moist Heat;Traction;Ultrasound;Therapeutic activities;Therapeutic exercise;Neuromuscular re-education;Manual techniques;Passive range of motion;Dry needling;Taping;Spinal Manipulations;Joint Manipulations    PT Next Visit Plan  he may wait until he gets MRI to return, extension based exercise and stretching.  MT,  Progress HEP as able.    PT Home Exercise Plan  added slump stretch and continued standing lumbar ext, prone press ups, piriformis and hamstring stretch, bridge    Consulted and Agree with Plan of Care  Patient       Patient will benefit from skilled therapeutic intervention in order to improve the following deficits and impairments:  Abnormal gait, Decreased activity tolerance, Decreased endurance, Decreased range of motion, Decreased strength, Hypomobility, Obesity, Impaired flexibility, Increased muscle spasms, Pain  Visit Diagnosis: Acute right-sided low back pain with right-sided sciatica     Problem List Patient Active Problem List   Diagnosis Date Noted  . Parasomnia, unspecified 10/19/2017  . Allergic asthma due to European house dust mite 10/19/2017  . Confusional arousals 03/28/2017  . PLMD (periodic limb movement disorder) 11/24/2016  . Morbid obesity with body mass index of 45.0-49.9 in adult  ([facility] 11/24/2016  . Hypersomnia with sleep apnea 11/24/2016  .  Snoring 11/24/2016  . Asthma exacerbation 10/19/2016  . Attention deficit hyperactivity disorder (ADHD) 04/29/2016  . Seasonal allergies 09/12/2012  . Asthma 12/26/2011  . Carpal tunnel syndrome 11/11/2011    Silvestre Mesi 11/03/2018, 10:39 AM  [facility] [address] [place], [place], [address] Phone: [phone]   Fax:  [phone]  Name: Kym Fenter MRN: [id number] Date of Birth: [birth year]

## 2018-11-06 MED ORDER — HYDROCODONE-ACETAMINOPHEN 5-325 MG PO TABS: 1.0000 | ORAL_TABLET | Freq: Four times a day (QID) | ORAL | 0 refills | Status: DC | PRN

## 2018-11-06 NOTE — Telephone Encounter (Signed)
Controlled substance database (PDMP) reviewed. No concerns appreciated.  Last Rx #20 of hydrocodone on 11/03/18.

## 2018-11-07 ENCOUNTER — Ambulatory Visit: Payer: BLUE CROSS/BLUE SHIELD | Admitting: Physical Therapy

## 2018-11-07 ENCOUNTER — Telehealth: Payer: Self-pay | Admitting: Physical Therapy

## 2018-11-07 NOTE — Telephone Encounter (Signed)
Automated voicemail message left about missed visit today.  Date and time of next visit give.  Our phone number was given for patient to call if he is unable to attend or if he is no longer interested in PT.   Liz Beach PTA

## 2018-11-10 ENCOUNTER — Ambulatory Visit: Payer: BLUE CROSS/BLUE SHIELD | Admitting: Physical Therapy

## 2018-11-10 ENCOUNTER — Telehealth: Payer: Self-pay | Admitting: Physical Therapy

## 2018-11-10 NOTE — Telephone Encounter (Signed)
Pt no show for PT appointment today. They where contacted and left message to touch base with them as they are getting MRI and PT spoke with him last visit about maybe waiting until MRI but can still return to PT if he feels PT is helping his back. Also left on message that the has 2 more appointments next week and PT needs to know if he wants to keep these or cancel these.   Ivery Quale, PT, DPT 11/10/18 11:34 AM

## 2018-11-12 ENCOUNTER — Encounter: Payer: Self-pay | Admitting: Family Medicine

## 2018-11-14 ENCOUNTER — Ambulatory Visit: Payer: BLUE CROSS/BLUE SHIELD | Admitting: Physical Therapy

## 2018-11-16 ENCOUNTER — Ambulatory Visit: Payer: BLUE CROSS/BLUE SHIELD | Admitting: Family Medicine

## 2018-11-16 ENCOUNTER — Telehealth: Payer: Self-pay | Admitting: Family Medicine

## 2018-11-16 NOTE — Telephone Encounter (Signed)
Copied from CRM [id number]. Topic: Quick Communication - Appointment Cancellation >> Nov 16, 2018 12:33 PM Trula Slade wrote: Patient called to cancel appointment scheduled for 11/16/2018. Patient has not rescheduled their appointment. Patient is having back problems, and he can't get in and out of the car right now.  He will call back to reschedule.  Route to department's PEC pool.

## 2018-11-17 ENCOUNTER — Ambulatory Visit: Payer: BLUE CROSS/BLUE SHIELD | Admitting: Physical Therapy

## 2018-11-21 ENCOUNTER — Encounter: Payer: BLUE CROSS/BLUE SHIELD | Admitting: Physical Therapy

## 2018-11-23 ENCOUNTER — Ambulatory Visit (HOSPITAL_COMMUNITY)
Admission: RE | Admit: 2018-11-23 | Discharge: 2018-11-23 | Disposition: A | Discharge status: Home / Self Care | Payer: BLUE CROSS/BLUE SHIELD | Source: Ambulatory Visit | Attending: Neurosurgery | Admitting: Neurosurgery

## 2018-11-23 DIAGNOSIS — M5441 Lumbago with sciatica, right side: Secondary | ICD-10-CM

## 2018-11-23 DIAGNOSIS — M5126 Other intervertebral disc displacement, lumbar region: Secondary | ICD-10-CM | POA: Diagnosis not present

## 2018-11-23 DIAGNOSIS — M48061 Spinal stenosis, lumbar region without neurogenic claudication: Secondary | ICD-10-CM | POA: Diagnosis not present

## 2018-11-24 ENCOUNTER — Encounter: Payer: BLUE CROSS/BLUE SHIELD | Admitting: Physical Therapy

## 2018-11-27 ENCOUNTER — Other Ambulatory Visit (HOSPITAL_COMMUNITY): Payer: Self-pay | Admitting: Student

## 2018-11-27 DIAGNOSIS — M899 Disorder of bone, unspecified: Secondary | ICD-10-CM

## 2018-12-04 ENCOUNTER — Other Ambulatory Visit: Payer: Self-pay | Admitting: Family Medicine

## 2018-12-08 DIAGNOSIS — M5416 Radiculopathy, lumbar region: Secondary | ICD-10-CM | POA: Diagnosis not present

## 2018-12-08 DIAGNOSIS — M5126 Other intervertebral disc displacement, lumbar region: Secondary | ICD-10-CM | POA: Diagnosis not present

## 2018-12-18 DIAGNOSIS — M5126 Other intervertebral disc displacement, lumbar region: Secondary | ICD-10-CM | POA: Diagnosis not present

## 2019-01-17 ENCOUNTER — Other Ambulatory Visit: Payer: Self-pay | Admitting: Student

## 2019-01-17 DIAGNOSIS — M899 Disorder of bone, unspecified: Secondary | ICD-10-CM

## 2019-01-26 ENCOUNTER — Ambulatory Visit
Admission: RE | Admit: 2019-01-26 | Discharge: 2019-01-26 | Disposition: A | Discharge status: Home / Self Care | Payer: BLUE CROSS/BLUE SHIELD | Source: Ambulatory Visit | Attending: Student | Admitting: Student

## 2019-01-26 ENCOUNTER — Other Ambulatory Visit: Payer: Self-pay

## 2019-01-26 DIAGNOSIS — M899 Disorder of bone, unspecified: Secondary | ICD-10-CM

## 2019-01-26 MED ORDER — IOPAMIDOL (ISOVUE-300) INJECTION 61%
100.0000 mL | Freq: Once | INTRAVENOUS | Status: AC | PRN
  Administered 2019-01-26: 75 mL via INTRAVENOUS

## 2019-03-11 DIAGNOSIS — L03114 Cellulitis of left upper limb: Secondary | ICD-10-CM | POA: Diagnosis not present

## 2019-08-09 ENCOUNTER — Other Ambulatory Visit: Payer: Self-pay | Admitting: Family Medicine

## 2019-08-09 NOTE — Telephone Encounter (Signed)
Requested medication (s) are due for refill today: no  Requested medication (s) are on the active medication list: yes  Last refill: 07/13/2019  Future visit scheduled: no  Notes to clinic: one inhaler should last at least one month Review for refill   Requested Prescriptions  Pending Prescriptions Disp Refills   VENTOLIN HFA 108 (90 Base) MCG/ACT inhaler [Pharmacy Med Name: VENTOLIN HFA INH W/DOS CTR 200PUFFS] 54 g 3    Sig: INHALE 2 PUFFS INTO THE LUNGS EVERY 4 HOURS AS NEEDED FOR WHEEZING OR SHORTNESS OF BREATH     Pulmonology:  Beta Agonists Failed - 08/09/2019  1:09 PM      Failed - One inhaler should last at least one month. If the patient is requesting refills earlier, contact the patient to check for uncontrolled symptoms.      Passed - Valid encounter within last 12 months    Recent Outpatient Visits          10 months ago Right sciatic nerve pain   [facility], Ranell Patrick, MD   11 months ago Strain of lumbar region, initial encounter   [facility], Ranell Patrick, MD   11 months ago Need for prophylactic vaccination and inoculation against influenza   [facility], [facility], [place]   1 year ago Serotonin withdrawal syndrome, initial encounter   [facility] [place], PA-C   1 year ago Attention deficit hyperactivity disorder (ADHD), predominantly inattentive type   [facility] at [facility], Arlie Solomons, MD

## 2019-08-12 DIAGNOSIS — Z20828 Contact with and (suspected) exposure to other viral communicable diseases: Secondary | ICD-10-CM | POA: Diagnosis not present

## 2019-10-23 DIAGNOSIS — Z20828 Contact with and (suspected) exposure to other viral communicable diseases: Secondary | ICD-10-CM | POA: Diagnosis not present

## 2020-01-13 ENCOUNTER — Other Ambulatory Visit: Payer: Self-pay

## 2020-01-13 ENCOUNTER — Ambulatory Visit
Admission: EM | Admit: 2020-01-13 | Discharge: 2020-01-13 | Disposition: A | Discharge status: Home / Self Care | Payer: BC Managed Care – PPO | Attending: Emergency Medicine | Admitting: Emergency Medicine

## 2020-01-13 ENCOUNTER — Encounter: Payer: Self-pay | Admitting: Emergency Medicine

## 2020-01-13 DIAGNOSIS — L249 Irritant contact dermatitis, unspecified cause: Secondary | ICD-10-CM

## 2020-01-13 MED ORDER — TRIAMCINOLONE ACETONIDE 0.5 % EX OINT: 1.0000 "application " | TOPICAL_OINTMENT | Freq: Two times a day (BID) | CUTANEOUS | 0 refills | Status: DC

## 2020-01-13 NOTE — ED Triage Notes (Signed)
patient noticed a red area to left antecubital around noon today.  Unknown source of redness.  Later this afternoon noticed more redness and circled area of redness around area noticed at noon today.  Now has come to ucc with redness and swelling and redness beyond his markings on his left arm

## 2020-01-13 NOTE — ED Provider Notes (Signed)
[name]    CSN: [id number] Arrival date & time: 01/13/20  1551      History   Chief Complaint Chief Complaint  Patient presents with  . Rash    HPI Brian Alvarez is a 34 y.o. male with history of asthma, allergies presenting for left AC rash.  States he noticed some itching earlier this afternoon.  Took Benadryl, applied cool compress with some relief.  Patient states he has a hard time not touching it.  Notices stretch mark has become inflamed as well.  Denies bug bite, trauma to area.    Past Medical History:  Diagnosis Date  . Allergy   . Anemia   . Asthma   . Carpal tunnel syndrome   . Carpal tunnel syndrome     Patient Active Problem List   Diagnosis Date Noted  . Parasomnia, unspecified 10/19/2017  . Allergic asthma due to European house dust mite 10/19/2017  . Confusional arousals 03/28/2017  . PLMD (periodic limb movement disorder) 11/24/2016  . Morbid obesity with body mass index of 45.0-49.9 in adult (HCC) 11/24/2016  . Hypersomnia with sleep apnea 11/24/2016  . Snoring 11/24/2016  . Asthma exacerbation 10/19/2016  . Attention deficit hyperactivity disorder (ADHD) 04/29/2016  . Seasonal allergies 09/12/2012  . Asthma 12/26/2011  . Carpal tunnel syndrome 11/11/2011    Past Surgical History:  Procedure Laterality Date  . BACK SURGERY         Home Medications    Prior to Admission medications   Medication Sig Start Date End Date Taking? Authorizing Provider  amphetamine-dextroamphetamine (ADDERALL) 10 MG tablet Take 1 tablet (10 mg total) by mouth 2 (two) times daily. Patient taking differently: Take 10 mg by mouth daily as needed (focus).  12/03/17   Sherren Mocha, MD  clotrimazole (LOTRIMIN) 1 % cream Apply 1 application topically 2 (two) times daily. 10/02/18   Shade Flood, MD  EPINEPHrine (EPI-PEN) 0.3 mg/0.3 mL DEVI Inject 0.3 mLs (0.3 mg total) into the muscle once. 10/17/12   Elvina Sidle, MD  HYDROcodone-acetaminophen  (NORCO/VICODIN) 5-325 MG tablet Take 1-2 tablets by mouth every 6 (six) hours as needed for moderate pain. 11/06/18   Shade Flood, MD  hydrOXYzine (ATARAX/VISTARIL) 25 MG tablet Take 0.5-1 tablets (12.5-25 mg total) by mouth at bedtime as needed for itching. Patient taking differently: Take 25 mg by mouth at bedtime.  04/27/16   Bing Neighbors, FNP  ibuprofen (ADVIL,MOTRIN) 200 MG tablet Take 600 mg by mouth every 6 (six) hours as needed for headache or moderate pain.     [provider]  levocetirizine (XYZAL) 5 MG tablet Take 5 mg by mouth every evening.    [provider]  methocarbamol (ROBAXIN) 500 MG tablet Take 1 tablet (500 mg total) by mouth 2 (two) times daily. 10/02/18   Shade Flood, MD  montelukast (SINGULAIR) 10 MG tablet Take one daily at bedtime Patient taking differently: Take 10 mg by mouth at bedtime.  10/22/13   Collene Gobble, MD  Multiple Vitamins-Minerals (MULTIVITAMIN WITH MINERALS) tablet Take 1 tablet by mouth daily.    [provider]  naproxen (NAPROSYN) 500 MG tablet Take 1 tablet (500 mg total) by mouth 2 (two) times daily. 09/01/18   Dartha Lodge, PA-C  predniSONE (DELTASONE) 20 MG tablet 3 by mouth for 3 days, then 2 by mouth for 2 days, then 1 by mouth for 2 days, then 1/2 by mouth for 2 days. 10/02/18   Neva Seat,  Asencion Partridge, MD  triamcinolone ointment (KENALOG) 0.5 % Apply 1 application topically 2 (two) times daily. 01/13/20   Hall-Potvin, [place], PA-C  VENTOLIN HFA 108 (90 Base) MCG/ACT inhaler INHALE 2 PUFFS INTO THE LUNGS EVERY 4 HOURS AS NEEDED FOR WHEEZING OR SHORTNESS OF BREATH 08/10/19   Doristine Bosworth, MD    Family History Family History  Problem Relation Age of Onset  . Drug abuse Mother   . Heart failure Mother   . Arthritis Maternal Grandmother   . Heart disease Maternal Grandfather     Social History Social History   Tobacco Use  . Smoking status: Former Games developer  . Smokeless tobacco: Never Used  . Tobacco  comment: has switched to electronic cigarette  Substance Use Topics  . Alcohol use: Yes    Comment: occassional  . Drug use: No     Allergies   Penicillins and Rosemary oil   Review of Systems As per HPI   Physical Exam Triage Vital Signs ED Triage Vitals  Enc Vitals Group     BP      Pulse      Resp      Temp      Temp src      SpO2      Weight      Height      Head Circumference      Peak Flow      Pain Score      Pain Loc      Pain Edu?      Excl. in [place]?    No data found.  Updated Vital Signs BP (!) 158/103 (BP Location: Right Arm) Comment (BP Location): large cuff  Pulse 91   Temp 98.5 F (36.9 C) (Oral)   Resp (!) 22   SpO2 96%   Visual Acuity Right Eye Distance:   Left Eye Distance:   Bilateral Distance:    Right Eye Near:   Left Eye Near:    Bilateral Near:     Physical Exam Constitutional:      General: He is not in acute distress. HENT:     Head: Normocephalic and atraumatic.  Eyes:     General: No scleral icterus.    Pupils: Pupils are equal, round, and reactive to light.  Cardiovascular:     Rate and Rhythm: Normal rate.  Pulmonary:     Effort: Pulmonary effort is normal. No respiratory distress.     Breath sounds: No wheezing.  Skin:    Coloration: Skin is not jaundiced or pale.     Comments: 6 cm area of erythema with mild warmth as compared to right.  Proximal to EAC.  Nontender and without open wound, streaking.  Neurological:     Mental Status: He is alert and oriented to person, place, and time.      UC Treatments / Results  Labs (all labs ordered are listed, but only abnormal results are displayed) Labs Reviewed - No data to display  EKG   Radiology No results found.  Procedures Procedures (including critical care time)  Medications Ordered in UC Medications - No data to display  Initial Impression / Assessment and Plan / UC Course  I have reviewed the triage vital signs and the nursing notes.  Pertinent  labs & imaging results that were available during my care of the patient were reviewed by me and considered in my medical decision making (see chart for details).     Afebrile, nontoxic in office  today.  H&P consistent with irritant dermatitis.  Lesion marked with skin marker.  Will have patient stop touching area of skin, to use large Band-Aid, to avoid introducing bacteria.  Will use triamcinolone twice daily, Benadryl at nighttime and Claritin during the day.  Return precautions discussed, patient verbalized understanding and is agreeable to plan. Final Clinical Impressions(s) / UC Diagnoses   Final diagnoses:  Irritant dermatitis     Discharge Instructions     Keep skin clean and dry. Avoid hot showers as this can further dry out skin and cause irritation. Apply triamcinolone twice daily. May do Cold compresses for 10 minutes 3-5 times daily. Return for worsening rash, itching, pain, fever.    ED Prescriptions    Medication Sig Dispense Auth. Provider   triamcinolone ointment (KENALOG) 0.5 % Apply 1 application topically 2 (two) times daily. 30 g Hall-Potvin, [place], PA-C     PDMP not reviewed this encounter.   Hall-Potvin, [place], [place] 01/13/20 1623

## 2020-01-13 NOTE — Discharge Instructions (Addendum)
Keep skin clean and dry. Avoid hot showers as this can further dry out skin and cause irritation. Apply triamcinolone twice daily. May do Cold compresses for 10 minutes 3-5 times daily. Return for worsening rash, itching, pain, fever.

## 2020-01-14 ENCOUNTER — Other Ambulatory Visit: Payer: Self-pay

## 2020-01-14 ENCOUNTER — Encounter: Payer: Self-pay | Admitting: Emergency Medicine

## 2020-01-14 ENCOUNTER — Ambulatory Visit
Admission: EM | Admit: 2020-01-14 | Discharge: 2020-01-14 | Disposition: A | Discharge status: Home / Self Care | Payer: BC Managed Care – PPO

## 2020-01-14 DIAGNOSIS — L249 Irritant contact dermatitis, unspecified cause: Secondary | ICD-10-CM

## 2020-01-14 NOTE — Discharge Instructions (Signed)
Continue triamcinolone cream. Start ibuprofen 600-800mg  three times a day. Ice compress. Ace wrap to prevent further friction. If worsening pain, swelling, erythema, warmth, fever, contact office.

## 2020-01-14 NOTE — ED Triage Notes (Signed)
Pt here for worsening redness and pain to left AC area where pt was seen yesterday for same

## 2020-01-14 NOTE — ED Provider Notes (Signed)
[name]    CSN: [id number] Arrival date & time: 01/14/20  1626      History   Chief Complaint Chief Complaint  Patient presents with  . Wound Check    HPI Brevan Luberto is a 34 y.o. male.   34 year old male returns to clinic after being seen yesterday for irritant dermatitis.  At the time, erythema to the left elbow flexor surface.  Erythema was marked by sterile pen during visit yesterday.  He was put on triamcinolone cream, and was told to take antihistamine for symptoms.  Erythema has been gradually spreading since leaving clinic yesterday, and has been traveling medially.  States itching has improved due to triamcinolone cream, but has mild worsening and pain to the location.  Area is warm to touch.  Denies fever.     Past Medical History:  Diagnosis Date  . Allergy   . Anemia   . Asthma   . Carpal tunnel syndrome   . Carpal tunnel syndrome     Patient Active Problem List   Diagnosis Date Noted  . Parasomnia, unspecified 10/19/2017  . Allergic asthma due to European house dust mite 10/19/2017  . Confusional arousals 03/28/2017  . PLMD (periodic limb movement disorder) 11/24/2016  . Morbid obesity with body mass index of 45.0-49.9 in adult (HCC) 11/24/2016  . Hypersomnia with sleep apnea 11/24/2016  . Snoring 11/24/2016  . Asthma exacerbation 10/19/2016  . Attention deficit hyperactivity disorder (ADHD) 04/29/2016  . Seasonal allergies 09/12/2012  . Asthma 12/26/2011  . Carpal tunnel syndrome 11/11/2011    Past Surgical History:  Procedure Laterality Date  . BACK SURGERY         Home Medications    Prior to Admission medications   Medication Sig Start Date End Date Taking? Authorizing Provider  clotrimazole (LOTRIMIN) 1 % cream Apply 1 application topically 2 (two) times daily. 10/02/18   Shade Flood, MD  EPINEPHrine (EPI-PEN) 0.3 mg/0.3 mL DEVI Inject 0.3 mLs (0.3 mg total) into the muscle once. 10/17/12   Elvina Sidle, MD   hydrOXYzine (ATARAX/VISTARIL) 25 MG tablet Take 0.5-1 tablets (12.5-25 mg total) by mouth at bedtime as needed for itching. Patient taking differently: Take 25 mg by mouth at bedtime.  04/27/16   Bing Neighbors, FNP  ibuprofen (ADVIL,MOTRIN) 200 MG tablet Take 600 mg by mouth every 6 (six) hours as needed for headache or moderate pain.     [provider]  levocetirizine (XYZAL) 5 MG tablet Take 5 mg by mouth every evening.    [provider]  Multiple Vitamins-Minerals (MULTIVITAMIN WITH MINERALS) tablet Take 1 tablet by mouth daily.    [provider]  triamcinolone ointment (KENALOG) 0.5 % Apply 1 application topically 2 (two) times daily. 01/13/20   Hall-Potvin, [place], PA-C  VENTOLIN HFA 108 (90 Base) MCG/ACT inhaler INHALE 2 PUFFS INTO THE LUNGS EVERY 4 HOURS AS NEEDED FOR WHEEZING OR SHORTNESS OF BREATH 08/10/19   Collie Siad A, MD  amphetamine-dextroamphetamine (ADDERALL) 10 MG tablet Take 1 tablet (10 mg total) by mouth 2 (two) times daily. Patient taking differently: Take 10 mg by mouth daily as needed (focus).  12/03/17 01/13/20  Sherren Mocha, MD  montelukast (SINGULAIR) 10 MG tablet Take one daily at bedtime Patient taking differently: Take 10 mg by mouth at bedtime.  10/22/13 01/13/20  Collene Gobble, MD    Family History Family History  Problem Relation Age of Onset  . Drug abuse Mother   . Heart failure  Mother   . Arthritis Maternal Grandmother   . Heart disease Maternal Grandfather     Social History Social History   Tobacco Use  . Smoking status: Former Research scientist (life sciences)  . Smokeless tobacco: Never Used  . Tobacco comment: has switched to electronic cigarette  Substance Use Topics  . Alcohol use: Yes    Comment: occassional  . Drug use: No     Allergies   Penicillins and Rosemary oil   Review of Systems Review of Systems  Reason unable to perform ROS: See HPI as above.     Physical Exam Triage Vital Signs ED Triage Vitals  Enc Vitals  Group     BP 01/14/20 1655 (!) 152/92     Pulse Rate 01/14/20 1655 90     Resp 01/14/20 1655 18     Temp 01/14/20 1655 98.3 F (36.8 C)     Temp Source 01/14/20 1655 Oral     SpO2 01/14/20 1655 96 %     Weight --      Height --      Head Circumference --      Peak Flow --      Pain Score 01/14/20 1656 2     Pain Loc --      Pain Edu? --      Excl. in [place]? --    No data found.  Updated Vital Signs BP (!) 152/92 (BP Location: Right Arm)   Pulse 90   Temp 98.3 F (36.8 C) (Oral)   Resp 18   SpO2 96%   Physical Exam Constitutional:      General: He is not in acute distress.    Appearance: Normal appearance. He is well-developed. He is not toxic-appearing or diaphoretic.  HENT:     Head: Normocephalic and atraumatic.  Eyes:     Conjunctiva/sclera: Conjunctivae normal.     Pupils: Pupils are equal, round, and reactive to light.  Pulmonary:     Effort: Pulmonary effort is normal. No respiratory distress.     Comments: Speaking in full sentences without difficulty Musculoskeletal:     Cervical back: Normal range of motion and neck supple.  Skin:    General: Skin is warm and dry.     Comments: See picture below.  Erythema extending medially with warmth.  No induration, fluctuance.  No streaking.  Neurological:     Mental Status: He is alert and oriented to person, place, and time.          UC Treatments / Results  Labs (all labs ordered are listed, but only abnormal results are displayed) Labs Reviewed - No data to display  EKG   Radiology No results found.  Procedures Procedures (including critical care time)  Medications Ordered in UC Medications - No data to display  Initial Impression / Assessment and Plan / UC Course  I have reviewed the triage vital signs and the nursing notes.  Pertinent labs & imaging results that were available during my care of the patient were reviewed by me and considered in my medical decision making (see chart for  details).    Patient does experience irritation to the location due to sleeve of shirt during work. Current history and exam less suspicion for cellulitis despite spread in erythema. Discussed to monitor at this time with triamcinolone, ice compress, antihistamine. Can add ibuprofen for pain. Patient is agreeable to this plan. Will have patient monitor closely. To call clinic if symptoms worsening/changing. Return precautions given. Patient expresses understanding  and agrees to plan.  Final Clinical Impressions(s) / UC Diagnoses   Final diagnoses:  Irritant dermatitis   ED Prescriptions    None     PDMP not reviewed this encounter.   Belinda Fisher, PA-C 01/14/20 1726

## 2020-02-20 ENCOUNTER — Other Ambulatory Visit: Payer: Self-pay

## 2020-02-20 MED ORDER — ALBUTEROL SULFATE HFA 108 (90 BASE) MCG/ACT IN AERS: INHALATION_SPRAY | RESPIRATORY_TRACT | 0 refills | Status: DC

## 2020-02-20 NOTE — Telephone Encounter (Signed)
Received prescription request for albuterol inhaler via fax from [facility] . Called patient and left message asking to call and schedule a follow up appt with a new provider,last OV 10/02/18.

## 2020-04-14 ENCOUNTER — Other Ambulatory Visit: Payer: Self-pay | Admitting: Family Medicine

## 2020-04-14 NOTE — Telephone Encounter (Signed)
Requested medications are due for refill today?  Yes   Requested medications are on active medication list?  Yes   Last Refill:  02/20/2020  54 grams with no  refills  Future visit scheduled?  No   Notes to Clinic:  Medication failed RX refill protocol due to no valid encounter in the past 12 months.  Last office visit with Dr. Neva Seat was on 10/02/2018.

## 2020-04-15 NOTE — Telephone Encounter (Signed)
Called pt made app on 04/23/20 @ 4:00 Pm

## 2020-04-15 NOTE — Telephone Encounter (Signed)
Please schedule patient an appt for a f/u. Patient have not been seen in a year and do not look like they have had a toc or  a pcp lisited. 30 day supply was sent as a courtesy last month with no upcoming appt listed

## 2020-04-23 ENCOUNTER — Encounter: Payer: Self-pay | Admitting: Family Medicine

## 2020-04-23 ENCOUNTER — Ambulatory Visit (INDEPENDENT_AMBULATORY_CARE_PROVIDER_SITE_OTHER): Payer: BC Managed Care – PPO | Admitting: Family Medicine

## 2020-04-23 ENCOUNTER — Other Ambulatory Visit: Payer: Self-pay

## 2020-04-23 VITALS — BP 160/96 | HR 93 | Temp 98.3°F | Ht 69.0 in | Wt 315.0 lb

## 2020-04-23 DIAGNOSIS — Z130 Encounter for screening for diseases of the blood and blood-forming organs and certain disorders involving the immune mechanism: Secondary | ICD-10-CM

## 2020-04-23 DIAGNOSIS — Z1329 Encounter for screening for other suspected endocrine disorder: Secondary | ICD-10-CM

## 2020-04-23 DIAGNOSIS — Z1159 Encounter for screening for other viral diseases: Secondary | ICD-10-CM | POA: Diagnosis not present

## 2020-04-23 DIAGNOSIS — Z1388 Encounter for screening for disorder due to exposure to contaminants: Secondary | ICD-10-CM | POA: Diagnosis not present

## 2020-04-23 DIAGNOSIS — Z13228 Encounter for screening for other metabolic disorders: Secondary | ICD-10-CM

## 2020-04-23 DIAGNOSIS — I10 Essential (primary) hypertension: Secondary | ICD-10-CM

## 2020-04-23 DIAGNOSIS — Z23 Encounter for immunization: Secondary | ICD-10-CM | POA: Diagnosis not present

## 2020-04-23 DIAGNOSIS — Z13220 Encounter for screening for lipoid disorders: Secondary | ICD-10-CM | POA: Diagnosis not present

## 2020-04-23 DIAGNOSIS — Z1321 Encounter for screening for nutritional disorder: Secondary | ICD-10-CM

## 2020-04-23 DIAGNOSIS — F909 Attention-deficit hyperactivity disorder, unspecified type: Secondary | ICD-10-CM

## 2020-04-23 MED ORDER — AMLODIPINE BESYLATE 5 MG PO TABS: 5.0000 mg | ORAL_TABLET | Freq: Every day | ORAL | 1 refills | Status: DC

## 2020-04-23 NOTE — Patient Instructions (Addendum)
Start amlodipine once per day for blood pressure.  Follow-up in the next 2 to 3 weeks and we can discuss restarting Adderall at that time if blood pressure is better.  I will check some screening labs as we discussed as well today.  I am glad to hear that your back is better since surgery last year.  Keep working on stretches and flexibility.  Return to the clinic or go to the nearest emergency room if any of your symptoms worsen or new symptoms occur.   Managing Your Hypertension Hypertension is commonly called high blood pressure. This is when the force of your blood pressing against the walls of your arteries is too strong. Arteries are blood vessels that carry blood from your heart throughout your body. Hypertension forces the heart to work harder to pump blood, and may cause the arteries to become narrow or stiff. Having untreated or uncontrolled hypertension can cause heart attack, stroke, kidney disease, and other problems. What are blood pressure readings? A blood pressure reading consists of a higher number over a lower number. Ideally, your blood pressure should be below 120/80. The first ("top") number is called the systolic pressure. It is a measure of the pressure in your arteries as your heart beats. The second ("bottom") number is called the diastolic pressure. It is a measure of the pressure in your arteries as the heart relaxes. What does my blood pressure reading mean? Blood pressure is classified into four stages. Based on your blood pressure reading, your health care provider may use the following stages to determine what type of treatment you need, if any. Systolic pressure and diastolic pressure are measured in a unit called mm Hg. Normal  Systolic pressure: below 120.  Diastolic pressure: below 80. Elevated  Systolic pressure: 120-129.  Diastolic pressure: below 80. Hypertension stage 1  Systolic pressure: 130-139.  Diastolic pressure: 80-89. Hypertension stage  2  Systolic pressure: 140 or above.  Diastolic pressure: 90 or above. What health risks are associated with hypertension? Managing your hypertension is an important responsibility. Uncontrolled hypertension can lead to:  A heart attack.  A stroke.  A weakened blood vessel (aneurysm).  Heart failure.  Kidney damage.  Eye damage.  Metabolic syndrome.  Memory and concentration problems. What changes can I make to manage my hypertension? Hypertension can be managed by making lifestyle changes and possibly by taking medicines. Your health care provider will help you make a plan to bring your blood pressure within a normal range. Eating and drinking   Eat a diet that is high in fiber and potassium, and low in salt (sodium), added sugar, and fat. An example eating plan is called the DASH (Dietary Approaches to Stop Hypertension) diet. To eat this way: ? Eat plenty of fresh fruits and vegetables. Try to fill half of your plate at each meal with fruits and vegetables. ? Eat whole grains, such as whole wheat pasta, brown rice, or whole grain bread. Fill about one quarter of your plate with whole grains. ? Eat low-fat diary products. ? Avoid fatty cuts of meat, processed or cured meats, and poultry with skin. Fill about one quarter of your plate with lean proteins such as fish, chicken without skin, beans, eggs, and tofu. ? Avoid premade and processed foods. These tend to be higher in sodium, added sugar, and fat.  Reduce your daily sodium intake. Most people with hypertension should eat less than 1,500 mg of sodium a day.  Limit alcohol intake to no more  than 1 drink a day for nonpregnant women and 2 drinks a day for men. One drink equals 12 oz of beer, 5 oz of wine, or 1 oz of hard liquor. Lifestyle  Work with your health care provider to maintain a healthy body weight, or to lose weight. Ask what an ideal weight is for you.  Get at least 30 minutes of exercise that causes your  heart to beat faster (aerobic exercise) most days of the week. Activities may include walking, swimming, or biking.  Include exercise to strengthen your muscles (resistance exercise), such as weight lifting, as part of your weekly exercise routine. Try to do these types of exercises for 30 minutes at least 3 days a week.  Do not use any products that contain nicotine or tobacco, such as cigarettes and e-cigarettes. If you need help quitting, ask your health care provider.  Control any long-term (chronic) conditions you have, such as high cholesterol or diabetes. Monitoring  Monitor your blood pressure at home as told by your health care provider. Your personal target blood pressure may vary depending on your medical conditions, your age, and other factors.  Have your blood pressure checked regularly, as often as told by your health care provider. Working with your health care provider  Review all the medicines you take with your health care provider because there may be side effects or interactions.  Talk with your health care provider about your diet, exercise habits, and other lifestyle factors that may be contributing to hypertension.  Visit your health care provider regularly. Your health care provider can help you create and adjust your plan for managing hypertension. Will I need medicine to control my blood pressure? Your health care provider may prescribe medicine if lifestyle changes are not enough to get your blood pressure under control, and if:  Your systolic blood pressure is 130 or higher.  Your diastolic blood pressure is 80 or higher. Take medicines only as told by your health care provider. Follow the directions carefully. Blood pressure medicines must be taken as prescribed. The medicine does not work as well when you skip doses. Skipping doses also puts you at risk for problems. Contact a health care provider if:  You think you are having a reaction to medicines you have  taken.  You have repeated (recurrent) headaches.  You feel dizzy.  You have swelling in your ankles.  You have trouble with your vision. Get help right away if:  You develop a severe headache or confusion.  You have unusual weakness or numbness, or you feel faint.  You have severe pain in your chest or abdomen.  You vomit repeatedly.  You have trouble breathing. Summary  Hypertension is when the force of blood pumping through your arteries is too strong. If this condition is not controlled, it may put you at risk for serious complications.  Your personal target blood pressure may vary depending on your medical conditions, your age, and other factors. For most people, a normal blood pressure is less than 120/80.  Hypertension is managed by lifestyle changes, medicines, or both. Lifestyle changes include weight loss, eating a healthy, low-sodium diet, exercising more, and limiting alcohol. This information is not intended to replace advice given to you by your health care provider. Make sure you discuss any questions you have with your health care provider. Document Revised: 12/29/2018 Document Reviewed: 08/04/2016 Elsevier Patient Education  [employer].  If you have lab work done today you will be contacted with  your lab results within the next 2 weeks.  If you have not heard from [place] then please contact us. The fastest way to get your results is to register for My Chart.   IF you received an x-ray today, you will receive an invoice from [facility]. Please contact [facility] at [phone] with questions or concerns regarding your invoice.   IF you received labwork today, you will receive an invoice from [facility]. Please contact [facility] at [phone] with questions or concerns regarding your invoice.   Our billing staff will not be able to assist you with questions regarding bills from these companies.  You will be contacted with the lab results  as soon as they are available. The fastest way to get your results is to activate your My Chart account. Instructions are located on the last page of this paperwork. If you have not heard from [place] regarding the results in 2 weeks, please contact this office.

## 2020-04-23 NOTE — Progress Notes (Signed)
Subjective:  Patient ID: Brian Alvarez, male    DOB: [birth year]  Age: 34 y.o. MRN: [id number]  CC:  Chief Complaint  Patient presents with  . Follow-up    on siatica. PT reports no issues since last OV/ surgery. pt reports all is well he would like to have some blood work done if possible. such as lead level check, CMP, CBC,Lipid, and  Vitamin D. 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 pt just want to make sure he isn't at risk.  . Medication Refill    pt would like a refill on his adderall and epipens. Pt reports issues with lack of focus. since running out of his adderall.   . Hypertension    BP checks show an elevated BP. pt reports no physiccal symptoms, but has had high BP readings since he hurt his back.    HPI Brian Alvarez presents for  Above sx's. Last visit with me in 2020.   Hypertension: Blood pressure was 138/88 with a weight of 315 in March 2020 Elevated BP at Urgent Care in April.   Some job related stress prior, but better now.  Alcohol - rare - 2 times per week - 1-2 beers.  No illicit drug use. No recent marijuana - years ago.  Home readings: BP Readings from Last 3 Encounters:  04/23/20 (!) 160/96  01/14/20 (!) 152/92  01/13/20 (!) 158/103   Lab Results  Component Value Date   CREATININE 0.88 12/28/2017   Constitutional: Negative for fatigue and unexpected weight change.  Eyes: Negative for visual disturbance.  Respiratory: Negative for cough, chest tightness and shortness of breath.   Cardiovascular: Negative for chest pain, palpitations and leg swelling.  Gastrointestinal: Negative for abdominal pain and blood in stool.  Neurological: Negative for dizziness, light-headedness and headaches.   Making own ammunition - uses gloves, mask most of the time. Would like lead level.   Off adderall for 6 months - some decreased focus off meds.   History of sciatica, herniated  disc, discectomy L4-5 by Dr. Lovell SheehanJenkins in March 2020, significantly improved since that time and off pain medications, approximately week after surgery.  Some episodic discomfort for a day or 2 after heavy lifting but otherwise doing well and is regaining flexibility   History Patient Active Problem List   Diagnosis Date Noted  . Parasomnia, unspecified 10/19/2017  . Allergic asthma due to European house dust mite 10/19/2017  . Confusional arousals 03/28/2017  . PLMD (periodic limb movement disorder) 11/24/2016  . Morbid obesity with body mass index of 45.0-49.9 in adult (HCC) 11/24/2016  . Hypersomnia with sleep apnea 11/24/2016  . Snoring 11/24/2016  . Asthma exacerbation 10/19/2016  . Attention deficit hyperactivity disorder (ADHD) 04/29/2016  . Seasonal allergies 09/12/2012  . Asthma 12/26/2011  . Carpal tunnel syndrome 11/11/2011   Past Medical History:  Diagnosis Date  . Allergy   . Anemia   . Asthma   . Carpal tunnel syndrome   . Carpal tunnel syndrome    Past Surgical History:  Procedure Laterality Date  . BACK SURGERY     Allergies  Allergen Reactions  . Penicillins     Hives Has patient had a PCN reaction causing immediate rash, facial/tongue/throat swelling, SOB or lightheadedness with hypotension: yes Has patient had a PCN reaction causing severe rash involving mucus membranes or skin necrosis: yes Has patient had a PCN reaction that required hospitalization: no  Has patient had a PCN reaction occurring within the last 10 years: yes If all of the above answers are "NO", then may proceed with Cephalosporin use.  Marland Kitchen Rosemary Oil    Prior to Admission medications   Medication Sig Start Date End Date Taking? Authorizing Provider  albuterol (VENTOLIN HFA) 108 (90 Base) MCG/ACT inhaler INHALE 2 PUFFS INTO THE LUNGS EVERY 4 HOURS AS NEEDED FOR WHEEZING OR SHORTNESS OF BREATH 02/20/20  Yes Myles Lipps, MD  ibuprofen (ADVIL,MOTRIN) 200 MG tablet Take 600 mg by mouth  every 6 (six) hours as needed for headache or moderate pain.    Yes [provider]  levocetirizine (XYZAL) 5 MG tablet Take 5 mg by mouth every evening.   Yes [provider]  Multiple Vitamins-Minerals (MULTIVITAMIN WITH MINERALS) tablet Take 1 tablet by mouth daily.   Yes [provider]  clotrimazole (LOTRIMIN) 1 % cream Apply 1 application topically 2 (two) times daily. Patient not taking: Reported on 04/23/2020 10/02/18   Shade Flood, MD  EPINEPHrine (EPI-PEN) 0.3 mg/0.3 mL DEVI Inject 0.3 mLs (0.3 mg total) into the muscle once. Patient not taking: Reported on 04/23/2020 10/17/12   Elvina Sidle, MD  hydrOXYzine (ATARAX/VISTARIL) 25 MG tablet Take 0.5-1 tablets (12.5-25 mg total) by mouth at bedtime as needed for itching. Patient not taking: Reported on 04/23/2020 04/27/16   Bing Neighbors, FNP  triamcinolone ointment (KENALOG) 0.5 % Apply 1 application topically 2 (two) times daily. Patient not taking: Reported on 04/23/2020 01/13/20   Hall-Potvin, [place], PA-C  amphetamine-dextroamphetamine (ADDERALL) 10 MG tablet Take 1 tablet (10 mg total) by mouth 2 (two) times daily. Patient taking differently: Take 10 mg by mouth daily as needed (focus).  12/03/17 01/13/20  Sherren Mocha, MD  montelukast (SINGULAIR) 10 MG tablet Take one daily at bedtime Patient taking differently: Take 10 mg by mouth at bedtime.  10/22/13 01/13/20  Collene Gobble, MD   Social History   Socioeconomic History  . Marital status: Married    Spouse name: Not on file  . Number of children: Not on file  . Years of education: Not on file  . Highest education level: Not on file  Occupational History  . Not on file  Tobacco Use  . Smoking status: Former Games developer  . Smokeless tobacco: Never Used  . Tobacco comment: has switched to electronic cigarette  Substance and Sexual Activity  . Alcohol use: Yes    Comment: occassional  . Drug use: No  . Sexual activity: Yes  Other Topics Concern  .  Not on file  Social History Narrative  . Not on file   Social Determinants of Health   Financial Resource Strain:   . Difficulty of Paying Living Expenses:   Food Insecurity:   . Worried About Programme researcher, broadcasting/film/video in the Last Year:   . Barista in the Last Year:   Transportation Needs:   . Freight forwarder (Medical):   Marland Kitchen Lack of Transportation (Non-Medical):   Physical Activity:   . Days of Exercise per Week:   . Minutes of Exercise per Session:   Stress:   . Feeling of Stress :   Social Connections:   . Frequency of Communication with Friends and Family:   . Frequency of Social Gatherings with Friends and Family:   . Attends Religious Services:   . Active Member of Clubs or Organizations:   . Attends Banker Meetings:   Marland Kitchen Marital  Status:   Intimate Partner Violence:   . Fear of Current or Ex-Partner:   . Emotionally Abused:   Marland Kitchen Physically Abused:   . Sexually Abused:     Review of Systems  Constitutional: Negative for fatigue and unexpected weight change.  Eyes: Negative for visual disturbance.  Respiratory: Negative for cough, chest tightness and shortness of breath.   Cardiovascular: Negative for chest pain, palpitations and leg swelling.  Gastrointestinal: Negative for abdominal pain and blood in stool.  Neurological: Negative for dizziness, light-headedness and headaches.     Objective:   Vitals:   04/23/20 1610 04/23/20 1621  BP: (!) 167/110 (!) 160/96  Pulse: 93   Temp: 98.3 F (36.8 C)   TempSrc: Temporal   SpO2: 97%   Weight: (!) 315 lb (142.9 kg)   Height: 5\' 9"  (1.753 m)      Physical Exam Vitals reviewed.  Constitutional:      Appearance: He is well-developed. He is obese.  HENT:     Head: Normocephalic and atraumatic.  Eyes:     Pupils: Pupils are equal, round, and reactive to light.  Neck:     Vascular: No carotid bruit or JVD.  Cardiovascular:     Rate and Rhythm: Normal rate and regular rhythm.     Heart  sounds: Normal heart sounds. No murmur heard.   Pulmonary:     Effort: Pulmonary effort is normal.     Breath sounds: Normal breath sounds. No rales.  Skin:    General: Skin is warm and dry.  Neurological:     Mental Status: He is alert and oriented to person, place, and time.        Assessment & Plan:  Henry Utsey is a 34 y.o. male . Essential hypertension - Plan: Comprehensive metabolic panel, CBC, TSH, amLODipine (NORVASC) 5 MG tablet Screening for thyroid disorder - Plan: TSH  - new diagnosis htn. Asymptomatic. Start amlodipine. Check labs above, recheck in 2 weeks.   Need for hepatitis C screening test - Plan: Hepatitis C antibody  Screening for lead exposure - Plan: Lead, blood  Screening for hyperlipidemia - Plan: Lipid panel, Comprehensive metabolic panel, CBC  Need for prophylactic vaccination with combined diphtheria-tetanus-pertussis (DTP) vaccine - Plan: Tdap vaccine greater than or equal to 7yo IM  Attention deficit hyperactivity disorder (ADHD), unspecified ADHD type  - hold on stimulant start intially until improved BP control, follow up in 2 weeks.   Screening for endocrine, nutritional, metabolic and immunity disorder - Plan: Vitamin D, 25-hydroxy    Meds ordered this encounter  Medications  . amLODipine (NORVASC) 5 MG tablet    Sig: Take 1 tablet (5 mg total) by mouth daily.    Dispense:  90 tablet    Refill:  1   Patient Instructions   Start amlodipine once per day for blood pressure.  Follow-up in the next 2 to 3 weeks and we can discuss restarting Adderall at that time if blood pressure is better.  I will check some screening labs as we discussed as well today.  I am glad to hear that your back is better since surgery last year.  Keep working on stretches and flexibility.  Return to the clinic or go to the nearest emergency room if any of your symptoms worsen or new symptoms occur.   Managing Your Hypertension Hypertension is commonly called  high blood pressure. This is when the force of your blood pressing against the walls of your arteries is too strong.  Arteries are blood vessels that carry blood from your heart throughout your body. Hypertension forces the heart to work harder to pump blood, and may cause the arteries to become narrow or stiff. Having untreated or uncontrolled hypertension can cause heart attack, stroke, kidney disease, and other problems. What are blood pressure readings? A blood pressure reading consists of a higher number over a lower number. Ideally, your blood pressure should be below 120/80. The first ("top") number is called the systolic pressure. It is a measure of the pressure in your arteries as your heart beats. The second ("bottom") number is called the diastolic pressure. It is a measure of the pressure in your arteries as the heart relaxes. What does my blood pressure reading mean? Blood pressure is classified into four stages. Based on your blood pressure reading, your health care provider may use the following stages to determine what type of treatment you need, if any. Systolic pressure and diastolic pressure are measured in a unit called mm Hg. Normal  Systolic pressure: below 120.  Diastolic pressure: below 80. Elevated  Systolic pressure: 120-129.  Diastolic pressure: below 80. Hypertension stage 1  Systolic pressure: 130-139.  Diastolic pressure: 80-89. Hypertension stage 2  Systolic pressure: 140 or above.  Diastolic pressure: 90 or above. What health risks are associated with hypertension? Managing your hypertension is an important responsibility. Uncontrolled hypertension can lead to:  A heart attack.  A stroke.  A weakened blood vessel (aneurysm).  Heart failure.  Kidney damage.  Eye damage.  Metabolic syndrome.  Memory and concentration problems. What changes can I make to manage my hypertension? Hypertension can be managed by making lifestyle changes and possibly  by taking medicines. Your health care provider will help you make a plan to bring your blood pressure within a normal range. Eating and drinking   Eat a diet that is high in fiber and potassium, and low in salt (sodium), added sugar, and fat. An example eating plan is called the DASH (Dietary Approaches to Stop Hypertension) diet. To eat this way: ? Eat plenty of fresh fruits and vegetables. Try to fill half of your plate at each meal with fruits and vegetables. ? Eat whole grains, such as whole wheat pasta, brown rice, or whole grain bread. Fill about one quarter of your plate with whole grains. ? Eat low-fat diary products. ? Avoid fatty cuts of meat, processed or cured meats, and poultry with skin. Fill about one quarter of your plate with lean proteins such as fish, chicken without skin, beans, eggs, and tofu. ? Avoid premade and processed foods. These tend to be higher in sodium, added sugar, and fat.  Reduce your daily sodium intake. Most people with hypertension should eat less than 1,500 mg of sodium a day.  Limit alcohol intake to no more than 1 drink a day for nonpregnant women and 2 drinks a day for men. One drink equals 12 oz of beer, 5 oz of wine, or 1 oz of hard liquor. Lifestyle  Work with your health care provider to maintain a healthy body weight, or to lose weight. Ask what an ideal weight is for you.  Get at least 30 minutes of exercise that causes your heart to beat faster (aerobic exercise) most days of the week. Activities may include walking, swimming, or biking.  Include exercise to strengthen your muscles (resistance exercise), such as weight lifting, as part of your weekly exercise routine. Try to do these types of exercises for 30 minutes at  least 3 days a week.  Do not use any products that contain nicotine or tobacco, such as cigarettes and e-cigarettes. If you need help quitting, ask your health care provider.  Control any long-term (chronic) conditions you  have, such as high cholesterol or diabetes. Monitoring  Monitor your blood pressure at home as told by your health care provider. Your personal target blood pressure may vary depending on your medical conditions, your age, and other factors.  Have your blood pressure checked regularly, as often as told by your health care provider. Working with your health care provider  Review all the medicines you take with your health care provider because there may be side effects or interactions.  Talk with your health care provider about your diet, exercise habits, and other lifestyle factors that may be contributing to hypertension.  Visit your health care provider regularly. Your health care provider can help you create and adjust your plan for managing hypertension. Will I need medicine to control my blood pressure? Your health care provider may prescribe medicine if lifestyle changes are not enough to get your blood pressure under control, and if:  Your systolic blood pressure is 130 or higher.  Your diastolic blood pressure is 80 or higher. Take medicines only as told by your health care provider. Follow the directions carefully. Blood pressure medicines must be taken as prescribed. The medicine does not work as well when you skip doses. Skipping doses also puts you at risk for problems. Contact a health care provider if:  You think you are having a reaction to medicines you have taken.  You have repeated (recurrent) headaches.  You feel dizzy.  You have swelling in your ankles.  You have trouble with your vision. Get help right away if:  You develop a severe headache or confusion.  You have unusual weakness or numbness, or you feel faint.  You have severe pain in your chest or abdomen.  You vomit repeatedly.  You have trouble breathing. Summary  Hypertension is when the force of blood pumping through your arteries is too strong. If this condition is not controlled, it may put  you at risk for serious complications.  Your personal target blood pressure may vary depending on your medical conditions, your age, and other factors. For most people, a normal blood pressure is less than 120/80.  Hypertension is managed by lifestyle changes, medicines, or both. Lifestyle changes include weight loss, eating a healthy, low-sodium diet, exercising more, and limiting alcohol. This information is not intended to replace advice given to you by your health care provider. Make sure you discuss any questions you have with your health care provider. Document Revised: 12/29/2018 Document Reviewed: 08/04/2016 Elsevier Patient Education  [employer].  If you have lab work done today you will be contacted with your lab results within the next 2 weeks.  If you have not heard from [place] then please contact us. The fastest way to get your results is to register for My Chart.   IF you received an x-ray today, you will receive an invoice from [facility]. Please contact [facility] at [phone] with questions or concerns regarding your invoice.   IF you received labwork today, you will receive an invoice from [facility]. Please contact [facility] at [phone] with questions or concerns regarding your invoice.   Our billing staff will not be able to assist you with questions regarding bills from these companies.  You will be contacted with the lab results as soon  as they are available. The fastest way to get your results is to activate your My Chart account. Instructions are located on the last page of this paperwork. If you have not heard from [place] regarding the results in 2 weeks, please contact this office.          Signed, Meredith Staggers, MD [facility]

## 2020-04-24 LAB — COMPREHENSIVE METABOLIC PANEL
ALT: 27 IU/L (ref 0–44)
AST: 20 IU/L (ref 0–40)
Albumin/Globulin Ratio: 1.8 (ref 1.2–2.2)
Albumin: 4.6 g/dL (ref 4.0–5.0)
Alkaline Phosphatase: 81 IU/L (ref 48–121)
BUN/Creatinine Ratio: 13 (ref 9–20)
BUN: 13 mg/dL (ref 6–20)
Bilirubin Total: 0.3 mg/dL (ref 0.0–1.2)
CO2: 23 mmol/L (ref 20–29)
Calcium: 9.2 mg/dL (ref 8.7–10.2)
Chloride: 101 mmol/L (ref 96–106)
Creatinine, Ser: 1 mg/dL (ref 0.76–1.27)
GFR calc Af Amer: 112 mL/min/{1.73_m2} (ref >59)
GFR calc non Af Amer: 97 mL/min/{1.73_m2} (ref >59)
Globulin, Total: 2.5 g/dL (ref 1.5–4.5)
Glucose: 86 mg/dL (ref 65–99)
Potassium: 4.1 mmol/L (ref 3.5–5.2)
Sodium: 138 mmol/L (ref 134–144)
Total Protein: 7.1 g/dL (ref 6.0–8.5)

## 2020-04-24 LAB — CBC
Hematocrit: 47.9 % (ref 37.5–51.0)
Hemoglobin: 15.9 g/dL (ref 13.0–17.7)
MCH: 28.6 pg (ref 26.6–33.0)
MCHC: 33.2 g/dL (ref 31.5–35.7)
MCV: 86 fL (ref 79–97)
Platelets: 232 10*3/uL (ref 150–450)
RBC: 5.55 x10E6/uL (ref 4.14–5.80)
RDW: 13 % (ref 11.6–15.4)
WBC: 7.5 10*3/uL (ref 3.4–10.8)

## 2020-04-24 LAB — LIPID PANEL
Chol/HDL Ratio: 6 ratio — ABNORMAL HIGH (ref 0.0–5.0)
Cholesterol, Total: 157 mg/dL (ref 100–199)
HDL: 26 mg/dL — ABNORMAL LOW (ref >39)
LDL Chol Calc (NIH): 89 mg/dL (ref 0–99)
Triglycerides: 247 mg/dL — ABNORMAL HIGH (ref 0–149)
VLDL Cholesterol Cal: 42 mg/dL — ABNORMAL HIGH (ref 5–40)

## 2020-04-24 LAB — TSH: TSH: 1.02 u[IU]/mL (ref 0.450–4.500)

## 2020-04-24 LAB — HEPATITIS C ANTIBODY: Hep C Virus Ab: <0.1 s/co ratio (ref 0.0–0.9)

## 2020-04-24 LAB — VITAMIN D 25 HYDROXY (VIT D DEFICIENCY, FRACTURES): Vit D, 25-Hydroxy: 19.2 ng/mL — ABNORMAL LOW (ref 30.0–100.0)

## 2020-04-24 LAB — LEAD, BLOOD (ADULT >= 16 YRS): Lead-Whole Blood: 1 ug/dL (ref 0–4)

## 2020-05-07 ENCOUNTER — Other Ambulatory Visit: Payer: Self-pay

## 2020-05-07 ENCOUNTER — Encounter: Payer: Self-pay | Admitting: Family Medicine

## 2020-05-07 ENCOUNTER — Ambulatory Visit (INDEPENDENT_AMBULATORY_CARE_PROVIDER_SITE_OTHER): Payer: BC Managed Care – PPO | Admitting: Family Medicine

## 2020-05-07 VITALS — BP 129/86 | HR 85 | Temp 98.4°F | Ht 69.0 in | Wt 311.0 lb

## 2020-05-07 DIAGNOSIS — F411 Generalized anxiety disorder: Secondary | ICD-10-CM

## 2020-05-07 DIAGNOSIS — J3081 Allergic rhinitis due to animal (cat) (dog) hair and dander: Secondary | ICD-10-CM | POA: Diagnosis not present

## 2020-05-07 DIAGNOSIS — I10 Essential (primary) hypertension: Secondary | ICD-10-CM

## 2020-05-07 DIAGNOSIS — J4520 Mild intermittent asthma, uncomplicated: Secondary | ICD-10-CM

## 2020-05-07 DIAGNOSIS — E559 Vitamin D deficiency, unspecified: Secondary | ICD-10-CM

## 2020-05-07 DIAGNOSIS — F988 Other specified behavioral and emotional disorders with onset usually occurring in childhood and adolescence: Secondary | ICD-10-CM | POA: Diagnosis not present

## 2020-05-07 MED ORDER — FLOVENT HFA 44 MCG/ACT IN AERO: 2.0000 | INHALATION_SPRAY | Freq: Two times a day (BID) | RESPIRATORY_TRACT | 5 refills | Status: DC

## 2020-05-07 MED ORDER — EPINEPHRINE 0.3 MG/0.3ML IJ SOAJ: 0.3000 mg | INTRAMUSCULAR | 0 refills | Status: DC | PRN

## 2020-05-07 MED ORDER — ALBUTEROL SULFATE HFA 108 (90 BASE) MCG/ACT IN AERS: INHALATION_SPRAY | RESPIRATORY_TRACT | 2 refills | Status: DC

## 2020-05-07 NOTE — Progress Notes (Signed)
Subjective:  Patient ID: Brian Alvarez, male    DOB: [birth year]  Age: 34 y.o. MRN: [id number]  CC:  Chief Complaint  Patient presents with  . Hypertension    Pt reports no issues with BP since last OV. Pt states he has been chacking his BP with a wrist monitior and it has been reading really high, but when checked on a arm cuff its been reading better. pt states still high but not as. pt states it's been running around 160/90 range. pt reports he isn't sure how accuret these 2 cuffs are.  . medication questions    Pt would like to discuss non stimulents ADHD medication. Pt states he has always take a stimulent ADHD medication, but his his BP medication pt feels taking a stimulent wouldn't make much sence, so pt would like to discuss other options. Pt needs a new Epi-pen Rx sent in as well.    HPI Brian LernerJames Obar presents for   Hypertension: Uncontrolled on August 4 visit, other concerns discussed at that time.  Some job-related stress.New diagnosis hypertension.  Started amlodipine 5 mg daily.  Normal TSH, CMP, CBC.  Home readings: BP Readings from Last 3 Encounters:  05/07/20 129/86  04/23/20 (!) 160/96  01/14/20 (!) 152/92   Lab Results  Component Value Date   CREATININE 1.00 04/23/2020   Low vitamin D Level of 19 on August 4 labs, 2000 units daily over-the-counter recommended.taking supplement daily.   Attention deficit disorder Off meds for at least a year and a half.  Previously had been on Adderall 10 mg once per day in the morning. Tolerated well until took late in day. Problem with attention and executive functioning, focus, trouble with task completion, easily distracted   Previously treated by Benny LennertSarah Weber. Diagnosed by psychiatry prior.  Considering nonstimulant.  Some anxiety as well currently. zoloft in past, trouble tapering off zoloft - wants to avoid.  Anxiety past year. Therapy in past, but concerned about weekly time commitments.  No relief with Buspar in 2019 (10mg   BID).  Easily overwhelmed.  Would like to try therapy instead of meds for anxiety. Job not in jeopardy currently.     [facility]  PHQ-2 Total Score 0      GAD 7 : Generalized Anxiety Score 08/26/2018  Nervous, Anxious, on Edge 3  Control/stop worrying 3  Worry too much - different things 3  Trouble relaxing 2  Restless 2  Easily annoyed or irritable 2  Afraid - awful might happen 0  Total GAD 7 Score 15  Anxiety Difficulty Extremely difficult    Hx of lip and mouth swelling with dog allergy. Needs epipen.   Asthma: Albuterol as needed. Depends on environment - fall with leaves and mold - uses up to once per day, and same in spring, not needed much in winter or summer (unless around allergen exposure). xyzal daily. Flonase daily.  Prior on advair.  Had allergist - not seen in 3 years.  Prior on allergy shots.      History Patient Active Problem List   Diagnosis Date Noted  . Parasomnia, unspecified 10/19/2017  . Allergic asthma due to European house dust mite 10/19/2017  . Confusional arousals 03/28/2017  . PLMD (periodic limb movement disorder) 11/24/2016  . Morbid obesity with body mass index of 45.0-49.9 in adult (HCC) 11/24/2016  . Hypersomnia with sleep apnea 11/24/2016  . Snoring 11/24/2016  . Asthma exacerbation 10/19/2016  . Attention deficit  hyperactivity disorder (ADHD) 04/29/2016  . Seasonal allergies 09/12/2012  . Asthma 12/26/2011  . Carpal tunnel syndrome 11/11/2011   Past Medical History:  Diagnosis Date  . Allergy   . Anemia   . Asthma   . Carpal tunnel syndrome   . Carpal tunnel syndrome    Past Surgical History:  Procedure Laterality Date  . BACK SURGERY     Allergies  Allergen Reactions  . Penicillins     Hives Has patient had a PCN reaction causing immediate rash, facial/tongue/throat swelling, SOB or lightheadedness with hypotension: yes Has patient had a PCN reaction causing severe rash  involving mucus membranes or skin necrosis: yes Has patient had a PCN reaction that required hospitalization: no Has patient had a PCN reaction occurring within the last 10 years: yes If all of the above answers are "NO", then may proceed with Cephalosporin use.  Marland Kitchen Rosemary Oil    Prior to Admission medications   Medication Sig Start Date End Date Taking? Authorizing Provider  albuterol (VENTOLIN HFA) 108 (90 Base) MCG/ACT inhaler INHALE 2 PUFFS INTO THE LUNGS EVERY 4 HOURS AS NEEDED FOR WHEEZING OR SHORTNESS OF BREATH 02/20/20  Yes Myles Lipps, MD  amLODipine (NORVASC) 5 MG tablet Take 1 tablet (5 mg total) by mouth daily. 04/23/20  Yes Shade Flood, MD  clotrimazole (LOTRIMIN) 1 % cream Apply 1 application topically 2 (two) times daily. 10/02/18  Yes Shade Flood, MD  EPINEPHrine (EPI-PEN) 0.3 mg/0.3 mL DEVI Inject 0.3 mLs (0.3 mg total) into the muscle once. 10/17/12  Yes Elvina Sidle, MD  hydrOXYzine (ATARAX/VISTARIL) 25 MG tablet Take 0.5-1 tablets (12.5-25 mg total) by mouth at bedtime as needed for itching. 04/27/16  Yes Bing Neighbors, FNP  ibuprofen (ADVIL,MOTRIN) 200 MG tablet Take 600 mg by mouth every 6 (six) hours as needed for headache or moderate pain.    Yes [provider]  levocetirizine (XYZAL) 5 MG tablet Take 5 mg by mouth every evening.   Yes [provider]  Multiple Vitamins-Minerals (MULTIVITAMIN WITH MINERALS) tablet Take 1 tablet by mouth daily.   Yes [provider]  triamcinolone ointment (KENALOG) 0.5 % Apply 1 application topically 2 (two) times daily. 01/13/20  Yes Hall-Potvin, [place], PA-C  amphetamine-dextroamphetamine (ADDERALL) 10 MG tablet Take 1 tablet (10 mg total) by mouth 2 (two) times daily. Patient taking differently: Take 10 mg by mouth daily as needed (focus).  12/03/17 01/13/20  Sherren Mocha, MD  montelukast (SINGULAIR) 10 MG tablet Take one daily at bedtime Patient taking differently: Take 10 mg by mouth at  bedtime.  10/22/13 01/13/20  Collene Gobble, MD   Social History   Socioeconomic History  . Marital status: Married    Spouse name: Not on file  . Number of children: Not on file  . Years of education: Not on file  . Highest education level: Not on file  Occupational History  . Not on file  Tobacco Use  . Smoking status: Former Games developer  . Smokeless tobacco: Never Used  . Tobacco comment: has switched to electronic cigarette  Substance and Sexual Activity  . Alcohol use: Yes    Comment: occassional  . Drug use: No  . Sexual activity: Yes  Other Topics Concern  . Not on file  Social History Narrative  . Not on file   Social Determinants of Health   Financial Resource Strain:   . Difficulty of Paying Living Expenses: Not on file  Food Insecurity:   .  Worried About Programme researcher, broadcasting/film/video in the Last Year: Not on file  . Ran Out of Food in the Last Year: Not on file  Transportation Needs:   . Lack of Transportation (Medical): Not on file  . Lack of Transportation (Non-Medical): Not on file  Physical Activity:   . Days of Exercise per Week: Not on file  . Minutes of Exercise per Session: Not on file  Stress:   . Feeling of Stress : Not on file  Social Connections:   . Frequency of Communication with Friends and Family: Not on file  . Frequency of Social Gatherings with Friends and Family: Not on file  . Attends Religious Services: Not on file  . Active Member of Clubs or Organizations: Not on file  . Attends Banker Meetings: Not on file  . Marital Status: Not on file  Intimate Partner Violence:   . Fear of Current or Ex-Partner: Not on file  . Emotionally Abused: Not on file  . Physically Abused: Not on file  . Sexually Abused: Not on file    Review of Systems  Constitutional: Negative for fatigue and unexpected weight change.  Eyes: Negative for visual disturbance.  Respiratory: Negative for cough, chest tightness and shortness of breath.   Cardiovascular:  Negative for chest pain, palpitations and leg swelling.  Gastrointestinal: Negative for abdominal pain and blood in stool.  Neurological: Negative for dizziness, light-headedness and headaches.     Objective:   Vitals:   05/07/20 1625  BP: 129/86  Pulse: 85  Temp: 98.4 F (36.9 C)  TempSrc: Temporal  SpO2: 97%  Weight: (!) 311 lb (141.1 kg)  Height: 5\' 9"  (1.753 m)     Physical Exam Vitals reviewed.  Constitutional:      Appearance: He is well-developed.  HENT:     Head: Normocephalic and atraumatic.  Eyes:     Pupils: Pupils are equal, round, and reactive to light.  Neck:     Vascular: No carotid bruit or JVD.  Cardiovascular:     Rate and Rhythm: Normal rate and regular rhythm.     Heart sounds: Normal heart sounds. No murmur heard.   Pulmonary:     Effort: Pulmonary effort is normal. No respiratory distress.     Breath sounds: Normal breath sounds. No wheezing or rales.  Skin:    General: Skin is warm and dry.  Neurological:     Mental Status: He is alert and oriented to person, place, and time.        Assessment & Plan:  Dailyn Kempner is a 34 y.o. male . Mild intermittent asthma without complication - Plan: albuterol (VENTOLIN HFA) 108 (90 Base) MCG/ACT inhaler, fluticasone (FLOVENT HFA) 44 MCG/ACT inhaler  -Intermittent flares, seasonal or with allergen exposure.  Option of Flovent HFA with increased symptoms, or increased albuterol need.  Could titrate back up  to Advair but less likely needed at this time, also recommended allergist follow-up.   Allergy to dogs - Plan: EPINEPHrine (EPIPEN 2-PAK) 0.3 mg/0.3 mL IJ SOAJ injection  -EpiPen provided if needed.  Anxiety state Attention deficit disorder, unspecified hyperactivity presence  -Possible dual diagnosis of ADHD as well as anxiety.  Treatment options discussed.  He decided against medication for anxiety at this time with previous Zoloft experience.  He will initially meet with counseling, and formal  eval for ADD.  Potentially could consider Wellbutrin, Strattera if nonstimulant approach.  Meds were deferred at this time.  Phone numbers provided for  counseling as well as [facility] for evaluation.  Vitamin D deficiency  -Tolerating over-the-counter treatment, recheck levels after 6 weeks.  Essential hypertension  -Improved, continue same regimen.  Meds ordered this encounter  Medications  . albuterol (VENTOLIN HFA) 108 (90 Base) MCG/ACT inhaler    Sig: INHALE 2 PUFFS INTO THE LUNGS EVERY 4 HOURS AS NEEDED FOR WHEEZING OR SHORTNESS OF BREATH    Dispense:  54 g    Refill:  2  . EPINEPHrine (EPIPEN 2-PAK) 0.3 mg/0.3 mL IJ SOAJ injection    Sig: Inject 0.3 mLs (0.3 mg total) into the muscle as needed for anaphylaxis.    Dispense:  2 each    Refill:  0  . fluticasone (FLOVENT HFA) 44 MCG/ACT inhaler    Sig: Inhale 2 puffs into the lungs in the morning and at bedtime.    Dispense:  1 each    Refill:  5   Patient Instructions   With anxiety and add symptoms, it may be best to get a formal evaluation to see how best to proceed, and to meet with therapist.   Please call [facility]:  Address: [address] [place], [place], [place] [address] Phone: [phone]  Here are a few options for counseling, and you can ask about virtual options.   [facility]:  [phone]  [facility] [phone]  No change in blood pressure meds for now.   I do recommend follow up with allergist. In the meantime, start flovent 2 puffs twice per day if albuterol needed more than once per week. epipen refilled if needed. Continue xyzal, flonase spray.    If you have lab work done today you will be contacted with your lab results within the next 2 weeks.  If you have not heard from [place] then please contact us. The fastest way to get your results is to register for My Chart.   IF you received an x-ray today, you will receive an  invoice from [facility]. Please contact [facility] at [phone] with questions or concerns regarding your invoice.   IF you received labwork today, you will receive an invoice from [facility]. Please contact [facility] at [phone] with questions or concerns regarding your invoice.   Our billing staff will not be able to assist you with questions regarding bills from these companies.  You will be contacted with the lab results as soon as they are available. The fastest way to get your results is to activate your My Chart account. Instructions are located on the last page of this paperwork. If you have not heard from [place] regarding the results in 2 weeks, please contact this office.         Signed, Meredith Staggers, MD [facility]

## 2020-05-07 NOTE — Patient Instructions (Addendum)
With anxiety and add symptoms, it may be best to get a formal evaluation to see how best to proceed, and to meet with therapist.   Please call [facility]:  Address: [address] [place], [place], [place] [address] Phone: [phone]  Here are a few options for counseling, and you can ask about virtual options.   [facility]:  [phone]  [facility] [phone]  No change in blood pressure meds for now.   I do recommend follow up with allergist. In the meantime, start flovent 2 puffs twice per day if albuterol needed more than once per week. epipen refilled if needed. Continue xyzal, flonase spray.    If you have lab work done today you will be contacted with your lab results within the next 2 weeks.  If you have not heard from [place] then please contact us. The fastest way to get your results is to register for My Chart.   IF you received an x-ray today, you will receive an invoice from [facility]. Please contact [facility] at [phone] with questions or concerns regarding your invoice.   IF you received labwork today, you will receive an invoice from [facility]. Please contact [facility] at [phone] with questions or concerns regarding your invoice.   Our billing staff will not be able to assist you with questions regarding bills from these companies.  You will be contacted with the lab results as soon as they are available. The fastest way to get your results is to activate your My Chart account. Instructions are located on the last page of this paperwork. If you have not heard from [place] regarding the results in 2 weeks, please contact this office.

## 2020-05-08 ENCOUNTER — Encounter: Payer: Self-pay | Admitting: Family Medicine

## 2020-05-29 DIAGNOSIS — F411 Generalized anxiety disorder: Secondary | ICD-10-CM | POA: Diagnosis not present

## 2020-06-04 DIAGNOSIS — F411 Generalized anxiety disorder: Secondary | ICD-10-CM | POA: Diagnosis not present

## 2020-06-16 DIAGNOSIS — F411 Generalized anxiety disorder: Secondary | ICD-10-CM | POA: Diagnosis not present

## 2020-06-26 ENCOUNTER — Encounter: Payer: Self-pay | Admitting: Family Medicine

## 2020-06-26 ENCOUNTER — Ambulatory Visit: Payer: BC Managed Care – PPO | Admitting: Family Medicine

## 2020-07-08 DIAGNOSIS — F411 Generalized anxiety disorder: Secondary | ICD-10-CM | POA: Diagnosis not present

## 2020-07-11 ENCOUNTER — Encounter: Payer: Self-pay | Admitting: Family Medicine

## 2020-07-11 ENCOUNTER — Ambulatory Visit (INDEPENDENT_AMBULATORY_CARE_PROVIDER_SITE_OTHER): Payer: BC Managed Care – PPO | Admitting: Family Medicine

## 2020-07-11 ENCOUNTER — Other Ambulatory Visit: Payer: Self-pay

## 2020-07-11 VITALS — BP 134/82 | HR 82 | Temp 98.3°F | Ht 69.0 in | Wt 319.0 lb

## 2020-07-11 DIAGNOSIS — F988 Other specified behavioral and emotional disorders with onset usually occurring in childhood and adolescence: Secondary | ICD-10-CM

## 2020-07-11 DIAGNOSIS — Z23 Encounter for immunization: Secondary | ICD-10-CM | POA: Diagnosis not present

## 2020-07-11 DIAGNOSIS — R600 Localized edema: Secondary | ICD-10-CM

## 2020-07-11 DIAGNOSIS — E559 Vitamin D deficiency, unspecified: Secondary | ICD-10-CM

## 2020-07-11 DIAGNOSIS — I10 Essential (primary) hypertension: Secondary | ICD-10-CM

## 2020-07-11 DIAGNOSIS — F411 Generalized anxiety disorder: Secondary | ICD-10-CM | POA: Diagnosis not present

## 2020-07-11 NOTE — Progress Notes (Signed)
Subjective:  Patient ID: Brian Alvarez, male    DOB: [birth year]  Age: 34 y.o. MRN: [id number]  CC:  Chief Complaint  Patient presents with  . Follow-up    on hypertension and swollen ankels. PT reports he checks his BP at home with a wrist BP monitor. Pt reports getting readings around120-130/80-95. PT reports no physical symptoms og hypertension. PT states his ankels have beens sollen here and there, but the pt states he has noticed no swelling on days he is more active such as walking around at work and trying to keep his legs elevated.    HPI Brian Alvarez presents for   Hypertension: New diagnosis 8/4 visit. Amlodipine 30m qd started.  Has noticed some ankle swelling if less active. Lower leg. Not noticed prior to med. Less swollen with activity and with leg elevation  Home readings: 120-130/75-90 (takes 10 of readings as that was reading difference in office in past). Wrist meter. Not concerned with intermittent swelling - ok with continuing same med.  Fast food- 4 times per week.  Frozen meals - none.  Restaurant/take out - 1-2 per week.  Has been watching sodium - less vigilant since last visit.   BP Readings from Last 3 Encounters:  07/11/20 (!) 139/93  05/07/20 129/86  04/23/20 (!) 160/96   Lab Results  Component Value Date   CREATININE 1.00 04/23/2020    Anxiety:  With ADHD, suspected dual diagnosis of ADHD/anxiety. Treatment options discussed August 18, decided against medication for anxiety at present time, plan for formal eval for ADD.  Plan to meet with counseling for anxiety.  Option of Wellbutrin or Strattera if nonstimulant approach.  [place]. Has met with counseling - [facility] -  [place]. Going well. Did recommend follow up with attention specialist as ADD may impact anxiety.  Has not called for appt yet.  Still prefers to not start new meds for anxiety at this time   Vitamin D deficiency Over-the-counter  treatment 2000 units/day. Level of 19 on 04/23/20.   History Patient Active Problem List   Diagnosis Date Noted  . Parasomnia, unspecified 10/19/2017  . Allergic asthma due to European house dust mite 10/19/2017  . Confusional arousals 03/28/2017  . PLMD (periodic limb movement disorder) 11/24/2016  . Morbid obesity with body mass index of 45.0-49.9 in adult ([place] 11/24/2016  . Hypersomnia with sleep apnea 11/24/2016  . Snoring 11/24/2016  . Asthma exacerbation 10/19/2016  . Attention deficit hyperactivity disorder (ADHD) 04/29/2016  . Seasonal allergies 09/12/2012  . Asthma 12/26/2011  . Carpal tunnel syndrome 11/11/2011   Past Medical History:  Diagnosis Date  . Allergy   . Anemia   . Asthma   . Carpal tunnel syndrome   . Carpal tunnel syndrome    Past Surgical History:  Procedure Laterality Date  . BACK SURGERY     Allergies  Allergen Reactions  . Penicillins     Hives Has patient had a PCN reaction causing immediate rash, facial/tongue/throat swelling, SOB or lightheadedness with hypotension: yes Has patient had a PCN reaction causing severe rash involving mucus membranes or skin necrosis: yes Has patient had a PCN reaction that required hospitalization: no Has patient had a PCN reaction occurring within the last 10 years: yes If all of the above answers are "NO", then may proceed with Cephalosporin use.  .Marland KitchenRosemary Oil    Prior to Admission medications   Medication Sig Start Date End Date Taking?  Authorizing Provider  albuterol (VENTOLIN HFA) 108 (90 Base) MCG/ACT inhaler INHALE 2 PUFFS INTO THE LUNGS EVERY 4 HOURS AS NEEDED FOR WHEEZING OR SHORTNESS OF BREATH 05/07/20  Yes Wendie Agreste, MD  amLODipine (NORVASC) 5 MG tablet Take 1 tablet (5 mg total) by mouth daily. 04/23/20  Yes Wendie Agreste, MD  EPINEPHrine (EPI-PEN) 0.3 mg/0.3 mL DEVI Inject 0.3 mLs (0.3 mg total) into the muscle once. 10/17/12  Yes Robyn Haber, MD  EPINEPHrine (EPIPEN 2-PAK) 0.3 mg/0.3  mL IJ SOAJ injection Inject 0.3 mLs (0.3 mg total) into the muscle as needed for anaphylaxis. 05/07/20  Yes Wendie Agreste, MD  fluticasone (FLOVENT HFA) 44 MCG/ACT inhaler Inhale 2 puffs into the lungs in the morning and at bedtime. 05/07/20  Yes Wendie Agreste, MD  hydrOXYzine (ATARAX/VISTARIL) 25 MG tablet Take 0.5-1 tablets (12.5-25 mg total) by mouth at bedtime as needed for itching. 04/27/16  Yes Scot Jun, FNP  ibuprofen (ADVIL,MOTRIN) 200 MG tablet Take 600 mg by mouth every 6 (six) hours as needed for headache or moderate pain.    Yes [provider]  levocetirizine (XYZAL) 5 MG tablet Take 5 mg by mouth every evening.   Yes [provider]  Multiple Vitamins-Minerals (MULTIVITAMIN WITH MINERALS) tablet Take 1 tablet by mouth daily.   Yes [provider]  clotrimazole (LOTRIMIN) 1 % cream Apply 1 application topically 2 (two) times daily. Patient not taking: Reported on 07/11/2020 10/02/18   Wendie Agreste, MD  triamcinolone ointment (KENALOG) 0.5 % Apply 1 application topically 2 (two) times daily. Patient not taking: Reported on 07/11/2020 01/13/20   Hall-Potvin, [place], PA-C  amphetamine-dextroamphetamine (ADDERALL) 10 MG tablet Take 1 tablet (10 mg total) by mouth 2 (two) times daily. Patient taking differently: Take 10 mg by mouth daily as needed (focus).  12/03/17 01/13/20  Shawnee Knapp, MD  montelukast (SINGULAIR) 10 MG tablet Take one daily at bedtime Patient taking differently: Take 10 mg by mouth at bedtime.  10/22/13 01/13/20  Darlyne Russian, MD   Social History   Socioeconomic History  . Marital status: Married    Spouse name: Not on file  . Number of children: Not on file  . Years of education: Not on file  . Highest education level: Not on file  Occupational History  . Not on file  Tobacco Use  . Smoking status: Former Research scientist (life sciences)  . Smokeless tobacco: Never Used  . Tobacco comment: has switched to electronic cigarette  Substance and  Sexual Activity  . Alcohol use: Yes    Comment: occassional  . Drug use: No  . Sexual activity: Yes  Other Topics Concern  . Not on file  Social History Narrative  . Not on file   Social Determinants of Health   Financial Resource Strain:   . Difficulty of Paying Living Expenses: Not on file  Food Insecurity:   . Worried About Charity fundraiser in the Last Year: Not on file  . Ran Out of Food in the Last Year: Not on file  Transportation Needs:   . Lack of Transportation (Medical): Not on file  . Lack of Transportation (Non-Medical): Not on file  Physical Activity:   . Days of Exercise per Week: Not on file  . Minutes of Exercise per Session: Not on file  Stress:   . Feeling of Stress : Not on file  Social Connections:   . Frequency of Communication with Friends and Family: Not on file  .  Frequency of Social Gatherings with Friends and Family: Not on file  . Attends Religious Services: Not on file  . Active Member of Clubs or Organizations: Not on file  . Attends Archivist Meetings: Not on file  . Marital Status: Not on file  Intimate Partner Violence:   . Fear of Current or Ex-Partner: Not on file  . Emotionally Abused: Not on file  . Physically Abused: Not on file  . Sexually Abused: Not on file    Review of Systems  Constitutional: Negative for fatigue and unexpected weight change.  Eyes: Negative for visual disturbance.  Respiratory: Negative for cough, chest tightness and shortness of breath.   Cardiovascular: Negative for chest pain, palpitations and leg swelling.  Gastrointestinal: Negative for abdominal pain and blood in stool.  Neurological: Negative for dizziness, light-headedness and headaches.     Objective:   Vitals:   07/11/20 1634  BP: (!) 139/93  Pulse: 82  Temp: 98.3 F (36.8 C)  TempSrc: Temporal  SpO2: 96%  Weight: (!) 319 lb (144.7 kg)  Height: '5\' 9"'  (1.753 m)     Physical Exam Vitals reviewed.  Constitutional:       Appearance: He is well-developed.  HENT:     Head: Normocephalic and atraumatic.  Eyes:     Pupils: Pupils are equal, round, and reactive to light.  Neck:     Vascular: No carotid bruit or JVD.  Cardiovascular:     Rate and Rhythm: Normal rate and regular rhythm.     Heart sounds: Normal heart sounds. No murmur heard.   Pulmonary:     Effort: Pulmonary effort is normal.     Breath sounds: Normal breath sounds. No rales.  Musculoskeletal:     Right lower leg: Edema (Trace to 1+ lower third tibia bilaterally.  No skin changes) present.     Left lower leg: Edema present.  Skin:    General: Skin is warm and dry.  Neurological:     Mental Status: He is alert and oriented to person, place, and time.      Assessment & Plan:  Brian Alvarez is a 34 y.o. male . Essential hypertension Pedal edema  -  Stable. Slight pedal edema.  Declined medication changes at this time.  Sodium avoidance, leg elevation, continue activity as as been helpful.  Recheck 3 months   Need for vaccination - Plan: Flu Vaccine QUAD 36+ mos IM   Anxiety state Attention deficit disorder, unspecified hyperactivity presence  - continue counseling, recommend calling [place] attention specialists, consider nonstimulant approach. deferred anxiety medication at this time.   Vitamin D deficiency - Plan: Vitamin D, 25-hydroxy  - continue vit D supplement. Check labs.     No orders of the defined types were placed in this encounter.  Patient Instructions       If you have lab work done today you will be contacted with your lab results within the next 2 weeks.  If you have not heard from [place] then please contact us. The fastest way to get your results is to register for My Chart.   IF you received an x-ray today, you will receive an invoice from [facility]. Please contact [facility] at [phone] with questions or concerns regarding your invoice.   IF you received labwork today, you will  receive an invoice from [place]. Please contact [facility] at [phone] with questions or concerns regarding your invoice.   Our billing staff will not be able to assist you  with questions regarding bills from these companies.  You will be contacted with the lab results as soon as they are available. The fastest way to get your results is to activate your My Chart account. Instructions are located on the last page of this paperwork. If you have not heard from [place] regarding the results in 2 weeks, please contact this office.         Signed, Merri Ray, MD [facility]

## 2020-07-11 NOTE — Patient Instructions (Addendum)
Okay to continue same dose of amlodipine.  Continue to watch sodium in the diet.  If you do notice continued swelling we certainly can look at other medication options.  Follow-up with [place] attention specialists, continue counseling, and if you would like to try a medication for anxiety or feel like that is not improving I am happy to review some options.  Follow-up in 3 months, but please let me know if there are questions prior to that time.  Peripheral Edema  Peripheral edema is swelling that is caused by a buildup of fluid. Peripheral edema most often affects the lower legs, ankles, and feet. It can also develop in the arms, hands, and face. The area of the body that has peripheral edema will look swollen. It may also feel heavy or warm. Your clothes may start to feel tight. Pressing on the area may make a temporary dent in your skin. You may not be able to move your swollen arm or leg as much as usual. There are many causes of peripheral edema. It can happen because of a complication of other conditions such as congestive heart failure, kidney disease, or a problem with your blood circulation. It also can be a side effect of certain medicines or because of an infection. It often happens to women during pregnancy. Sometimes, the cause is not known. Follow these instructions at home: Managing pain, stiffness, and swelling   Raise (elevate) your legs while you are sitting or lying down.  Move around often to prevent stiffness and to lessen swelling.  Do not sit or stand for long periods of time.  Wear support stockings as told by your health care provider. Medicines  Take over-the-counter and prescription medicines only as told by your health care provider.  Your health care provider may prescribe medicine to help your body get rid of excess water (diuretic). General instructions  Pay attention to any changes in your symptoms.  Follow instructions from your health care provider  about limiting salt (sodium) in your diet. Sometimes, eating less salt may reduce swelling.  Moisturize skin daily to help prevent skin from cracking and draining.  Keep all follow-up visits as told by your health care provider. This is important. Contact a health care provider if you have:  A fever.  Edema that starts suddenly or is getting worse, especially if you are pregnant or have a medical condition.  Swelling in only one leg.  Increased swelling, redness, or pain in one or both of your legs.  Drainage or sores at the area where you have edema. Get help right away if you:  Develop shortness of breath, especially when you are lying down.  Have pain in your chest or abdomen.  Feel weak.  Feel faint. Summary  Peripheral edema is swelling that is caused by a buildup of fluid. Peripheral edema most often affects the lower legs, ankles, and feet.  Move around often to prevent stiffness and to lessen swelling. Do not sit or stand for long periods of time.  Pay attention to any changes in your symptoms.  Contact a health care provider if you have edema that starts suddenly or is getting worse, especially if you are pregnant or have a medical condition.  Get help right away if you develop shortness of breath, especially when lying down. This information is not intended to replace advice given to you by your health care provider. Make sure you discuss any questions you have with your health care provider. Document Revised:  05/31/2018 Document Reviewed: 05/31/2018 Elsevier Patient Education  [employer].   If you have lab work done today you will be contacted with your lab results within the next 2 weeks.  If you have not heard from [place] then please contact us. The fastest way to get your results is to register for My Chart.   IF you received an x-ray today, you will receive an invoice from [facility]. Please contact [facility] at [phone] with  questions or concerns regarding your invoice.   IF you received labwork today, you will receive an invoice from [place]. Please contact [facility] at [phone] with questions or concerns regarding your invoice.   Our billing staff will not be able to assist you with questions regarding bills from these companies.  You will be contacted with the lab results as soon as they are available. The fastest way to get your results is to activate your My Chart account. Instructions are located on the last page of this paperwork. If you have not heard from [place] regarding the results in 2 weeks, please contact this office.

## 2020-07-12 LAB — VITAMIN D 25 HYDROXY (VIT D DEFICIENCY, FRACTURES): Vit D, 25-Hydroxy: 32.1 ng/mL (ref 30.0–100.0)

## 2020-07-23 DIAGNOSIS — F411 Generalized anxiety disorder: Secondary | ICD-10-CM | POA: Diagnosis not present

## 2020-08-07 DIAGNOSIS — F411 Generalized anxiety disorder: Secondary | ICD-10-CM | POA: Diagnosis not present

## 2020-09-08 DIAGNOSIS — F411 Generalized anxiety disorder: Secondary | ICD-10-CM | POA: Diagnosis not present

## 2020-10-01 DIAGNOSIS — F901 Attention-deficit hyperactivity disorder, predominantly hyperactive type: Secondary | ICD-10-CM | POA: Diagnosis not present

## 2020-10-01 DIAGNOSIS — F411 Generalized anxiety disorder: Secondary | ICD-10-CM | POA: Diagnosis not present

## 2020-10-01 DIAGNOSIS — F32A Depression, unspecified: Secondary | ICD-10-CM | POA: Diagnosis not present

## 2020-10-01 DIAGNOSIS — I10 Essential (primary) hypertension: Secondary | ICD-10-CM | POA: Diagnosis not present

## 2020-10-10 ENCOUNTER — Ambulatory Visit: Payer: BC Managed Care – PPO | Admitting: Family Medicine

## 2020-10-10 DIAGNOSIS — F411 Generalized anxiety disorder: Secondary | ICD-10-CM | POA: Diagnosis not present

## 2020-10-20 ENCOUNTER — Other Ambulatory Visit: Payer: Self-pay | Admitting: Family Medicine

## 2020-10-20 DIAGNOSIS — J4520 Mild intermittent asthma, uncomplicated: Secondary | ICD-10-CM

## 2020-10-20 NOTE — Telephone Encounter (Signed)
Requested Prescriptions  Pending Prescriptions Disp Refills  . albuterol (VENTOLIN HFA) 108 (90 Base) MCG/ACT inhaler [Pharmacy Med Name: VENTOLIN HFA INH W/DOS CTR 200PUFFS] 54 g 1    Sig: INHALE 2 PUFFS INTO THE LUNGS EVERY 4 HOURS AS NEEDED FOR WHEEZING OR SHORTNESS OF BREATH     Pulmonology:  Beta Agonists Failed - 10/20/2020  1:24 PM      Failed - One inhaler should last at least one month. If the patient is requesting refills earlier, contact the patient to check for uncontrolled symptoms.      Passed - Valid encounter within last 12 months    Recent Outpatient Visits          3 months ago Essential hypertension   [facility], Asencion Partridge, MD   5 months ago Mild intermittent asthma without complication   [facility], Asencion Partridge, MD   6 months ago Essential hypertension   [facility], Asencion Partridge, MD   2 years ago Right sciatic nerve pain   [facility], Asencion Partridge, MD   2 years ago Strain of lumbar region, initial encounter   [facility], Asencion Partridge, MD

## 2020-11-21 DIAGNOSIS — F411 Generalized anxiety disorder: Secondary | ICD-10-CM | POA: Diagnosis not present

## 2020-12-09 ENCOUNTER — Other Ambulatory Visit: Payer: Self-pay

## 2020-12-09 ENCOUNTER — Encounter: Payer: Self-pay | Admitting: Emergency Medicine

## 2020-12-09 ENCOUNTER — Ambulatory Visit
Admission: EM | Admit: 2020-12-09 | Discharge: 2020-12-09 | Disposition: A | Discharge status: Home / Self Care | Payer: BC Managed Care – PPO | Attending: Emergency Medicine | Admitting: Emergency Medicine

## 2020-12-09 DIAGNOSIS — J111 Influenza due to unidentified influenza virus with other respiratory manifestations: Secondary | ICD-10-CM | POA: Diagnosis not present

## 2020-12-09 DIAGNOSIS — Z20822 Contact with and (suspected) exposure to covid-19: Secondary | ICD-10-CM

## 2020-12-09 MED ORDER — OSELTAMIVIR PHOSPHATE 75 MG PO CAPS: 75.0000 mg | ORAL_CAPSULE | Freq: Two times a day (BID) | ORAL | 0 refills | Status: DC

## 2020-12-09 MED ORDER — FLUTICASONE PROPIONATE 50 MCG/ACT NA SUSP: 2.0000 | Freq: Every day | NASAL | 0 refills | Status: AC

## 2020-12-09 MED ORDER — IBUPROFEN 600 MG PO TABS: 600.0000 mg | ORAL_TABLET | Freq: Four times a day (QID) | ORAL | 0 refills | Status: AC | PRN

## 2020-12-09 NOTE — ED Provider Notes (Signed)
HPI  SUBJECTIVE:  Brian Alvarez is a 35 y.o. male who presents with 2 days of fevers T-max 104, body aches, headaches, nasal congestion, clear rhinorrhea, fatigue, postnasal drip, sore throat, cough secondary to the postnasal drip, sinus pain and pressure.  No loss of sense of smell or taste, shortness of breath, wheezing, nausea,, diarrhea, abdominal pain.  No upper dental pain, facial swelling.  No known COVID or flu exposure.  He had a second dose of the COVID vaccine in February 21.  He also get the flu vaccine.  He has been taking ibuprofen and DayQuil, pushing extra fluids and resting.  Ibuprofen and DayQuil help.  Symptoms are worse with exertion.  He took an antipyretic within 6 hours of evaluation.  No recent antibiotics.  He has a past medical history of asthma and is not needing his albuterol more than usual.  He has a spacer.  He has a history of frequent sinusitis, hypertension, no history of diabetes.  OEU:MPNTIR, Asencion Partridge, MD   Past Medical History:  Diagnosis Date  . Allergy   . Anemia   . Asthma   . Carpal tunnel syndrome   . Carpal tunnel syndrome     Past Surgical History:  Procedure Laterality Date  . BACK SURGERY      Family History  Problem Relation Age of Onset  . Drug abuse Mother   . Heart failure Mother   . Arthritis Maternal Grandmother   . Heart disease Maternal Grandfather     Social History   Tobacco Use  . Smoking status: Former Games developer  . Smokeless tobacco: Never Used  . Tobacco comment: has switched to electronic cigarette  Substance Use Topics  . Alcohol use: Yes    Comment: occassional  . Drug use: No    No current facility-administered medications for this encounter.  Current Outpatient Medications:  .  fluticasone (FLONASE) 50 MCG/ACT nasal spray, Place 2 sprays into both nostrils daily., Disp: 16 g, Rfl: 0 .  ibuprofen (ADVIL) 600 MG tablet, Take 1 tablet (600 mg total) by mouth every 6 (six) hours as needed., Disp: 30 tablet, Rfl: 0 .   oseltamivir (TAMIFLU) 75 MG capsule, Take 1 capsule (75 mg total) by mouth 2 (two) times daily. X 5 days, Disp: 10 capsule, Rfl: 0 .  albuterol (VENTOLIN HFA) 108 (90 Base) MCG/ACT inhaler, INHALE 2 PUFFS INTO THE LUNGS EVERY 4 HOURS AS NEEDED FOR WHEEZING OR SHORTNESS OF BREATH, Disp: 54 g, Rfl: 1 .  amLODipine (NORVASC) 5 MG tablet, Take 1 tablet (5 mg total) by mouth daily., Disp: 90 tablet, Rfl: 1 .  EPINEPHrine (EPI-PEN) 0.3 mg/0.3 mL DEVI, Inject 0.3 mLs (0.3 mg total) into the muscle once., Disp: 2 Device, Rfl: 0 .  EPINEPHrine (EPIPEN 2-PAK) 0.3 mg/0.3 mL IJ SOAJ injection, Inject 0.3 mLs (0.3 mg total) into the muscle as needed for anaphylaxis., Disp: 2 each, Rfl: 0 .  fluticasone (FLOVENT HFA) 44 MCG/ACT inhaler, Inhale 2 puffs into the lungs in the morning and at bedtime., Disp: 1 each, Rfl: 5 .  hydrOXYzine (ATARAX/VISTARIL) 25 MG tablet, Take 0.5-1 tablets (12.5-25 mg total) by mouth at bedtime as needed for itching., Disp: 30 tablet, Rfl: 0 .  Multiple Vitamins-Minerals (MULTIVITAMIN WITH MINERALS) tablet, Take 1 tablet by mouth daily., Disp: , Rfl:   Allergies  Allergen Reactions  . Penicillins     Hives Has patient had a PCN reaction causing immediate rash, facial/tongue/throat swelling, SOB or lightheadedness with hypotension: yes Has patient  had a PCN reaction causing severe rash involving mucus membranes or skin necrosis: yes Has patient had a PCN reaction that required hospitalization: no Has patient had a PCN reaction occurring within the last 10 years: yes If all of the above answers are "NO", then may proceed with Cephalosporin use.  Marland Kitchen Rosemary Oil      ROS  As noted in HPI.   Physical Exam  BP 122/75 (BP Location: Left Arm)   Pulse 82   Temp 98.5 F (36.9 C) (Oral)   Resp 18   SpO2 96%   Constitutional: Well developed, well nourished, no acute distress Eyes:  EOMI, conjunctiva normal bilaterally HENT: Normocephalic, atraumatic,mucus membranes moist.   Positive nasal congestion.  Erythematous, swollen turbinates.  No maxillary, frontal sinus tenderness.  Slightly erythematous oropharynx.  Uvula midline. Neck: No cervical lymphadenopathy Respiratory: Normal inspiratory effort, scattered expiratory wheezing.  No rales, rhonchi. Cardiovascular: Normal rate, regular rhythm, no murmurs rubs or gallops GI: nondistended skin: No rash, skin intact Musculoskeletal: no deformities Neurologic: Alert & oriented x 3, no focal neuro deficits Psychiatric: Speech and behavior appropriate   ED Course   Medications - No data to display  Orders Placed This Encounter  Procedures  . Novel Coronavirus, NAA ([facility])    Standing Status:   Standing    Number of Occurrences:   1  . COVID-19, Flu A+B and RSV ([facility])    Standing Status:   Standing    Number of Occurrences:   1    No results found for this or any previous visit (from the past 24 hour(s)). No results found.  ED Clinical Impression  1. Influenza-like illness   2. Encounter for laboratory testing for COVID-19 virus      ED Assessment/Plan  Patient with upper respiratory illness.  Suspect COVID or flu.  Might Be a candidate for monoclonal antibody for/antiviral treatment if COVID positive due to BMI and history of asthma.  Will send home with Flonase, Mucinex, ibuprofen/Tylenol, saline nasal irrigation, a wait-and-see prescription of Tamiflu if flu is positive.  He states he does not need more albuterol.  He is to take 2 puffs every 4-6 hours using his spacer.  He will also start using his pulse ox.  Work note.  Follow-up with PMD of choice as needed.  To the ER if he gets worse   Discussed MDM, treatment plan, and plan for follow-up with patient. Discussed sn/sx that should prompt return to the ED. patient agrees with plan.   Meds ordered this encounter  Medications  . fluticasone (FLONASE) 50 MCG/ACT nasal spray    Sig: Place 2 sprays into both nostrils daily.    Dispense:  16 g     Refill:  0  . ibuprofen (ADVIL) 600 MG tablet    Sig: Take 1 tablet (600 mg total) by mouth every 6 (six) hours as needed.    Dispense:  30 tablet    Refill:  0  . oseltamivir (TAMIFLU) 75 MG capsule    Sig: Take 1 capsule (75 mg total) by mouth 2 (two) times daily. X 5 days    Dispense:  10 capsule    Refill:  0    *This clinic note was created using Scientist, clinical (histocompatibility and immunogenetics). Therefore, there may be occasional mistakes despite careful proofreading.   ?    Domenick Gong, MD 12/10/20 [phone]

## 2020-12-09 NOTE — ED Triage Notes (Signed)
Pt here for fever and body aches with some cough x 2 days

## 2020-12-09 NOTE — Discharge Instructions (Addendum)
600 mg of ibuprofen, 1000 mg Tylenol together 3-4 times a day as needed for headache, body aches, fevers.  Start Flonase, Mucinex, saline nasal irrigation with a Neil Med rinse and distilled water as often as you want.  Wait-and-see prescription of Tamiflu.  2 puffs from your albuterol inhaler every 4-6 hours using your spacer.  Use your pulse ox and go to the ER if it is consistently below 90%.  If your Covid is positive, then get out and walk every day to prevent pulmonary embolus.  If your flu is positive, then start the Tamiflu.

## 2020-12-10 LAB — COVID-19, FLU A+B AND RSV
Influenza A, NAA: NOT DETECTED
Influenza B, NAA: NOT DETECTED
RSV, NAA: NOT DETECTED
SARS-CoV-2, NAA: NOT DETECTED

## 2020-12-15 DIAGNOSIS — J399 Disease of upper respiratory tract, unspecified: Secondary | ICD-10-CM | POA: Diagnosis not present

## 2020-12-15 DIAGNOSIS — I10 Essential (primary) hypertension: Secondary | ICD-10-CM | POA: Diagnosis not present

## 2020-12-15 DIAGNOSIS — F901 Attention-deficit hyperactivity disorder, predominantly hyperactive type: Secondary | ICD-10-CM | POA: Diagnosis not present

## 2020-12-15 DIAGNOSIS — J45909 Unspecified asthma, uncomplicated: Secondary | ICD-10-CM | POA: Diagnosis not present

## 2020-12-22 DIAGNOSIS — F411 Generalized anxiety disorder: Secondary | ICD-10-CM | POA: Diagnosis not present

## 2021-01-12 DIAGNOSIS — F411 Generalized anxiety disorder: Secondary | ICD-10-CM | POA: Diagnosis not present

## 2021-02-13 DIAGNOSIS — F411 Generalized anxiety disorder: Secondary | ICD-10-CM | POA: Diagnosis not present

## 2021-03-05 DIAGNOSIS — F411 Generalized anxiety disorder: Secondary | ICD-10-CM | POA: Diagnosis not present

## 2021-03-09 DIAGNOSIS — E1165 Type 2 diabetes mellitus with hyperglycemia: Secondary | ICD-10-CM | POA: Diagnosis not present

## 2021-03-09 DIAGNOSIS — F901 Attention-deficit hyperactivity disorder, predominantly hyperactive type: Secondary | ICD-10-CM | POA: Diagnosis not present

## 2021-03-09 DIAGNOSIS — I10 Essential (primary) hypertension: Secondary | ICD-10-CM | POA: Diagnosis not present

## 2021-03-09 DIAGNOSIS — F411 Generalized anxiety disorder: Secondary | ICD-10-CM | POA: Diagnosis not present

## 2021-03-16 DIAGNOSIS — I10 Essential (primary) hypertension: Secondary | ICD-10-CM | POA: Diagnosis not present

## 2021-03-16 DIAGNOSIS — Z79899 Other long term (current) drug therapy: Secondary | ICD-10-CM | POA: Diagnosis not present

## 2021-03-25 DIAGNOSIS — I10 Essential (primary) hypertension: Secondary | ICD-10-CM | POA: Diagnosis not present

## 2021-03-25 DIAGNOSIS — E669 Obesity, unspecified: Secondary | ICD-10-CM | POA: Diagnosis not present

## 2021-03-25 DIAGNOSIS — F411 Generalized anxiety disorder: Secondary | ICD-10-CM | POA: Diagnosis not present

## 2021-03-25 DIAGNOSIS — F901 Attention-deficit hyperactivity disorder, predominantly hyperactive type: Secondary | ICD-10-CM | POA: Diagnosis not present

## 2021-05-01 DIAGNOSIS — F411 Generalized anxiety disorder: Secondary | ICD-10-CM | POA: Diagnosis not present

## 2021-06-04 DIAGNOSIS — F411 Generalized anxiety disorder: Secondary | ICD-10-CM | POA: Diagnosis not present

## 2021-07-01 DIAGNOSIS — F901 Attention-deficit hyperactivity disorder, predominantly hyperactive type: Secondary | ICD-10-CM | POA: Diagnosis not present

## 2021-07-01 DIAGNOSIS — F411 Generalized anxiety disorder: Secondary | ICD-10-CM | POA: Diagnosis not present

## 2021-07-01 DIAGNOSIS — R059 Cough, unspecified: Secondary | ICD-10-CM | POA: Diagnosis not present

## 2021-07-01 DIAGNOSIS — R509 Fever, unspecified: Secondary | ICD-10-CM | POA: Diagnosis not present

## 2021-07-01 DIAGNOSIS — J069 Acute upper respiratory infection, unspecified: Secondary | ICD-10-CM | POA: Diagnosis not present

## 2021-07-13 DIAGNOSIS — Z760 Encounter for issue of repeat prescription: Secondary | ICD-10-CM | POA: Diagnosis not present

## 2021-07-13 DIAGNOSIS — F411 Generalized anxiety disorder: Secondary | ICD-10-CM | POA: Diagnosis not present

## 2021-07-13 DIAGNOSIS — I10 Essential (primary) hypertension: Secondary | ICD-10-CM | POA: Diagnosis not present

## 2021-07-13 DIAGNOSIS — F901 Attention-deficit hyperactivity disorder, predominantly hyperactive type: Secondary | ICD-10-CM | POA: Diagnosis not present

## 2021-07-27 DIAGNOSIS — F411 Generalized anxiety disorder: Secondary | ICD-10-CM | POA: Diagnosis not present

## 2021-08-05 DIAGNOSIS — F411 Generalized anxiety disorder: Secondary | ICD-10-CM | POA: Diagnosis not present

## 2021-08-05 DIAGNOSIS — J45909 Unspecified asthma, uncomplicated: Secondary | ICD-10-CM | POA: Diagnosis not present

## 2021-08-05 DIAGNOSIS — I10 Essential (primary) hypertension: Secondary | ICD-10-CM | POA: Diagnosis not present

## 2021-08-05 DIAGNOSIS — J4541 Moderate persistent asthma with (acute) exacerbation: Secondary | ICD-10-CM | POA: Diagnosis not present

## 2021-08-05 DIAGNOSIS — R509 Fever, unspecified: Secondary | ICD-10-CM | POA: Diagnosis not present

## 2021-08-05 DIAGNOSIS — J22 Unspecified acute lower respiratory infection: Secondary | ICD-10-CM | POA: Diagnosis not present

## 2021-08-06 ENCOUNTER — Ambulatory Visit
Admission: RE | Admit: 2021-08-06 | Discharge: 2021-08-06 | Disposition: A | Discharge status: Home / Self Care | Payer: BC Managed Care – PPO | Source: Ambulatory Visit | Attending: Family Medicine | Admitting: Family Medicine

## 2021-08-06 ENCOUNTER — Other Ambulatory Visit: Payer: Self-pay | Admitting: Family Medicine

## 2021-08-06 DIAGNOSIS — J3489 Other specified disorders of nose and nasal sinuses: Secondary | ICD-10-CM

## 2021-08-06 DIAGNOSIS — R059 Cough, unspecified: Secondary | ICD-10-CM

## 2021-08-10 DIAGNOSIS — R059 Cough, unspecified: Secondary | ICD-10-CM | POA: Diagnosis not present

## 2021-08-10 DIAGNOSIS — I10 Essential (primary) hypertension: Secondary | ICD-10-CM | POA: Diagnosis not present

## 2021-08-10 DIAGNOSIS — J328 Other chronic sinusitis: Secondary | ICD-10-CM | POA: Diagnosis not present

## 2021-08-10 DIAGNOSIS — J45909 Unspecified asthma, uncomplicated: Secondary | ICD-10-CM | POA: Diagnosis not present

## 2021-08-28 DIAGNOSIS — F411 Generalized anxiety disorder: Secondary | ICD-10-CM | POA: Diagnosis not present

## 2021-08-31 DIAGNOSIS — J4520 Mild intermittent asthma, uncomplicated: Secondary | ICD-10-CM | POA: Diagnosis not present

## 2021-08-31 DIAGNOSIS — F901 Attention-deficit hyperactivity disorder, predominantly hyperactive type: Secondary | ICD-10-CM | POA: Diagnosis not present

## 2021-08-31 DIAGNOSIS — J349 Unspecified disorder of nose and nasal sinuses: Secondary | ICD-10-CM | POA: Diagnosis not present

## 2021-08-31 DIAGNOSIS — I10 Essential (primary) hypertension: Secondary | ICD-10-CM | POA: Diagnosis not present

## 2021-09-18 DIAGNOSIS — F411 Generalized anxiety disorder: Secondary | ICD-10-CM | POA: Diagnosis not present

## 2021-10-04 DIAGNOSIS — S139XXA Sprain of joints and ligaments of unspecified parts of neck, initial encounter: Secondary | ICD-10-CM | POA: Diagnosis not present

## 2021-10-08 DIAGNOSIS — F411 Generalized anxiety disorder: Secondary | ICD-10-CM | POA: Diagnosis not present

## 2021-10-29 DIAGNOSIS — F411 Generalized anxiety disorder: Secondary | ICD-10-CM | POA: Diagnosis not present

## 2021-12-04 DIAGNOSIS — F411 Generalized anxiety disorder: Secondary | ICD-10-CM | POA: Diagnosis not present

## 2021-12-21 DIAGNOSIS — K047 Periapical abscess without sinus: Secondary | ICD-10-CM | POA: Diagnosis not present

## 2022-03-08 ENCOUNTER — Ambulatory Visit
Admission: EM | Admit: 2022-03-08 | Discharge: 2022-03-08 | Discharge status: Absent without leave | Payer: BC Managed Care – PPO

## 2022-03-08 ENCOUNTER — Ambulatory Visit
Admission: RE | Admit: 2022-03-08 | Discharge: 2022-03-08 | Disposition: A | Discharge status: Home / Self Care | Payer: BC Managed Care – PPO | Source: Ambulatory Visit | Attending: Internal Medicine | Admitting: Internal Medicine

## 2022-03-08 VITALS — BP 113/64 | HR 130 | Temp 102.0°F | Resp 18

## 2022-03-08 DIAGNOSIS — A084 Viral intestinal infection, unspecified: Secondary | ICD-10-CM | POA: Diagnosis not present

## 2022-03-08 MED ORDER — ACETAMINOPHEN 325 MG PO TABS
650.0000 mg | ORAL_TABLET | Freq: Once | ORAL | Status: AC
  Administered 2022-03-08: 650 mg via ORAL

## 2022-03-08 MED ORDER — SODIUM CHLORIDE 0.9 % IV BOLUS: 1000.0000 mL | Freq: Once | INTRAVENOUS | Status: DC

## 2022-03-08 MED ORDER — ONDANSETRON 4 MG PO TBDP: 4.0000 mg | ORAL_TABLET | Freq: Three times a day (TID) | ORAL | 0 refills | Status: DC | PRN

## 2022-03-08 NOTE — Discharge Instructions (Signed)
Increase oral fluid intake Take medications as prescribed Return to urgent care if symptoms worsen 

## 2022-03-08 NOTE — ED Provider Notes (Signed)
[name]    CSN: [id number] Arrival date & time: 03/08/22  1055      History   Chief Complaint Chief Complaint  Patient presents with   Diarrhea    Vomiting, diarrhea, achy joints, chills, on-and-off fever up to 102.6 - Entered by patient    HPI Brian Alvarez is a 36 y.o. male comes to the urgent care with a 1 day history of generalized body aches, general malaise, nausea and diarrhea.  Symptoms started fairly abruptly and has been persistent.  He has had 8-9 watery bowel movements overnight.  Crampy abdominal pain.  No rectal spasms.  No sick contacts.  No cough or sputum production.  No sore throat.  Appetite is decreased secondary to nausea.  Patient did home COVID test which was negative. HPI  Past Medical History:  Diagnosis Date   Allergy    Anemia    Asthma    Carpal tunnel syndrome    Carpal tunnel syndrome     Patient Active Problem List   Diagnosis Date Noted   Parasomnia, unspecified 10/19/2017   Allergic asthma due to European house dust mite 10/19/2017   Confusional arousals 03/28/2017   PLMD (periodic limb movement disorder) 11/24/2016   Morbid obesity with body mass index of 45.0-49.9 in adult [facility]) 11/24/2016   Hypersomnia with sleep apnea 11/24/2016   Snoring 11/24/2016   Asthma exacerbation 10/19/2016   Attention deficit hyperactivity disorder (ADHD) 04/29/2016   Seasonal allergies 09/12/2012   Asthma 12/26/2011   Carpal tunnel syndrome 11/11/2011    Past Surgical History:  Procedure Laterality Date   BACK SURGERY         Home Medications    Prior to Admission medications   Medication Sig Start Date End Date Taking? Authorizing Provider  ondansetron (ZOFRAN-ODT) 4 MG disintegrating tablet Take 1 tablet (4 mg total) by mouth every 8 (eight) hours as needed for nausea or vomiting. 03/08/22  Yes Dareld Mcauliffe, Britta Mccreedy, MD  albuterol (VENTOLIN HFA) 108 (90 Base) MCG/ACT inhaler INHALE 2 PUFFS INTO THE LUNGS EVERY 4 HOURS AS NEEDED FOR  WHEEZING OR SHORTNESS OF BREATH 10/20/20   Shade Flood, MD  amLODipine (NORVASC) 5 MG tablet Take 1 tablet (5 mg total) by mouth daily. 04/23/20   Shade Flood, MD  EPINEPHrine (EPI-PEN) 0.3 mg/0.3 mL DEVI Inject 0.3 mLs (0.3 mg total) into the muscle once. 10/17/12   Elvina Sidle, MD  EPINEPHrine (EPIPEN 2-PAK) 0.3 mg/0.3 mL IJ SOAJ injection Inject 0.3 mLs (0.3 mg total) into the muscle as needed for anaphylaxis. 05/07/20   Shade Flood, MD  fluticasone (FLONASE) 50 MCG/ACT nasal spray Place 2 sprays into both nostrils daily. 12/09/20   Domenick Gong, MD  fluticasone (FLOVENT HFA) 44 MCG/ACT inhaler Inhale 2 puffs into the lungs in the morning and at bedtime. 05/07/20   Shade Flood, MD  hydrOXYzine (ATARAX/VISTARIL) 25 MG tablet Take 0.5-1 tablets (12.5-25 mg total) by mouth at bedtime as needed for itching. 04/27/16   Bing Neighbors, FNP  ibuprofen (ADVIL) 600 MG tablet Take 1 tablet (600 mg total) by mouth every 6 (six) hours as needed. 12/09/20   Domenick Gong, MD  Multiple Vitamins-Minerals (MULTIVITAMIN WITH MINERALS) tablet Take 1 tablet by mouth daily.    [provider]  amphetamine-dextroamphetamine (ADDERALL) 10 MG tablet Take 1 tablet (10 mg total) by mouth 2 (two) times daily. Patient taking differently: Take 10 mg by mouth daily as needed (focus).  12/03/17 01/13/20  Norberto Sorenson  N, MD  levocetirizine (XYZAL) 5 MG tablet Take 5 mg by mouth every evening.  12/09/20  [provider]  montelukast (SINGULAIR) 10 MG tablet Take one daily at bedtime Patient taking differently: Take 10 mg by mouth at bedtime.  10/22/13 01/13/20  Collene Gobble, MD    Family History Family History  Problem Relation Age of Onset   Drug abuse Mother    Heart failure Mother    Arthritis Maternal Grandmother    Heart disease Maternal Grandfather     Social History Social History   Tobacco Use   Smoking status: Former   Smokeless tobacco: Never   Tobacco comments:     has switched to electronic cigarette  Substance Use Topics   Alcohol use: Yes    Comment: occassional   Drug use: No     Allergies   Penicillins and Rosemary oil   Review of Systems Review of Systems  Constitutional:  Positive for activity change, chills, fatigue and fever.  HENT:  Negative for congestion and sore throat.   Cardiovascular: Negative.   Gastrointestinal:  Positive for abdominal pain, diarrhea and nausea. Negative for vomiting.  Musculoskeletal: Negative.   Skin: Negative.      Physical Exam Triage Vital Signs ED Triage Vitals  Enc Vitals Group     BP 03/08/22 1107 113/64     Pulse Rate 03/08/22 1107 (!) 130     Resp 03/08/22 1107 18     Temp 03/08/22 1107 (!) 102 F (38.9 C)     Temp Source 03/08/22 1107 Oral     SpO2 03/08/22 1107 95 %     Weight --      Height --      Head Circumference --      Peak Flow --      Pain Score 03/08/22 1108 0     Pain Loc --      Pain Edu? --      Excl. in GC? --    No data found.  Updated Vital Signs BP 113/64 (BP Location: Right Arm)   Pulse (!) 130   Temp (!) 102 F (38.9 C) (Oral)   Resp 18   SpO2 95%   Visual Acuity Right Eye Distance:   Left Eye Distance:   Bilateral Distance:    Right Eye Near:   Left Eye Near:    Bilateral Near:     Physical Exam Vitals and nursing note reviewed.  Constitutional:      General: He is not in acute distress.    Appearance: He is not ill-appearing.  HENT:     Right Ear: Tympanic membrane normal.     Left Ear: Tympanic membrane normal.  Cardiovascular:     Rate and Rhythm: Normal rate and regular rhythm.     Pulses: Normal pulses.     Heart sounds: Normal heart sounds.  Pulmonary:     Effort: Pulmonary effort is normal.     Breath sounds: Normal breath sounds.  Abdominal:     General: Bowel sounds are normal.     Palpations: Abdomen is soft.  Musculoskeletal:        General: Normal range of motion.  Neurological:     Mental Status: He is alert.       UC Treatments / Results  Labs (all labs ordered are listed, but only abnormal results are displayed) Labs Reviewed - No data to display  EKG   Radiology No results found.  Procedures Procedures (including  critical care time)  Medications Ordered in UC Medications  acetaminophen (TYLENOL) tablet 650 mg (650 mg Oral Given 03/08/22 1110)    Initial Impression / Assessment and Plan / UC Course  I have reviewed the triage vital signs and the nursing notes.  Pertinent labs & imaging results that were available during my care of the patient were reviewed by me and considered in my medical decision making (see chart for details).     1.  Acute viral gastroenteritis: Increase oral fluid intake Zofran as needed for nausea/vomiting Tylenol/Motrin as needed for pain and/or fever Increase oral fluid intake Return to urgent care if symptoms worsen. Final Clinical Impressions(s) / UC Diagnoses   Final diagnoses:  Viral gastroenteritis     Discharge Instructions      Increase oral fluid intake Take medications as prescribed Return to urgent care if symptoms worsen.   ED Prescriptions     Medication Sig Dispense Auth. Provider   ondansetron (ZOFRAN-ODT) 4 MG disintegrating tablet Take 1 tablet (4 mg total) by mouth every 8 (eight) hours as needed for nausea or vomiting. 20 tablet Nelline Lio, Britta Mccreedy, MD      PDMP not reviewed this encounter.   Merrilee Jansky, MD 03/08/22 [phone]

## 2022-03-08 NOTE — ED Triage Notes (Signed)
Pt c/o fever, chills, nausea, diarrhea, abd cramping onset Sunday

## 2022-03-11 ENCOUNTER — Other Ambulatory Visit (HOSPITAL_BASED_OUTPATIENT_CLINIC_OR_DEPARTMENT_OTHER): Payer: Self-pay

## 2022-03-11 ENCOUNTER — Emergency Department (HOSPITAL_BASED_OUTPATIENT_CLINIC_OR_DEPARTMENT_OTHER): Payer: BC Managed Care – PPO

## 2022-03-11 ENCOUNTER — Encounter (HOSPITAL_BASED_OUTPATIENT_CLINIC_OR_DEPARTMENT_OTHER): Payer: Self-pay | Admitting: Emergency Medicine

## 2022-03-11 ENCOUNTER — Other Ambulatory Visit: Payer: Self-pay

## 2022-03-11 ENCOUNTER — Ambulatory Visit
Admission: EM | Admit: 2022-03-11 | Discharge: 2022-03-11 | Disposition: A | Discharge status: Home / Self Care | Payer: BC Managed Care – PPO

## 2022-03-11 ENCOUNTER — Emergency Department (HOSPITAL_BASED_OUTPATIENT_CLINIC_OR_DEPARTMENT_OTHER)
Admission: EM | Admit: 2022-03-11 | Discharge: 2022-03-11 | Disposition: A | Discharge status: Home / Self Care | Payer: BC Managed Care – PPO | Attending: Emergency Medicine | Admitting: Emergency Medicine

## 2022-03-11 DIAGNOSIS — R1084 Generalized abdominal pain: Secondary | ICD-10-CM | POA: Insufficient documentation

## 2022-03-11 DIAGNOSIS — R509 Fever, unspecified: Secondary | ICD-10-CM | POA: Insufficient documentation

## 2022-03-11 DIAGNOSIS — R197 Diarrhea, unspecified: Secondary | ICD-10-CM

## 2022-03-11 DIAGNOSIS — R112 Nausea with vomiting, unspecified: Secondary | ICD-10-CM | POA: Diagnosis not present

## 2022-03-11 DIAGNOSIS — K6389 Other specified diseases of intestine: Secondary | ICD-10-CM | POA: Diagnosis not present

## 2022-03-11 LAB — CBC WITH DIFFERENTIAL/PLATELET
Abs Immature Granulocytes: 0.03 10*3/uL (ref 0.00–0.07)
Basophils Absolute: 0 10*3/uL (ref 0.0–0.1)
Basophils Relative: 1 %
Eosinophils Absolute: 0.1 10*3/uL (ref 0.0–0.5)
Eosinophils Relative: 1 %
HCT: 41.5 % (ref 39.0–52.0)
Hemoglobin: 13.8 g/dL (ref 13.0–17.0)
Immature Granulocytes: 0 %
Lymphocytes Relative: 21 %
Lymphs Abs: 1.5 10*3/uL (ref 0.7–4.0)
MCH: 28 pg (ref 26.0–34.0)
MCHC: 33.3 g/dL (ref 30.0–36.0)
MCV: 84.3 fL (ref 80.0–100.0)
Monocytes Absolute: 0.9 10*3/uL (ref 0.1–1.0)
Monocytes Relative: 13 %
Neutro Abs: 4.5 10*3/uL (ref 1.7–7.7)
Neutrophils Relative %: 64 %
Platelets: 258 10*3/uL (ref 150–400)
RBC: 4.92 MIL/uL (ref 4.22–5.81)
RDW: 12.4 % (ref 11.5–15.5)
WBC: 7.2 10*3/uL (ref 4.0–10.5)
nRBC: 0 % (ref 0.0–0.2)

## 2022-03-11 LAB — COMPREHENSIVE METABOLIC PANEL
ALT: 23 U/L (ref 0–44)
AST: 18 U/L (ref 15–41)
Albumin: 4 g/dL (ref 3.5–5.0)
Alkaline Phosphatase: 50 U/L (ref 38–126)
Anion gap: 13 (ref 5–15)
BUN: 10 mg/dL (ref 6–20)
CO2: 26 mmol/L (ref 22–32)
Calcium: 9.6 mg/dL (ref 8.9–10.3)
Chloride: 100 mmol/L (ref 98–111)
Creatinine, Ser: 1.01 mg/dL (ref 0.61–1.24)
GFR, Estimated: >60 mL/min (ref >60)
Glucose, Bld: 97 mg/dL (ref 70–99)
Potassium: 3.9 mmol/L (ref 3.5–5.1)
Sodium: 139 mmol/L (ref 135–145)
Total Bilirubin: 0.4 mg/dL (ref 0.3–1.2)
Total Protein: 7.2 g/dL (ref 6.5–8.1)

## 2022-03-11 LAB — OCCULT BLOOD X 1 CARD TO LAB, STOOL: Fecal Occult Bld: NEGATIVE

## 2022-03-11 LAB — C DIFFICILE QUICK SCREEN W PCR REFLEX
C Diff antigen: NEGATIVE
C Diff interpretation: NOT DETECTED
C Diff toxin: NEGATIVE

## 2022-03-11 LAB — LIPASE, BLOOD: Lipase: 31 U/L (ref 11–51)

## 2022-03-11 MED ORDER — SODIUM CHLORIDE 0.9 % IV BOLUS
1000.0000 mL | Freq: Once | INTRAVENOUS | Status: AC
  Administered 2022-03-11: 1000 mL via INTRAVENOUS

## 2022-03-11 MED ORDER — SODIUM CHLORIDE 0.9 % IV BOLUS
1000.0000 mL | Freq: Once | INTRAVENOUS | Status: AC
Start: 2022-03-11 — End: 2022-03-11
  Administered 2022-03-11: 1000 mL via INTRAVENOUS

## 2022-03-11 MED ORDER — METRONIDAZOLE 500 MG PO TABS
500.0000 mg | ORAL_TABLET | Freq: Two times a day (BID) | ORAL | 0 refills | Status: DC
  Filled 2022-03-11: qty 10, 5d supply, fill #0

## 2022-03-11 MED ORDER — CIPROFLOXACIN HCL 500 MG PO TABS
500.0000 mg | ORAL_TABLET | Freq: Two times a day (BID) | ORAL | 0 refills | Status: DC
  Filled 2022-03-11: qty 10, 5d supply, fill #0

## 2022-03-11 MED ORDER — IOHEXOL 300 MG/ML  SOLN
100.0000 mL | Freq: Once | INTRAMUSCULAR | Status: AC | PRN
  Administered 2022-03-11: 100 mL via INTRAVENOUS

## 2022-03-11 NOTE — Discharge Instructions (Signed)
Please go to the emergency department as soon as you leave urgent care for further evaluation and management. ?

## 2022-03-11 NOTE — ED Triage Notes (Signed)
Patient arrives ambulatory by POV c/o persistent diarrhea x 5 days. States the first 3 days had a fever that subsided. Reports pain when having BM.

## 2022-03-11 NOTE — ED Notes (Signed)
Discharge instructions, follow up care, and prescriptions reviewed and explained, pt verbalized understanding. Pt caox4 and ambulatory upon d/c.

## 2022-03-11 NOTE — Discharge Instructions (Signed)
Your testing is reassuring.  Take the antibiotics as prescribed.  Follow-up your stool studies in the MyChart application tomorrow.  If C. difficile study is negative you may use Imodium.  If C. difficile study is positive do not use Imodium and stop taking the antibiotics. Return to the ED with worsening pain, worsening diarrhea, fever, not able to eat or drink, black or bloody stools or any other concerns.

## 2022-03-11 NOTE — ED Provider Notes (Signed)
[name]    CSN: [id number] Arrival date & time: 03/11/22  1215      History   Chief Complaint No chief complaint on file.   HPI Brian Alvarez is a 36 y.o. male.   Patient presents with persistent diarrhea that has been present for approximately 5 days.  Patient originally presented on 6/19 with same symptoms.  He originally had other associated symptoms including nausea and vomiting but reports that those symptoms are resolved and diarrhea and decreased appetite are still persistent.  He reports that his stool has been "strawberry colored" the past 3 times with last one being this morning.  Patient does have intermittent abdominal pain that is mostly associated with having to go to the bathroom.  Denies any associated fevers.  Denies any known sick contacts.  Patient reports that he is drinking lots of fluids.     Past Medical History:  Diagnosis Date   Allergy    Anemia    Asthma    Carpal tunnel syndrome    Carpal tunnel syndrome     Patient Active Problem List   Diagnosis Date Noted   Parasomnia, unspecified 10/19/2017   Allergic asthma due to European house dust mite 10/19/2017   Confusional arousals 03/28/2017   PLMD (periodic limb movement disorder) 11/24/2016   Morbid obesity with body mass index of 45.0-49.9 in adult [facility]) 11/24/2016   Hypersomnia with sleep apnea 11/24/2016   Snoring 11/24/2016   Asthma exacerbation 10/19/2016   Attention deficit hyperactivity disorder (ADHD) 04/29/2016   Seasonal allergies 09/12/2012   Asthma 12/26/2011   Carpal tunnel syndrome 11/11/2011    Past Surgical History:  Procedure Laterality Date   BACK SURGERY         Home Medications    Prior to Admission medications   Medication Sig Start Date End Date Taking? Authorizing Provider  albuterol (VENTOLIN HFA) 108 (90 Base) MCG/ACT inhaler INHALE 2 PUFFS INTO THE LUNGS EVERY 4 HOURS AS NEEDED FOR WHEEZING OR SHORTNESS OF BREATH 10/20/20   Shade Flood,  MD  amLODipine (NORVASC) 5 MG tablet Take 1 tablet (5 mg total) by mouth daily. 04/23/20   Shade Flood, MD  EPINEPHrine (EPI-PEN) 0.3 mg/0.3 mL DEVI Inject 0.3 mLs (0.3 mg total) into the muscle once. 10/17/12   Elvina Sidle, MD  EPINEPHrine (EPIPEN 2-PAK) 0.3 mg/0.3 mL IJ SOAJ injection Inject 0.3 mLs (0.3 mg total) into the muscle as needed for anaphylaxis. 05/07/20   Shade Flood, MD  fluticasone (FLONASE) 50 MCG/ACT nasal spray Place 2 sprays into both nostrils daily. 12/09/20   Domenick Gong, MD  fluticasone (FLOVENT HFA) 44 MCG/ACT inhaler Inhale 2 puffs into the lungs in the morning and at bedtime. 05/07/20   Shade Flood, MD  hydrOXYzine (ATARAX/VISTARIL) 25 MG tablet Take 0.5-1 tablets (12.5-25 mg total) by mouth at bedtime as needed for itching. 04/27/16   Bing Neighbors, FNP  ibuprofen (ADVIL) 600 MG tablet Take 1 tablet (600 mg total) by mouth every 6 (six) hours as needed. 12/09/20   Domenick Gong, MD  Multiple Vitamins-Minerals (MULTIVITAMIN WITH MINERALS) tablet Take 1 tablet by mouth daily.    [provider]  ondansetron (ZOFRAN-ODT) 4 MG disintegrating tablet Take 1 tablet (4 mg total) by mouth every 8 (eight) hours as needed for nausea or vomiting. 03/08/22   Lamptey, Britta Mccreedy, MD  amphetamine-dextroamphetamine (ADDERALL) 10 MG tablet Take 1 tablet (10 mg total) by mouth 2 (two) times daily. Patient taking differently: Take  10 mg by mouth daily as needed (focus).  12/03/17 01/13/20  Sherren Mocha, MD  levocetirizine (XYZAL) 5 MG tablet Take 5 mg by mouth every evening.  12/09/20  [provider]  montelukast (SINGULAIR) 10 MG tablet Take one daily at bedtime Patient taking differently: Take 10 mg by mouth at bedtime.  10/22/13 01/13/20  Collene Gobble, MD    Family History Family History  Problem Relation Age of Onset   Drug abuse Mother    Heart failure Mother    Arthritis Maternal Grandmother    Heart disease Maternal Grandfather      Social History Social History   Tobacco Use   Smoking status: Former   Smokeless tobacco: Never   Tobacco comments:    has switched to electronic cigarette  Substance Use Topics   Alcohol use: Yes    Comment: occassional   Drug use: No     Allergies   Penicillins and Rosemary oil   Review of Systems Review of Systems Per HPI  Physical Exam Triage Vital Signs ED Triage Vitals  Enc Vitals Group     BP 03/11/22 1244 102/70     Pulse Rate 03/11/22 1244 87     Resp 03/11/22 1244 18     Temp 03/11/22 1244 98.6 F (37 C)     Temp Source 03/11/22 1244 Oral     SpO2 03/11/22 1244 95 %     Weight --      Height --      Head Circumference --      Peak Flow --      Pain Score 03/11/22 1243 3     Pain Loc --      Pain Edu? --      Excl. in GC? --    No data found.  Updated Vital Signs BP 102/70 (BP Location: Right Arm)   Pulse 87   Temp 98.6 F (37 C) (Oral)   Resp 18   SpO2 95%   Visual Acuity Right Eye Distance:   Left Eye Distance:   Bilateral Distance:    Right Eye Near:   Left Eye Near:    Bilateral Near:     Physical Exam Constitutional:      General: He is not in acute distress.    Appearance: Normal appearance. He is not toxic-appearing or diaphoretic.  HENT:     Head: Normocephalic and atraumatic.     Mouth/Throat:     Mouth: Mucous membranes are moist.     Pharynx: No posterior oropharyngeal erythema.  Eyes:     Extraocular Movements: Extraocular movements intact.     Conjunctiva/sclera: Conjunctivae normal.  Cardiovascular:     Rate and Rhythm: Normal rate and regular rhythm.     Pulses: Normal pulses.     Heart sounds: Normal heart sounds.  Pulmonary:     Effort: Pulmonary effort is normal.     Breath sounds: Normal breath sounds.  Abdominal:     General: Bowel sounds are normal. There is no distension.     Palpations: Abdomen is soft.     Tenderness: There is no abdominal tenderness.  Neurological:     General: No focal  deficit present.     Mental Status: He is alert and oriented to person, place, and time. Mental status is at baseline.  Psychiatric:        Mood and Affect: Mood normal.        Behavior: Behavior normal.  Thought Content: Thought content normal.        Judgment: Judgment normal.      UC Treatments / Results  Labs (all labs ordered are listed, but only abnormal results are displayed) Labs Reviewed - No data to display  EKG   Radiology No results found.  Procedures Procedures (including critical care time)  Medications Ordered in UC Medications - No data to display  Initial Impression / Assessment and Plan / UC Course  I have reviewed the triage vital signs and the nursing notes.  Pertinent labs & imaging results that were available during my care of the patient were reviewed by me and considered in my medical decision making (see chart for details).     There is concern for GI bleed or other complications given the patient has reported "strawberry colored stool".  I do think this warrants further evaluation and management the hospital as I do not have the ability to do stat blood work here.  Patient was advised that he will need to go to hospital for further evaluation and management.  Patient was agreeable with plan.  Vital signs stable at discharge.  Agree with patient self transport to hospital. Final Clinical Impressions(s) / UC Diagnoses   Final diagnoses:  Diarrhea, unspecified type     Discharge Instructions      Please go to the emergency department as soon as you leave urgent care for further evaluation and management.    ED Prescriptions   None    PDMP not reviewed this encounter.   Gustavus Bryant, [place] 03/11/22 1258

## 2022-03-11 NOTE — ED Triage Notes (Signed)
Pt present diarrhea, symptom started 5 days ago. Pt was recently seen on Monday for the different symptoms but diarrhea was one and has yet to subsided.

## 2022-03-11 NOTE — ED Notes (Signed)
Water and ginger ale provided at the bedside.

## 2022-03-12 ENCOUNTER — Telehealth (HOSPITAL_BASED_OUTPATIENT_CLINIC_OR_DEPARTMENT_OTHER): Payer: Self-pay | Admitting: *Deleted

## 2022-03-12 ENCOUNTER — Encounter: Payer: Self-pay | Admitting: Physician Assistant

## 2022-03-12 LAB — GASTROINTESTINAL PANEL BY PCR, STOOL (REPLACES STOOL CULTURE)
Adenovirus F40/41: NOT DETECTED
Astrovirus: NOT DETECTED
Campylobacter species: DETECTED — AB
Cryptosporidium: NOT DETECTED
Cyclospora cayetanensis: NOT DETECTED
Entamoeba histolytica: NOT DETECTED
Enteroaggregative E coli (EAEC): DETECTED — AB
Enteropathogenic E coli (EPEC): NOT DETECTED
Enterotoxigenic E coli (ETEC): NOT DETECTED
Giardia lamblia: NOT DETECTED
Norovirus GI/GII: NOT DETECTED
Plesimonas shigelloides: NOT DETECTED
Rotavirus A: NOT DETECTED
Salmonella species: NOT DETECTED
Sapovirus (I, II, IV, and V): NOT DETECTED
Shiga like toxin producing E coli (STEC): NOT DETECTED
Shigella/Enteroinvasive E coli (EIEC): NOT DETECTED
Vibrio cholerae: NOT DETECTED
Vibrio species: NOT DETECTED
Yersinia enterocolitica: NOT DETECTED

## 2022-04-06 NOTE — Progress Notes (Addendum)
04/08/2022 Criss Pallone [id number] May 21, 1986  Referring provider: No ref. provider found Primary GI doctor: Dr. Lyndel Safe  ASSESSMENT : 1. Rectal bleeding   2. Diarrhea of infectious origin   3. Positive fecal occult blood test    Patient with infectious diarrhea diagnosed a month ago, continuing diarrhea with some rectal bleeding and rectal pain. CT showed likely infectious colitis from sigmoid to rectum Slightly abnormal exam today with very tender rectal exam, FOBT positive, possible mucus versus what appeared to be more like pus. 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  PLAN: Will schedule colonoscopy We have discussed the risks of bleeding, infection, perforation, medication reactions, and remote risk of death associated with colonoscopy. All questions were answered and the patient acknowledges these risk and wishes to proceed. -Sitz baths, increase fiber, increase water, need to fix toilet habits.  -Hydrocortisone supp give and external cream sent in.  -We discussed hemorrhoid banding here in the office or surgical treatment if hemorrhoids do not improve with conservative treatment. - follow up for evaluation  Orders Placed This Encounter  Procedures   CBC with Differential/Platelet   Comprehensive metabolic panel   High sensitivity CRP   Sedimentation rate   Ambulatory referral to Gastroenterology   Meds ordered this encounter  Medications   hydrocortisone (ANUSOL-HC) 25 MG suppository    Sig: Place 1 suppository (25 mg total) rectally 2 (two) times daily.    Dispense:  12 suppository    Refill:  0   hydrocortisone (ANUSOL-HC) 2.5 % rectal cream    Sig: Place 1 Application rectally 2 (two) times daily.    Dispense:  30 g    Refill:  2   Na Sulfate-K Sulfate-Mg Sulf 17.5-3.13-1.6 GM/177ML SOLN    Sig: Take 1 kit  by mouth once for 1 dose.    Dispense:  324 mL    Refill:  0    Patient Care Team: Pcp, No as PCP - General  HISTORY OF PRESENT ILLNESS: 36 y.o. male with a past medical history of hypertension, asthma, allergies and others listed below presents for evaluation of persistent diarrhea.   Patient's been having diarrhea with nausea vomiting and fever since June 18.  Went to urgent care diagnosed with gastroenteritis given Zofran without any help.   Patient having brown stools with some bright red blood 03/11/2022 ER visit Hemoccult negative, normal creatinine, normal electrolytes, normal lipase and hepatic function. CT scan showed thickening of sigmoid and rectum with some stranding consistent with inflammatory or infectious colitis.   Given Cipro and Flagyl. Positive enteroaggregative E coli and campylobacter, negative Cdiff Wife with UC, has some concern for this.   He states prior to this he would have 4 BM's a day, formed.  No loose stools or BRB in the past.  Was having BM every 20-30 mins, straight water.  Had 1-2 episodes BRB with rectal pain with burning, felt like he was "cut". This has improved.  No AB pain. No nausea or vomiting.  Since this he has had about 10-13 lbs weight loss in a month.  GM with thyroid, but no family history of GI malignancy or autoimmune.  States he is overall better, appetite has returned.  Now having well formed, small BM's. Having 6 times a day.  No AB pain, some rectal pain still but improved.   03/11/2022 CT AB and pelvis  with contrast for diarrhea   Has 60 year old daughter, wife stays at home with her for now.  Wt Readings from Last 3 Encounters:  04/08/22 (!) 303 lb 4 oz (137.6 kg)  03/11/22 (!) 319 lb (144.7 kg)  07/11/20 (!) 319 lb (144.7 kg)    Current Medications:    Current Outpatient Medications (Cardiovascular):    amLODipine (NORVASC) 5 MG tablet, Take 1 tablet (5 mg total) by mouth daily.   olmesartan-hydrochlorothiazide  (BENICAR HCT) 20-12.5 MG tablet, Take 1 tablet by mouth daily.   EPINEPHrine (EPIPEN 2-PAK) 0.3 mg/0.3 mL IJ SOAJ injection, Inject 0.3 mLs (0.3 mg total) into the muscle as needed for anaphylaxis. (Patient not taking: Reported on 04/08/2022)  Current Outpatient Medications (Respiratory):    albuterol (VENTOLIN HFA) 108 (90 Base) MCG/ACT inhaler, INHALE 2 PUFFS INTO THE LUNGS EVERY 4 HOURS AS NEEDED FOR WHEEZING OR SHORTNESS OF BREATH   fluticasone (FLONASE) 50 MCG/ACT nasal spray, Place 2 sprays into both nostrils daily.   fluticasone (FLOVENT HFA) 44 MCG/ACT inhaler, Inhale 2 puffs into the lungs in the morning and at bedtime.  Current Outpatient Medications (Analgesics):    ibuprofen (ADVIL) 600 MG tablet, Take 1 tablet (600 mg total) by mouth every 6 (six) hours as needed.   Current Outpatient Medications (Other):    hydrocortisone (ANUSOL-HC) 2.5 % rectal cream, Place 1 Application rectally 2 (two) times daily.   hydrocortisone (ANUSOL-HC) 25 MG suppository, Place 1 suppository (25 mg total) rectally 2 (two) times daily.   Multiple Vitamins-Minerals (MULTIVITAMIN WITH MINERALS) tablet, Take 1 tablet by mouth daily.   traZODone (DESYREL) 50 MG tablet, Take 50 mg by mouth at bedtime.   hydrOXYzine (ATARAX/VISTARIL) 25 MG tablet, Take 0.5-1 tablets (12.5-25 mg total) by mouth at bedtime as needed for itching. (Patient not taking: Reported on 04/08/2022)   ondansetron (ZOFRAN-ODT) 4 MG disintegrating tablet, Take 1 tablet (4 mg total) by mouth every 8 (eight) hours as needed for nausea or vomiting. (Patient not taking: Reported on 04/08/2022)  Medical History:  Past Medical History:  Diagnosis Date   Allergy    Anemia    Anxiety    Asthma    Carpal tunnel syndrome    Carpal tunnel syndrome    Depression    HTN (hypertension)    Obesity    Allergies:  Allergies  Allergen Reactions   Penicillins     Hives Has patient had a PCN reaction causing immediate rash, facial/tongue/throat  swelling, SOB or lightheadedness with hypotension: yes Has patient had a PCN reaction causing severe rash involving mucus membranes or skin necrosis: yes Has patient had a PCN reaction that required hospitalization: no Has patient had a PCN reaction occurring within the last 10 years: yes If all of the above answers are "NO", then may proceed with Cephalosporin use.   Rosemary Oil      Surgical History:  He  has a past surgical history that includes Lumbar disc surgery. Family History:  His family history includes Arthritis in his maternal grandmother; Drug abuse in his mother; Heart disease in his maternal grandfather; Heart failure in his mother; Hypertension in his mother. Social History:   reports that he quit smoking about 9 years ago. His smoking use included cigarettes. He has never used smokeless tobacco. He reports current alcohol use. He reports that he does not use drugs.  REVIEW OF SYSTEMS  : All other systems reviewed and negative except where noted in the History of Present Illness.  PHYSICAL EXAM: BP 106/70 (BP Location: Left Arm, Patient Position: Sitting, Cuff Size: Large)   Pulse 76   Ht 5' 8.5" (1.74 m) Comment: height measured without shoes  Wt (!) 303 lb 4 oz (137.6 kg)   BMI 45.44 kg/m  General:   Pleasant, well developed male in no acute distress Head:   Normocephalic and atraumatic. Eyes:  sclerae anicteric,conjunctive pink  Heart:   regular rate and rhythm Pulm:  Clear anteriorly; no wheezing Abdomen:   Soft, Obese AB, Active bowel sounds. mild tenderness in the RLQ. Without guarding and Without rebound, No organomegaly appreciated. Rectal: posterior fissure noted at top of cleft, some mild external erythema, no clear abscess/induration, increased rectal tone, + internal hemorrhoids, no masses but difficult exam due to extreme tenderness, white pus?/mucus on finger, no stool, , no masses, hemoccult Positive Extremities:  Without edema. Msk: Symmetrical  without gross deformities. Peripheral pulses intact.  Neurologic:  Alert and  oriented x4;  No focal deficits.  Skin:   Dry and intact without significant lesions or rashes. Psychiatric:  Cooperative. Normal mood and affect.    Vladimir Crofts, PA-C 12:11 PM

## 2022-04-08 ENCOUNTER — Ambulatory Visit (INDEPENDENT_AMBULATORY_CARE_PROVIDER_SITE_OTHER): Payer: BC Managed Care – PPO | Admitting: Physician Assistant

## 2022-04-08 ENCOUNTER — Other Ambulatory Visit (INDEPENDENT_AMBULATORY_CARE_PROVIDER_SITE_OTHER): Payer: BC Managed Care – PPO

## 2022-04-08 ENCOUNTER — Encounter: Payer: Self-pay | Admitting: Physician Assistant

## 2022-04-08 VITALS — BP 106/70 | HR 76 | Ht 68.5 in | Wt 303.2 lb

## 2022-04-08 DIAGNOSIS — R195 Other fecal abnormalities: Secondary | ICD-10-CM

## 2022-04-08 DIAGNOSIS — K625 Hemorrhage of anus and rectum: Secondary | ICD-10-CM | POA: Diagnosis not present

## 2022-04-08 DIAGNOSIS — A09 Infectious gastroenteritis and colitis, unspecified: Secondary | ICD-10-CM | POA: Diagnosis not present

## 2022-04-08 LAB — CBC WITH DIFFERENTIAL/PLATELET
Basophils Absolute: 0.1 10*3/uL (ref 0.0–0.1)
Basophils Relative: 0.8 % (ref 0.0–3.0)
Eosinophils Absolute: 0.2 10*3/uL (ref 0.0–0.7)
Eosinophils Relative: 2.3 % (ref 0.0–5.0)
HCT: 41.7 % (ref 39.0–52.0)
Hemoglobin: 14.1 g/dL (ref 13.0–17.0)
Lymphocytes Relative: 26.9 % (ref 12.0–46.0)
Lymphs Abs: 2 10*3/uL (ref 0.7–4.0)
MCHC: 33.9 g/dL (ref 30.0–36.0)
MCV: 84.4 fl (ref 78.0–100.0)
Monocytes Absolute: 0.7 10*3/uL (ref 0.1–1.0)
Monocytes Relative: 9.5 % (ref 3.0–12.0)
Neutro Abs: 4.5 10*3/uL (ref 1.4–7.7)
Neutrophils Relative %: 60.5 % (ref 43.0–77.0)
Platelets: 221 10*3/uL (ref 150.0–400.0)
RBC: 4.94 Mil/uL (ref 4.22–5.81)
RDW: 13 % (ref 11.5–15.5)
WBC: 7.4 10*3/uL (ref 4.0–10.5)

## 2022-04-08 LAB — COMPREHENSIVE METABOLIC PANEL
ALT: 25 U/L (ref 0–53)
AST: 19 U/L (ref 0–37)
Albumin: 4.4 g/dL (ref 3.5–5.2)
Alkaline Phosphatase: 57 U/L (ref 39–117)
BUN: 15 mg/dL (ref 6–23)
CO2: 31 mEq/L (ref 19–32)
Calcium: 9.6 mg/dL (ref 8.4–10.5)
Chloride: 101 mEq/L (ref 96–112)
Creatinine, Ser: 1.03 mg/dL (ref 0.40–1.50)
GFR: 92.57 mL/min (ref >60.00)
Glucose, Bld: 90 mg/dL (ref 70–99)
Potassium: 4.1 mEq/L (ref 3.5–5.1)
Sodium: 139 mEq/L (ref 135–145)
Total Bilirubin: 0.4 mg/dL (ref 0.2–1.2)
Total Protein: 7.2 g/dL (ref 6.0–8.3)

## 2022-04-08 LAB — SEDIMENTATION RATE: Sed Rate: 22 mm/hr — ABNORMAL HIGH (ref 0–15)

## 2022-04-08 LAB — HIGH SENSITIVITY CRP: CRP, High Sensitivity: 3.54 mg/L (ref 0.000–5.000)

## 2022-04-08 MED ORDER — HYDROCORTISONE (PERIANAL) 2.5 % EX CREA: 1.0000 | TOPICAL_CREAM | Freq: Two times a day (BID) | CUTANEOUS | 2 refills | Status: DC

## 2022-04-08 MED ORDER — NA SULFATE-K SULFATE-MG SULF 17.5-3.13-1.6 GM/177ML PO SOLN: 1.0000 | Freq: Once | ORAL | 0 refills | Status: AC

## 2022-04-08 MED ORDER — HYDROCORTISONE ACETATE 25 MG RE SUPP: 25.0000 mg | Freq: Two times a day (BID) | RECTAL | 0 refills | Status: DC

## 2022-04-08 NOTE — Patient Instructions (Addendum)
If you are age 38 or younger, your body mass index should be between 19-25. Your Body mass index is 45.44 kg/m. If this is out of the aformentioned range listed, please consider follow up with your Primary Care Provider.  ________________________________________________________  The [facility] providers would like to encourage you to use [facility] to communicate with providers for non-urgent requests or questions.  Due to long hold times on the telephone, sending your provider a message by [facility] may be a faster and more efficient way to get a response.  Please allow 48 business hours for a response.  Please remember that this is for non-urgent requests.  _______________________________________________________  Bonita Quin have been scheduled for a colonoscopy. Please follow written instructions given to you at your visit today.  Please pick up your prep supplies at the pharmacy within the next 1-3 days. If you use inhalers (even only as needed), please bring them with you on the day of your procedure.  Your provider has requested that you go to the basement level for lab work before leaving today. Press "B" on the elevator. The lab is located at the first door on the left as you exit the elevator.  We have sent Anusol-Cream and Anusol suppositories to your pharmacy.  Follow up pending.  Thank you for entrusting me with your care and choosing [facility].  Quentin Mulling, PA-C  You may have POST INFECTIOUS IBS OR IRRITABLE BOWEL After an infection or diverticulitis flare your intestines can spasm or be a little bit more sensitive. Try these things below:  Can do low FODMAP- see below Try trial off milk/lactose products.  Add fiber like benefiber or citracel once a day Can do trial of IBGard for AB pain EVERY DAY- Take 1-2 capsules once a day for maintence or twice a day during a flare if any worsening symptoms like blood in stool, weight loss, please call the office or go to the ER.     FODMAP stands for fermentable oligo-, di-, mono-saccharides and polyols (1). These are the scientific terms used to classify groups of carbs that are notorious for triggering digestive symptoms like bloating, gas and stomach pain.   FODMAPs are found in a wide range of foods in varying amounts. Some foods contain just one type, while others contain several.  The main dietary sources of the four groups of FODMAPs include:  Oligosaccharides: Wheat, rye, legumes and various fruits and vegetables, such as garlic and onions.  Disaccharides: Milk, yogurt and soft cheese. Lactose is the main carb.  Monosaccharides: Various fruit including figs and mangoes, and sweeteners such as honey and agave nectar. Fructose is the main carb.  Polyols: Certain fruits and vegetables including blackberries and lychee, as well as some low-calorie sweeteners like those in sugar-free gum.   Keep a food diary. This will help you identify foods that cause symptoms. Write down: What you eat and when. What symptoms you have. When symptoms occur in relation to your meals. Avoid foods that cause symptoms. Talk with your dietitian about other ways to get the same nutrients that are in these foods. Eat your meals slowly, in a relaxed setting. Aim to eat 5-6 small meals per day. Do not skip meals. Drink enough fluids to keep your urine clear or pale yellow. If dairy products cause your symptoms to flare up, try eating less of them. You might be able to handle yogurt better than other dairy products because it contains bacteria that help with digestion.  Please do sitz baths- these can be found at the pharmacy. It is a Chief Operating Officer that is put in your toliet.  Please increase fiber or add benefiber, increase water and increase acitivity.  Will send in hydrocoritsone suppository, cheapest with GOODRX from [employer], [employer], [employer] or [employer] if your insurance does not pay for it. If the hemorrhoid  suppository sent in is too expensive you can do this over the counter trick.  Apply a pea size amount of over the counter Anusol HC cream to the tip of an over the counter PrepH suppository and insert rectally once every night for at least 7 nights.  If this does not improve there are procedures that can be done.   About Hemorrhoids  Hemorrhoids are swollen veins in the lower rectum and anus.  Also called piles, hemorrhoids are a common problem.  Hemorrhoids may be internal (inside the rectum) or external (around the anus).  Internal Hemorrhoids  Internal hemorrhoids are often painless, but they rarely cause bleeding.  The internal veins may stretch and fall down (prolapse) through the anus to the outside of the body.  The veins may then become irritated and painful.  External Hemorrhoids  External hemorrhoids can be easily seen or felt around the anal opening.  They are under the skin around the anus.  When the swollen veins are scratched or broken by straining, rubbing or wiping they sometimes bleed.  How Hemorrhoids Occur  Veins in the rectum and around the anus tend to swell under pressure.  Hemorrhoids can result from increased pressure in the veins of your anus or rectum.  Some sources of pressure are:  Straining to have a bowel movement because of constipation Waiting too long to have a bowel movement Coughing and sneezing often Sitting for extended periods of time, including on the toilet Diarrhea Obesity Trauma or injury to the anus Some liver diseases Stress Family history of hemorrhoids Pregnancy  Pregnant women should try to avoid becoming constipated, because they are more likely to have hemorrhoids during pregnancy.  In the last trimester of pregnancy, the enlarged uterus may press on blood vessels and causes hemorrhoids.  In addition, the strain of childbirth sometimes causes hemorrhoids after the birth.  Symptoms of Hemorrhoids  Some symptoms of hemorrhoids  include: Swelling and/or a tender lump around the anus Itching, mild burning and bleeding around the anus Painful bowel movements with or without constipation Bright red blood covering the stool, on toilet paper or in the toilet bowel.   Symptoms usually go away within a few days.  Always talk to your doctor about any bleeding to make sure it is not from some other causes.  Diagnosing and Treating Hemorrhoids  Diagnosis is made by an examination by your healthcare provider.  Special test can be performed by your doctor.    Most cases of hemorrhoids can be treated with: High-fiber diet: Eat more high-fiber foods, which help prevent constipation.  Ask for more detailed fiber information on types and sources of fiber from your healthcare provider. Fluids: Drink plenty of water.  This helps soften bowel movements so they are easier to pass. Sitz baths and cold packs: Sitting in lukewarm water two or three times a day for 15 minutes cleases the anal area and may relieve discomfort.  If the water is too hot, swelling around the anus will get worse.  Placing a cloth-covered ice pack on the anus for ten minutes four times a day can also help reduce selling.  Gently pushing a prolapsed hemorrhoid back inside after the bath or ice pack can be helpful. Medications: For mild discomfort, your healthcare provider may suggest over-the-counter pain medication or prescribe a cream or ointment for topical use.  The cream may contain witch hazel, zinc oxide or petroleum jelly.  Medicated suppositories are also a treatment option.  Always consult your doctor before applying medications or creams. Procedures and surgeries: There are also a number of procedures and surgeries to shrink or remove hemorrhoids in more serious cases.  Talk to your physician about these options.  You can often prevent hemorrhoids or keep them from becoming worse by maintaining a healthy lifestyle.  Eat a fiber-rich diet of fruits, vegetables  and whole grains.  Also, drink plenty of water and exercise regularly.   2007, [employer] Doc.30

## 2022-04-12 ENCOUNTER — Encounter: Payer: Self-pay | Admitting: Gastroenterology

## 2022-05-06 ENCOUNTER — Encounter: Payer: Self-pay | Admitting: Gastroenterology

## 2022-05-09 ENCOUNTER — Encounter: Payer: Self-pay | Admitting: Certified Registered Nurse Anesthetist

## 2022-05-13 ENCOUNTER — Ambulatory Visit (AMBULATORY_SURGERY_CENTER): Payer: BC Managed Care – PPO | Admitting: Gastroenterology

## 2022-05-13 ENCOUNTER — Encounter: Payer: Self-pay | Admitting: Gastroenterology

## 2022-05-13 VITALS — BP 120/82 | HR 85 | Temp 98.2°F | Resp 17 | Ht 68.0 in | Wt 303.0 lb

## 2022-05-13 DIAGNOSIS — K5730 Diverticulosis of large intestine without perforation or abscess without bleeding: Secondary | ICD-10-CM | POA: Diagnosis not present

## 2022-05-13 DIAGNOSIS — K640 First degree hemorrhoids: Secondary | ICD-10-CM

## 2022-05-13 DIAGNOSIS — R933 Abnormal findings on diagnostic imaging of other parts of digestive tract: Secondary | ICD-10-CM | POA: Diagnosis not present

## 2022-05-13 DIAGNOSIS — K625 Hemorrhage of anus and rectum: Secondary | ICD-10-CM | POA: Diagnosis not present

## 2022-05-13 MED ORDER — SODIUM CHLORIDE 0.9 % IV SOLN: 500.0000 mL | Freq: Once | INTRAVENOUS | Status: DC

## 2022-05-13 NOTE — Progress Notes (Signed)
04/08/2022 Brian Alvarez [id number] April 11, 1986   Referring provider: No ref. provider found Primary GI doctor: Dr. Lyndel Safe   ASSESSMENT : 1. Rectal bleeding   2. Diarrhea of infectious origin - Resolved  3. Positive fecal occult blood test     Patient with infectious diarrhea diagnosed a month ago, continuing diarrhea with some rectal bleeding and rectal pain. CT showed likely infectious colitis from sigmoid to rectum Slightly abnormal exam today with very tender rectal exam, FOBT positive, possible mucus versus what appeared to be more like pus. 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   PLAN: Will schedule colonoscopy We have discussed the risks of bleeding, infection, perforation, medication reactions, and remote risk of death associated with colonoscopy. All questions were answered and the patient acknowledges these risk and wishes to proceed. -Sitz baths, increase fiber, increase water, need to fix toilet habits.  -Hydrocortisone supp give and external cream sent in.  -We discussed hemorrhoid banding here in the office or surgical treatment if hemorrhoids do not improve with conservative treatment. - follow up for evaluation      Orders Placed This Encounter  Procedures   CBC with Differential/Platelet   Comprehensive metabolic panel   High sensitivity CRP   Sedimentation rate   Ambulatory referral to Gastroenterology        Meds ordered this encounter  Medications   hydrocortisone (ANUSOL-HC) 25 MG suppository      Sig: Place 1 suppository (25 mg total) rectally 2 (two) times daily.      Dispense:  12 suppository      Refill:  0   hydrocortisone (ANUSOL-HC) 2.5 % rectal cream      Sig: Place 1 Application rectally 2 (two) times daily.      Dispense:  30 g      Refill:  2   Na Sulfate-K Sulfate-Mg Sulf 17.5-3.13-1.6  GM/177ML SOLN      Sig: Take 1 kit by mouth once for 1 dose.      Dispense:  324 mL      Refill:  0      Patient Care Team: Pcp, No as PCP - General   HISTORY OF PRESENT ILLNESS: 36 y.o. male with a past medical history of hypertension, asthma, allergies and others listed below presents for evaluation of persistent diarrhea.    Patient's been having diarrhea with nausea vomiting and fever since June 18.  Went to urgent care diagnosed with gastroenteritis given Zofran without any help.   Patient having brown stools with some bright red blood 03/11/2022 ER visit Hemoccult negative, normal creatinine, normal electrolytes, normal lipase and hepatic function. CT scan showed thickening of sigmoid and rectum with some stranding consistent with inflammatory or infectious colitis.   Given Cipro and Flagyl. Positive enteroaggregative E coli and campylobacter, negative Cdiff Wife with UC, has some concern for this.    He states prior to this he would have 4 BM's a day, formed.  No loose stools or BRB in the past.  Was having BM every 20-30 mins, straight water.  Had 1-2 episodes BRB with rectal pain with burning, felt like he was "cut". This has improved.  No AB pain. No nausea or vomiting.  Since this he has had about 10-13 lbs weight loss in a month.  GM with thyroid, but no family history of GI malignancy or autoimmune.  States  he is overall better, appetite has returned.  Now having well formed, small BM's. Having 6 times a day.  No AB pain, some rectal pain still but improved.     03/11/2022 CT AB and pelvis with contrast for diarrhea    Has 65 year old daughter, wife stays at home with her for now.     Wt Readings from Last 3 Encounters:  04/08/22 (!) 303 lb 4 oz (137.6 kg)  03/11/22 (!) 319 lb (144.7 kg)  07/11/20 (!) 319 lb (144.7 kg)      Current Medications:      Current Outpatient Medications (Cardiovascular):    amLODipine (NORVASC) 5 MG tablet, Take 1 tablet (5 mg  total) by mouth daily.   olmesartan-hydrochlorothiazide (BENICAR HCT) 20-12.5 MG tablet, Take 1 tablet by mouth daily.   EPINEPHrine (EPIPEN 2-PAK) 0.3 mg/0.3 mL IJ SOAJ injection, Inject 0.3 mLs (0.3 mg total) into the muscle as needed for anaphylaxis. (Patient not taking: Reported on 04/08/2022)   Current Outpatient Medications (Respiratory):    albuterol (VENTOLIN HFA) 108 (90 Base) MCG/ACT inhaler, INHALE 2 PUFFS INTO THE LUNGS EVERY 4 HOURS AS NEEDED FOR WHEEZING OR SHORTNESS OF BREATH   fluticasone (FLONASE) 50 MCG/ACT nasal spray, Place 2 sprays into both nostrils daily.   fluticasone (FLOVENT HFA) 44 MCG/ACT inhaler, Inhale 2 puffs into the lungs in the morning and at bedtime.   Current Outpatient Medications (Analgesics):    ibuprofen (ADVIL) 600 MG tablet, Take 1 tablet (600 mg total) by mouth every 6 (six) hours as needed.     Current Outpatient Medications (Other):    hydrocortisone (ANUSOL-HC) 2.5 % rectal cream, Place 1 Application rectally 2 (two) times daily.   hydrocortisone (ANUSOL-HC) 25 MG suppository, Place 1 suppository (25 mg total) rectally 2 (two) times daily.   Multiple Vitamins-Minerals (MULTIVITAMIN WITH MINERALS) tablet, Take 1 tablet by mouth daily.   traZODone (DESYREL) 50 MG tablet, Take 50 mg by mouth at bedtime.   hydrOXYzine (ATARAX/VISTARIL) 25 MG tablet, Take 0.5-1 tablets (12.5-25 mg total) by mouth at bedtime as needed for itching. (Patient not taking: Reported on 04/08/2022)   ondansetron (ZOFRAN-ODT) 4 MG disintegrating tablet, Take 1 tablet (4 mg total) by mouth every 8 (eight) hours as needed for nausea or vomiting. (Patient not taking: Reported on 04/08/2022)   Medical History:      Past Medical History:  Diagnosis Date   Allergy     Anemia     Anxiety     Asthma     Carpal tunnel syndrome     Carpal tunnel syndrome     Depression     HTN (hypertension)     Obesity      Allergies:       Allergies  Allergen Reactions   Penicillins         Hives Has patient had a PCN reaction causing immediate rash, facial/tongue/throat swelling, SOB or lightheadedness with hypotension: yes Has patient had a PCN reaction causing severe rash involving mucus membranes or skin necrosis: yes Has patient had a PCN reaction that required hospitalization: no Has patient had a PCN reaction occurring within the last 10 years: yes If all of the above answers are "NO", then may proceed with Cephalosporin use.   Rosemary Oil        Surgical History:  He  has a past surgical history that includes Lumbar disc surgery. Family History:  His family history includes Arthritis in his maternal grandmother; Drug abuse in his mother;  Heart disease in his maternal grandfather; Heart failure in his mother; Hypertension in his mother. Social History:   reports that he quit smoking about 9 years ago. His smoking use included cigarettes. He has never used smokeless tobacco. He reports current alcohol use. He reports that he does not use drugs.   REVIEW OF SYSTEMS  : All other systems reviewed and negative except where noted in the History of Present Illness.     PHYSICAL EXAM: BP 106/70 (BP Location: Left Arm, Patient Position: Sitting, Cuff Size: Large)   Pulse 76   Ht 5' 8.5" (1.74 m) Comment: height measured without shoes  Wt (!) 303 lb 4 oz (137.6 kg)   BMI 45.44 kg/m  General:   Pleasant, well developed male in no acute distress Head:   Normocephalic and atraumatic. Eyes:  sclerae anicteric,conjunctive pink  Heart:   regular rate and rhythm Pulm:  Clear anteriorly; no wheezing Abdomen:   Soft, Obese AB, Active bowel sounds. mild tenderness in the RLQ. Without guarding and Without rebound, No organomegaly appreciated. Rectal: posterior fissure noted at top of cleft, some mild external erythema, no clear abscess/induration, increased rectal tone, + internal hemorrhoids, no masses but difficult exam due to extreme tenderness, white pus?/mucus on finger, no  stool, , no masses, hemoccult Positive Extremities:  Without edema. Msk: Symmetrical without gross deformities. Peripheral pulses intact.  Neurologic:  Alert and  oriented x4;  No focal deficits.  Skin:   Dry and intact without significant lesions or rashes. Psychiatric:  Cooperative. Normal mood and affect.       Vladimir Crofts, PA-C     Attending physician's note   I have taken history, reviewed the chart and examined the patient. I performed a substantive portion of this encounter, including complete performance of at least one of the key components, in conjunction with the APP. I agree with the Advanced Practitioner's note, impression and recommendations.   Colon today.   Carmell Austria, MD [facility] [phone]

## 2022-05-13 NOTE — Op Note (Signed)
[facility] Patient Name: Brian Alvarez Procedure Date: 05/13/2022 2:02 PM MRN: [id number] Endoscopist: Lynann Bologna , MD Age: 36 Referring MD:  Date of Birth: 09-14-1986 Gender: Male Account #: [id number] Procedure:                Colonoscopy Indications:              Rectal bleeding with abn CT AP showing sigmoid                            thickening (? Infectious colitis, r/o IBD) Medicines:                Monitored Anesthesia Care Procedure:                Pre-Anesthesia Assessment:                           - Prior to the procedure, a History and Physical                            was performed, and patient medications and                            allergies were reviewed. The patient's tolerance of                            previous anesthesia was also reviewed. The risks                            and benefits of the procedure and the sedation                            options and risks were discussed with the patient.                            All questions were answered, and informed consent                            was obtained. Prior Anticoagulants: The patient has                            taken no previous anticoagulant or antiplatelet                            agents. ASA Grade Assessment: II - A patient with                            mild systemic disease. After reviewing the risks                            and benefits, the patient was deemed in                            satisfactory condition to undergo the procedure.  After obtaining informed consent, the colonoscope                            was passed under direct vision. Throughout the                            procedure, the patient's blood pressure, pulse, and                            oxygen saturations were monitored continuously. The                            CF [id number] #[id number] was introduced through the anus                            and advanced to the 2 cm  into the ileum. The                            colonoscopy was performed without difficulty. The                            patient tolerated the procedure well. The quality                            of the bowel preparation was good. The terminal                            ileum, ileocecal valve, appendiceal orifice, and                            rectum were photographed. Scope In: 2:18:33 PM Scope Out: 2:29:12 PM Scope Withdrawal Time: 0 hours 6 minutes 34 seconds  Total Procedure Duration: 0 hours 10 minutes 39 seconds  Findings:                 A few small-mouthed diverticula were found in the                            sigmoid colon.                           Non-bleeding internal hemorrhoids were found during                            retroflexion. The hemorrhoids were small and Grade                            I (internal hemorrhoids that do not prolapse).                           The terminal ileum appeared normal.                           The exam was otherwise without abnormality on  direct and retroflexion views. Complications:            No immediate complications. Estimated Blood Loss:     Estimated blood loss: none. Impression:               - Mild sigmoid diverticulosis.                           - Non-bleeding internal hemorrhoids.                           - The examined portion of the ileum was normal.                           - The examination was otherwise normal on direct                            and retroflexion views.                           - No specimens collected. Recommendation:           - Patient has a contact number available for                            emergencies. The signs and symptoms of potential                            delayed complications were discussed with the                            patient. Return to normal activities tomorrow.                            Written discharge instructions were provided to  the                            patient.                           - Resume previous diet.                           - Continue present medications.                           - Repeat colonoscopy in 10 years for screening                            purposes. Earlier, if with any new problems or                            change in family history.                           - If any further rectal bleeding, use Preparation H  1 twice daily after the bowel movement x 10 days.                            If still with problems, follow-up with GI.                           - The findings and recommendations were discussed                            with the patient's family. Lynann Bologna, MD 05/13/2022 2:38:20 PM This report has been signed electronically.

## 2022-05-13 NOTE — Progress Notes (Signed)
Report given to PACU, vss 

## 2022-05-13 NOTE — Patient Instructions (Signed)
Handouts on hemorrhoids and diverticulosis given to patient. Await pathology results. Repeat colonoscopy in 10 years for surveillance. Earlier, if any new symptoms or change in family history. If any further rectal bleeding, use Preparation H1 twice daily after the bowel movement X 10 days. If still with problems, follow-up with GI.  Resume previous diet and continue present medications.  YOU HAD AN ENDOSCOPIC PROCEDURE TODAY AT THE [facility]:   Refer to the procedure report that was given to you for any specific questions about what was found during the examination.  If the procedure report does not answer your questions, please call your gastroenterologist to clarify.  If you requested that your care partner not be given the details of your procedure findings, then the procedure report has been included in a sealed envelope for you to review at your convenience later.  YOU SHOULD EXPECT: Some feelings of bloating in the abdomen. Passage of more gas than usual.  Walking can help get rid of the air that was put into your GI tract during the procedure and reduce the bloating. If you had a lower endoscopy (such as a colonoscopy or flexible sigmoidoscopy) you may notice spotting of blood in your stool or on the toilet paper. If you underwent a bowel prep for your procedure, you may not have a normal bowel movement for a few days.  Please Note:  You might notice some irritation and congestion in your nose or some drainage.  This is from the oxygen used during your procedure.  There is no need for concern and it should clear up in a day or so.  SYMPTOMS TO REPORT IMMEDIATELY:  Following lower endoscopy (colonoscopy or flexible sigmoidoscopy):  Excessive amounts of blood in the stool  Significant tenderness or worsening of abdominal pains  Swelling of the abdomen that is new, acute  Fever of 100F or higher  For urgent or emergent issues, a gastroenterologist can be reached at any hour  by calling [phone]. Do not use MyChart messaging for urgent concerns.    DIET:  We do recommend a small meal at first, but then you may proceed to your regular diet.  Drink plenty of fluids but you should avoid alcoholic beverages for 24 hours.  ACTIVITY:  You should plan to take it easy for the rest of today and you should NOT DRIVE or use heavy machinery until tomorrow (because of the sedation medicines used during the test).    FOLLOW UP: Our staff will call the number listed on your records the next business day following your procedure.  We will call around 7:15- 8:00 am to check on you and address any questions or concerns that you may have regarding the information given to you following your procedure. If we do not reach you, we will leave a message.  If you develop any symptoms (ie: fever, flu-like symptoms, shortness of breath, cough etc.) before then, please call [phone].  If you test positive for Covid 19 in the 2 weeks post procedure, please call and report this information to [place].    If any biopsies were taken you will be contacted by phone or by letter within the next 1-3 weeks.  Please call us at [phone] if you have not heard about the biopsies in 3 weeks.    SIGNATURES/CONFIDENTIALITY: You and/or your care partner have signed paperwork which will be entered into your electronic medical record.  These signatures attest to the fact that that the information above  on your After Visit Summary has been reviewed and is understood.  Full responsibility of the confidentiality of this discharge information lies with you and/or your care-partner.

## 2022-05-14 ENCOUNTER — Telehealth: Payer: Self-pay

## 2022-05-14 NOTE — Telephone Encounter (Signed)
Attempted to reach pt. With follow-up call following endoscopic procedure 05/13/2022.  LM on pt. Voice mail to call if he has any questions or concerns.

## 2022-06-04 ENCOUNTER — Ambulatory Visit
Admission: RE | Admit: 2022-06-04 | Discharge: 2022-06-04 | Discharge status: Against Medical Advice | Payer: BC Managed Care – PPO | Source: Ambulatory Visit

## 2022-06-07 ENCOUNTER — Ambulatory Visit (INDEPENDENT_AMBULATORY_CARE_PROVIDER_SITE_OTHER): Payer: BC Managed Care – PPO | Admitting: Nurse Practitioner

## 2022-06-07 ENCOUNTER — Encounter: Payer: Self-pay | Admitting: Nurse Practitioner

## 2022-06-07 VITALS — BP 118/66 | Temp 97.7°F | Ht 68.75 in | Wt 303.0 lb

## 2022-06-07 DIAGNOSIS — Z79899 Other long term (current) drug therapy: Secondary | ICD-10-CM | POA: Insufficient documentation

## 2022-06-07 DIAGNOSIS — G4750 Parasomnia, unspecified: Secondary | ICD-10-CM

## 2022-06-07 DIAGNOSIS — J4520 Mild intermittent asthma, uncomplicated: Secondary | ICD-10-CM

## 2022-06-07 DIAGNOSIS — F909 Attention-deficit hyperactivity disorder, unspecified type: Secondary | ICD-10-CM | POA: Diagnosis not present

## 2022-06-07 DIAGNOSIS — Z23 Encounter for immunization: Secondary | ICD-10-CM

## 2022-06-07 DIAGNOSIS — Z6841 Body Mass Index (BMI) 40.0 and over, adult: Secondary | ICD-10-CM

## 2022-06-07 DIAGNOSIS — M5441 Lumbago with sciatica, right side: Secondary | ICD-10-CM | POA: Diagnosis not present

## 2022-06-07 DIAGNOSIS — G8929 Other chronic pain: Secondary | ICD-10-CM

## 2022-06-07 MED ORDER — ALBUTEROL SULFATE HFA 108 (90 BASE) MCG/ACT IN AERS: INHALATION_SPRAY | RESPIRATORY_TRACT | 1 refills | Status: DC

## 2022-06-07 MED ORDER — PREDNISONE 10 MG PO TABS: ORAL_TABLET | ORAL | 0 refills | Status: DC

## 2022-06-07 MED ORDER — AMPHETAMINE-DEXTROAMPHETAMINE 10 MG PO TABS: 10.0000 mg | ORAL_TABLET | Freq: Two times a day (BID) | ORAL | 0 refills | Status: DC

## 2022-06-07 NOTE — Progress Notes (Signed)
New Patient Visit  BP 118/66 (BP Location: Left Arm, Patient Position: Sitting, Cuff Size: Large)   Temp 97.7 F (36.5 C) (Temporal)   Ht 5' 8.75" (1.746 m)   Wt (!) 303 lb (137.4 kg)   BMI 45.07 kg/m    Subjective:    Patient ID: Brian Alvarez, male    DOB: Apr 22, 1986, 36 y.o.   MRN: [id number]  CC: Chief Complaint  Patient presents with   Establish Care    Np. Est care. Pt c/o back pain possibly due to recent strenuous activity for several days. Pt has a hx of back pain/injury     HPI: Brian Alvarez is a 36 y.o. male presents for new patient visit to establish care.  Introduced to Designer, [employer]ellery role and practice setting.  All questions answered.  Discussed provider/patient relationship and expectations.  He was spinning his daughter at the park, leaned to the side, and after a few pulls, felt like something hit his back. This happened 8 days ago. He has been having right lower back pain that radiates down his right leg. He does have an injury to his back in the past, resulting in a disc herniation. He had surgery as well. Moving makes his pain worse. Ice/heat helps for the moment, then the pain comes back. He has tried massage, ice, heat, stretching, ibuprofen, and tylenol.   He has a history of hypertension and is taking olmesartan-hctz 20-12.5mg  daily. He checks his blood pressure at home and it is good. When his stress levels are down, he will not take the medication. He denies chest pain and shortness of breath. He has rare headaches.   He has a history of insomnia. He states that he sleeps well if he is not at home or in a hotel, however at home he has trouble falling asleep. He had a sleep study which was negative. He takes trazodone 50mg  daily which helps with his sleep.   He has a history of asthma.  He is taking Flovent 2 puffs twice a day.  He is also using albuterol as needed.  He states that his symptoms are overall well controlled.  His allergies will sometimes make  his asthma worse.  He states that some days he does not use his albuterol inhaler at all and sometimes he will needed a few times.  He has a history of ADHD diagnosed by psychiatry.  He was taking Adderall 10 mg twice a day as needed.  He states that he last had this filled in November 2022.  He is interested in restarting this again to help with his symptoms, especially while at work.  He states that it also helps him drink less caffeine when he is taking the Adderall.  He did not have any side effects from the medication.  Depression and Anxiety Screen Done:     06/07/2022    9:35 AM 07/11/2020    4:36 PM 05/07/2020    4:27 PM 04/23/2020    4:15 PM 10/02/2018    2:21 PM  Depression screen PHQ 2/9  Decreased Interest 1 0 0 0 0  Down, Depressed, Hopeless 1 0 0 0 0  PHQ - 2 Score 2 0 0 0 0  Altered sleeping 3      Change in appetite 0      Feeling bad or failure about yourself  1      Trouble concentrating 0      Moving slowly or fidgety/restless 0  Suicidal thoughts 0      PHQ-9 Score 6      Difficult doing work/chores Somewhat difficult          06/07/2022    9:35 AM 08/26/2018   11:45 AM  GAD 7 : Generalized Anxiety Score  Nervous, Anxious, on Edge 2 3  Control/stop worrying 2 3  Worry too much - different things 0 3  Trouble relaxing 1 2  Restless 0 2  Easily annoyed or irritable 1 2  Afraid - awful might happen 2 0  Total GAD 7 Score 8 15  Anxiety Difficulty Somewhat difficult Extremely difficult    Past Medical History:  Diagnosis Date   ADHD    Allergy    Anemia    Anxiety    Asthma    Carpal tunnel syndrome    Depression    HTN (hypertension)    Insomnia    Lumbar herniated disc    Obesity     Past Surgical History:  Procedure Laterality Date   LUMBAR DISC SURGERY      Family History  Problem Relation Age of Onset   Drug abuse Mother    Heart failure Mother    Hypertension Mother    Arthritis Maternal Grandmother    Heart disease Maternal  Grandfather    Colon cancer Neg Hx    Stomach cancer Neg Hx    Esophageal cancer Neg Hx      Social History   Tobacco Use   Smoking status: Former    Types: Cigarettes    Quit date: 2014    Years since quitting: 9.7   Smokeless tobacco: Never   Tobacco comments:    has switched to electronic cigarette  Vaping Use   Vaping Use: Some days  Substance Use Topics   Alcohol use: Yes    Comment: occassional   Drug use: No    Current Outpatient Medications on File Prior to Visit  Medication Sig Dispense Refill   EPINEPHrine (EPIPEN 2-PAK) 0.3 mg/0.3 mL IJ SOAJ injection Inject 0.3 mLs (0.3 mg total) into the muscle as needed for anaphylaxis. (Patient not taking: Reported on 04/08/2022) 2 each 0   fluticasone (FLONASE) 50 MCG/ACT nasal spray Place 2 sprays into both nostrils daily. 16 g 0   fluticasone (FLOVENT HFA) 44 MCG/ACT inhaler Inhale 2 puffs into the lungs in the morning and at bedtime. 1 each 5   ibuprofen (ADVIL) 600 MG tablet Take 1 tablet (600 mg total) by mouth every 6 (six) hours as needed. 30 tablet 0   Multiple Vitamins-Minerals (MULTIVITAMIN WITH MINERALS) tablet Take 1 tablet by mouth daily.     olmesartan-hydrochlorothiazide (BENICAR HCT) 20-12.5 MG tablet Take 1 tablet by mouth daily.     traZODone (DESYREL) 50 MG tablet Take 50 mg by mouth at bedtime.     [DISCONTINUED] levocetirizine (XYZAL) 5 MG tablet Take 5 mg by mouth every evening.     [DISCONTINUED] montelukast (SINGULAIR) 10 MG tablet Take one daily at bedtime (Patient taking differently: Take 10 mg by mouth at bedtime. ) 30 tablet 11   No current facility-administered medications on file prior to visit.     Review of Systems  Constitutional:  Positive for fatigue. Negative for fever.  HENT: Negative.    Eyes: Negative.   Respiratory: Negative.    Cardiovascular: Negative.   Gastrointestinal: Negative.   Genitourinary: Negative.   Musculoskeletal:  Positive for back pain.  Skin: Negative.    Neurological: Negative.   Psychiatric/Behavioral:  Negative for dysphoric mood. The patient is nervous/anxious.       Objective:    BP 118/66 (BP Location: Left Arm, Patient Position: Sitting, Cuff Size: Large)   Temp 97.7 F (36.5 C) (Temporal)   Ht 5' 8.75" (1.746 m)   Wt (!) 303 lb (137.4 kg)   BMI 45.07 kg/m   Wt Readings from Last 3 Encounters:  06/07/22 (!) 303 lb (137.4 kg)  05/13/22 (!) 303 lb (137.4 kg)  04/08/22 (!) 303 lb 4 oz (137.6 kg)    BP Readings from Last 3 Encounters:  06/07/22 118/66  05/13/22 120/82  04/08/22 106/70    Physical Exam Vitals and nursing note reviewed.  Constitutional:      Appearance: Normal appearance.  HENT:     Head: Normocephalic.  Eyes:     Conjunctiva/sclera: Conjunctivae normal.  Cardiovascular:     Rate and Rhythm: Normal rate and regular rhythm.     Pulses: Normal pulses.     Heart sounds: Normal heart sounds.  Pulmonary:     Effort: Pulmonary effort is normal.     Breath sounds: Normal breath sounds.  Musculoskeletal:        General: No swelling or tenderness.     Cervical back: Normal range of motion.     Comments: Lumbar ROM limited due to pain.  Negative straight leg raise bilaterally  Skin:    General: Skin is warm.  Neurological:     General: No focal deficit present.     Mental Status: He is alert and oriented to person, place, and time.  Psychiatric:        Mood and Affect: Mood normal.        Behavior: Behavior normal.        Thought Content: Thought content normal.        Judgment: Judgment normal.        Assessment & Plan:   Problem List Items Addressed This Visit       Respiratory   Asthma    Chronic, stable.  Continue Flovent 44 mcg inhaler 2 puffs twice a day along with albuterol inhaler as needed.  Symptoms are well controlled right now.  Continue Flonase daily.  Prevnar 20 given today.  Follow-up if symptoms worsen or with any concerns.      Relevant Medications   predniSONE (DELTASONE)  10 MG tablet   albuterol (VENTOLIN HFA) 108 (90 Base) MCG/ACT inhaler     Nervous and Auditory   Chronic right-sided low back pain with right-sided sciatica - Primary    He has a history of low back pain in 2020 from a L4-L5 disc herniation and required surgery.  He states that he was at the park the other day and reinjured his back.  He states that the symptoms are similar to when he had this originally happened.  He is experiencing right lower back pain that radiates down his leg.  He has been taking ibuprofen and Tylenol along with using heat and ice.  The pain has improved some, however has not gone away completely.  We will have him start a prednisone taper, start low back exercises daily.  He is requesting an MRI to be completed, agree with this and order placed.  Follow-up in 4 to 6 weeks.      Relevant Medications   predniSONE (DELTASONE) 10 MG tablet   amphetamine-dextroamphetamine (ADDERALL) 10 MG tablet   Other Relevant Orders   MR Lumbar Spine Wo Contrast  Other   Attention deficit hyperactivity disorder (ADHD)    Chronic, ongoing.  He was taking Adderall 10 mg twice a day as needed for ADHD symptoms.  He would like to restart this today.  He states that his symptoms were well controlled and he did not have any side effects.  PDMP reviewed.  Controlled substance agreement signed.  Adderall 10 mg twice daily sent to the pharmacy.  Follow-up in 3 months.      Morbid obesity with body mass index of 45.0-49.9 in adult (HCC)    BMI 45.  He states that his prior provider was going to start him on Wegovy, however with the drug shortage she was unable to find this.  Encouraged him to focus on nutrition and exercise.  When [facility] becomes available again, we will start this.      Relevant Medications   amphetamine-dextroamphetamine (ADDERALL) 10 MG tablet   Parasomnia, unspecified    Chronic, stable.  He states that he had a sleep study done which did not show anything abnormal.  He is  taking trazodone 3 mg at bedtime to help with insomnia.  We will continue this regimen.      Controlled substance agreement signed    Controlled substance agreement signed for Adderall for ADHD.      Other Visit Diagnoses     Need for influenza vaccination       Flu vaccine given today   Relevant Orders   Flu Vaccine QUAD 6+ mos PF IM (Fluarix Quad PF) (Completed)   Need for pneumococcal 20-valent conjugate vaccination       20 given today with history of asthma   Relevant Orders   Pneumococcal conjugate vaccine 20-valent (Prevnar 20) (Completed)        Follow up plan: Return in about 4 weeks (around 07/05/2022) for 4-6 weeks back pain.

## 2022-06-07 NOTE — Assessment & Plan Note (Signed)
Chronic, stable.  Continue Flovent 44 mcg inhaler 2 puffs twice a day along with albuterol inhaler as needed.  Symptoms are well controlled right now.  Continue Flonase daily.  Prevnar 20 given today.  Follow-up if symptoms worsen or with any concerns.

## 2022-06-07 NOTE — Assessment & Plan Note (Signed)
He has a history of low back pain in 2020 from a L4-L5 disc herniation and required surgery.  He states that he was at the park the other day and reinjured his back.  He states that the symptoms are similar to when he had this originally happened.  He is experiencing right lower back pain that radiates down his leg.  He has been taking ibuprofen and Tylenol along with using heat and ice.  The pain has improved some, however has not gone away completely.  We will have him start a prednisone taper, start low back exercises daily.  He is requesting an MRI to be completed, agree with this and order placed.  Follow-up in 4 to 6 weeks.

## 2022-06-07 NOTE — Assessment & Plan Note (Signed)
Controlled substance agreement signed for Adderall for ADHD.

## 2022-06-07 NOTE — Assessment & Plan Note (Signed)
Chronic, stable.  He states that he had a sleep study done which did not show anything abnormal.  He is taking trazodone 3 mg at bedtime to help with insomnia.  We will continue this regimen.

## 2022-06-07 NOTE — Assessment & Plan Note (Signed)
BMI 45.  He states that his prior provider was going to start him on Wegovy, however with the drug shortage she was unable to find this.  Encouraged him to focus on nutrition and exercise.  When [facility] becomes available again, we will start this.

## 2022-06-07 NOTE — Patient Instructions (Signed)
It was great to see you!  I have ordered an MRI of your back. [facility] will call to schedule.   Start prednisone taper 6 tablets today, 5 tomorrow, 4 the next day, keep decreasing by 1 every day until gone. Take this in the morning with food.   I have attached stretches to do daily.   Let's follow-up in 4-6 weeks, sooner if you have concerns.  If a referral was placed today, you will be contacted for an appointment. Please note that routine referrals can sometimes take up to 3-4 weeks to process. Please call our office if you haven't heard anything after this time frame.  Take care,  Vance Peper, NP

## 2022-06-07 NOTE — Assessment & Plan Note (Signed)
Chronic, ongoing.  He was taking Adderall 10 mg twice a day as needed for ADHD symptoms.  He would like to restart this today.  He states that his symptoms were well controlled and he did not have any side effects.  PDMP reviewed.  Controlled substance agreement signed.  Adderall 10 mg twice daily sent to the pharmacy.  Follow-up in 3 months.

## 2022-06-08 ENCOUNTER — Encounter: Payer: Self-pay | Admitting: Nurse Practitioner

## 2022-06-18 ENCOUNTER — Ambulatory Visit: Payer: BC Managed Care – PPO | Admitting: Nurse Practitioner

## 2022-06-23 ENCOUNTER — Ambulatory Visit
Admission: RE | Admit: 2022-06-23 | Discharge: 2022-06-23 | Disposition: A | Discharge status: Home / Self Care | Payer: BC Managed Care – PPO | Source: Ambulatory Visit | Attending: Nurse Practitioner | Admitting: Nurse Practitioner

## 2022-06-23 DIAGNOSIS — M545 Low back pain, unspecified: Secondary | ICD-10-CM | POA: Diagnosis not present

## 2022-06-23 DIAGNOSIS — G8929 Other chronic pain: Secondary | ICD-10-CM

## 2022-06-24 ENCOUNTER — Inpatient Hospital Stay: Admission: RE | Admit: 2022-06-24 | Payer: BC Managed Care – PPO | Source: Ambulatory Visit

## 2022-06-24 ENCOUNTER — Encounter: Payer: Self-pay | Admitting: Nurse Practitioner

## 2022-06-30 ENCOUNTER — Ambulatory Visit (INDEPENDENT_AMBULATORY_CARE_PROVIDER_SITE_OTHER): Payer: BC Managed Care – PPO | Admitting: Nurse Practitioner

## 2022-06-30 ENCOUNTER — Encounter: Payer: Self-pay | Admitting: Nurse Practitioner

## 2022-06-30 VITALS — BP 117/77 | HR 95 | Temp 97.4°F | Ht 68.0 in | Wt 303.0 lb

## 2022-06-30 DIAGNOSIS — M5441 Lumbago with sciatica, right side: Secondary | ICD-10-CM

## 2022-06-30 DIAGNOSIS — G8929 Other chronic pain: Secondary | ICD-10-CM | POA: Diagnosis not present

## 2022-06-30 MED ORDER — ALBUTEROL SULFATE (2.5 MG/3ML) 0.083% IN NEBU: 2.5000 mg | INHALATION_SOLUTION | Freq: Four times a day (QID) | RESPIRATORY_TRACT | 1 refills | Status: DC | PRN

## 2022-06-30 NOTE — Patient Instructions (Signed)
It was great to see you!  I have placed a referral to Dr. Arnoldo Morale with [facility] and they will call to schedule an appointment. You can keep taking ibuprofen and tylenol as needed for pain.   Let's follow-up in 3 months, sooner if you have concerns.  If a referral was placed today, you will be contacted for an appointment. Please note that routine referrals can sometimes take up to 3-4 weeks to process. Please call our office if you haven't heard anything after this time frame.  Take care,  Vance Peper, NP

## 2022-06-30 NOTE — Progress Notes (Signed)
   Established Patient Office Visit  Subjective   Patient ID: Brian Alvarez, male    DOB: [birth year]  Age: 36 y.o. MRN: [id number]  Chief Complaint  Patient presents with   Follow-up    4 w/k follow up Chronic back pain. No other concerns    HPI  Brian Alvarez is here to follow-up on acute on chronic low back pain. He states the pain has slightly improved, however he is alternating ibuprofen and tylenol every 8 hours. The pain sometimes radiates down his right leg. The pain worsens with prolonged sitting, moving from sitting to standing, and bending forward and reaching for something. He denies fevers, incontinence.     ROS See pertinent positives and negatives per HPI.    Objective:     BP 117/77 (BP Location: Left Arm, Patient Position: Sitting, Cuff Size: Large)   Pulse 95   Temp (!) 97.4 F (36.3 C) (Temporal)   Ht 5\' 8"  (1.727 m)   Wt (!) 303 lb (137.4 kg)   SpO2 97%   BMI 46.07 kg/m    Physical Exam Vitals and nursing note reviewed.  Constitutional:      Appearance: Normal appearance.  HENT:     Head: Normocephalic.  Eyes:     Conjunctiva/sclera: Conjunctivae normal.  Cardiovascular:     Rate and Rhythm: Normal rate and regular rhythm.     Pulses: Normal pulses.     Heart sounds: Normal heart sounds.  Pulmonary:     Effort: Pulmonary effort is normal.     Breath sounds: Normal breath sounds.  Musculoskeletal:     Cervical back: Normal range of motion.  Skin:    General: Skin is warm.  Neurological:     General: No focal deficit present.     Mental Status: He is alert and oriented to person, place, and time.  Psychiatric:        Mood and Affect: Mood normal.        Behavior: Behavior normal.        Thought Content: Thought content normal.        Judgment: Judgment normal.      Assessment & Plan:   Problem List Items Addressed This Visit       Nervous and Auditory   Chronic right-sided low back pain with right-sided sciatica - Primary    Acute on  chronic right low back pain. MRI showed prior L4-L5 surgery with recurrent disc protrusion likely pressing against L5 nerve root. Will have him continue alternating tylenol and ibuprofen. Will place a referral back to [facility] as this was who did his prior surgery.       Relevant Orders   Ambulatory referral to Neurosurgery    Return in about 3 months (around 09/30/2022) for follow-up.    Charyl Dancer, NP

## 2022-06-30 NOTE — Assessment & Plan Note (Addendum)
Acute on chronic right low back pain. MRI showed prior L4-L5 surgery with recurrent disc protrusion likely pressing against L5 nerve root. Will have him continue alternating tylenol and ibuprofen. Will place a referral back to [facility] as this was who did his prior surgery.

## 2022-07-09 ENCOUNTER — Encounter: Payer: Self-pay | Admitting: Family Medicine

## 2022-07-09 ENCOUNTER — Ambulatory Visit (INDEPENDENT_AMBULATORY_CARE_PROVIDER_SITE_OTHER): Payer: BC Managed Care – PPO | Admitting: Family Medicine

## 2022-07-09 VITALS — BP 122/83 | HR 90 | Temp 97.7°F | Ht 68.0 in | Wt 302.0 lb

## 2022-07-09 DIAGNOSIS — J4521 Mild intermittent asthma with (acute) exacerbation: Secondary | ICD-10-CM

## 2022-07-09 DIAGNOSIS — J0100 Acute maxillary sinusitis, unspecified: Secondary | ICD-10-CM

## 2022-07-09 MED ORDER — PREDNISONE 20 MG PO TABS: 20.0000 mg | ORAL_TABLET | Freq: Two times a day (BID) | ORAL | 0 refills | Status: AC

## 2022-07-09 MED ORDER — CLARITHROMYCIN ER 500 MG PO TB24: 1000.0000 mg | ORAL_TABLET | Freq: Every day | ORAL | 0 refills | Status: AC

## 2022-07-09 NOTE — Progress Notes (Unsigned)
   Established Patient Office Visit  Subjective   Patient ID: Hawthorne Day, male    DOB: 02-12-1986  Age: 36 y.o. MRN: [id number]  Chief Complaint  Patient presents with   Sinus Problem    Pt c/o sinus headaches drainage coughing x 2 1/2 weeks     HPI  {History (Optional):23778}  ROS    Objective:     BP 122/83 (BP Location: Right Arm, Patient Position: Sitting, Cuff Size: Large)   Pulse 90   Temp 97.7 F (36.5 C) (Temporal)   Ht 5\' 8"  (1.727 m)   Wt (!) 302 lb (137 kg)   SpO2 97%   BMI 45.92 kg/m  {Vitals History (Optional):23777}  Physical Exam   No results found for any visits on 07/09/22.  {Labs (Optional):23779}  The ASCVD Risk score (Arnett DK, et al., 2019) failed to calculate for the following reasons:   The 2019 ASCVD risk score is only valid for ages 71 to 29    Assessment & Plan:   Problem List Items Addressed This Visit       Respiratory   Reactive airway disease   Relevant Medications   predniSONE (DELTASONE) 20 MG tablet   Acute maxillary sinusitis - Primary   Relevant Medications   predniSONE (DELTASONE) 20 MG tablet   clarithromycin (BIAXIN XL) 500 MG 24 hr tablet    Return Has follow-up scheduled with Lauren in a few weeks. Hold ibuprophen while on prednisone.Libby Maw, MD

## 2022-07-12 ENCOUNTER — Encounter: Payer: Self-pay | Admitting: Family Medicine

## 2022-07-12 ENCOUNTER — Ambulatory Visit: Payer: BC Managed Care – PPO | Admitting: Nurse Practitioner

## 2022-07-30 DIAGNOSIS — M5126 Other intervertebral disc displacement, lumbar region: Secondary | ICD-10-CM | POA: Diagnosis not present

## 2022-07-30 DIAGNOSIS — Z6841 Body Mass Index (BMI) 40.0 and over, adult: Secondary | ICD-10-CM | POA: Diagnosis not present

## 2022-07-30 DIAGNOSIS — M5136 Other intervertebral disc degeneration, lumbar region: Secondary | ICD-10-CM | POA: Diagnosis not present

## 2022-08-16 DIAGNOSIS — M5416 Radiculopathy, lumbar region: Secondary | ICD-10-CM | POA: Diagnosis not present

## 2022-08-27 DIAGNOSIS — M5416 Radiculopathy, lumbar region: Secondary | ICD-10-CM | POA: Diagnosis not present

## 2022-09-03 ENCOUNTER — Encounter: Payer: Self-pay | Admitting: Nurse Practitioner

## 2022-09-03 ENCOUNTER — Ambulatory Visit: Payer: BC Managed Care – PPO | Admitting: Nurse Practitioner

## 2022-09-03 VITALS — BP 144/82 | HR 79 | Temp 97.2°F | Ht 68.0 in | Wt 310.8 lb

## 2022-09-03 DIAGNOSIS — M5441 Lumbago with sciatica, right side: Secondary | ICD-10-CM | POA: Diagnosis not present

## 2022-09-03 DIAGNOSIS — G8929 Other chronic pain: Secondary | ICD-10-CM

## 2022-09-03 DIAGNOSIS — Z6841 Body Mass Index (BMI) 40.0 and over, adult: Secondary | ICD-10-CM

## 2022-09-03 DIAGNOSIS — I10 Essential (primary) hypertension: Secondary | ICD-10-CM | POA: Insufficient documentation

## 2022-09-03 DIAGNOSIS — J4520 Mild intermittent asthma, uncomplicated: Secondary | ICD-10-CM | POA: Diagnosis not present

## 2022-09-03 MED ORDER — OLMESARTAN MEDOXOMIL-HCTZ 20-12.5 MG PO TABS: 1.0000 | ORAL_TABLET | Freq: Every day | ORAL | 1 refills | Status: DC

## 2022-09-03 MED ORDER — ALBUTEROL SULFATE HFA 108 (90 BASE) MCG/ACT IN AERS: INHALATION_SPRAY | RESPIRATORY_TRACT | 6 refills | Status: DC

## 2022-09-03 MED ORDER — ZEPBOUND 2.5 MG/0.5ML ~~LOC~~ SOAJ: 2.5000 mg | SUBCUTANEOUS | 1 refills | Status: DC

## 2022-09-03 NOTE — Assessment & Plan Note (Signed)
His BMI is 47.2 today.  He is interested in medication to help with weight loss.  He states that he has tried adjusting his diet, limiting calories, measuring portion sizes and this still has not helped with weight loss.  He also has been exercising, until recently when he reinjured his back.  Will have him start on stepdown 2.5 mg injection weekly.  Discussed possible side effects.  Follow-up in 6 to 8 weeks.

## 2022-09-03 NOTE — Patient Instructions (Signed)
It was great to see you!  I have refilled the medications we discussed.   I have also sent in zepbound injection to do once a week to help with weight loss. Make sure you are drinking plenty of water.   Let's follow-up in 2-3 months, sooner if you have concerns.  If a referral was placed today, you will be contacted for an appointment. Please note that routine referrals can sometimes take up to 3-4 weeks to process. Please call our office if you haven't heard anything after this time frame.  Take care,  Rodman Pickle, NP

## 2022-09-03 NOTE — Assessment & Plan Note (Signed)
Chronic, improving.  He states that he went to neurosurgery and received an epidural injection which has helped with his back pain and range of motion.  Continue collaboration recommendations from neurosurgery.

## 2022-09-03 NOTE — Progress Notes (Signed)
Established Patient Office Visit  Subjective   Patient ID: Brian Alvarez, male    DOB: [birth year]  Age: 36 y.o. MRN: [id number]  Chief Complaint  Patient presents with   Follow-up    Follow up blood pressure , out of medication , checking it at home running at 160/90, out of medication for 3-4 weeks     HPI  Brian Alvarez is here to follow-up on hypertension and acute on chronic back pain.  He went to see the neurosurgeon and states that he got a epidural injection a week ago.  His pain was worse for a few days afterwards, however is doing better.  He is hoping that he does not need surgery again.  He states that he has more movement and range of motion in his spine.  He also states that he has been out of his blood pressure medication for the last 3 weeks.  He is currently taking olmesartan-hydrochlorothiazide 20-12.5 mg daily.  He denies chest pain, shortness of breath.  He was also wondering if the [employer] injections are back in stock.  He is interested in medication to help with weight loss.  He states that he has tried limiting calories, measuring portions and he is only lost a few pounds with this.  He has not been able to exercise as much recently with his back pain.    ROS See pertinent positives and negatives per HPI.    Objective:     BP (!) 144/82 (BP Location: Right Arm, Cuff Size: Large)   Pulse 79   Temp (!) 97.2 F (36.2 C) (Temporal)   Ht 5\' 8"  (1.727 m)   Wt (!) 310 lb 12.8 oz (141 kg)   SpO2 96%   BMI 47.26 kg/m  BP Readings from Last 3 Encounters:  09/03/22 (!) 144/82  07/09/22 122/83  06/30/22 117/77   Wt Readings from Last 3 Encounters:  09/03/22 (!) 310 lb 12.8 oz (141 kg)  07/09/22 (!) 302 lb (137 kg)  06/30/22 (!) 303 lb (137.4 kg)      Physical Exam Vitals and nursing note reviewed.  Constitutional:      Appearance: Normal appearance.  HENT:     Head: Normocephalic.  Eyes:     Conjunctiva/sclera: Conjunctivae normal.  Cardiovascular:      Rate and Rhythm: Normal rate and regular rhythm.     Pulses: Normal pulses.     Heart sounds: Normal heart sounds.  Pulmonary:     Effort: Pulmonary effort is normal.     Breath sounds: Normal breath sounds.  Musculoskeletal:     Cervical back: Normal range of motion.  Skin:    General: Skin is warm.  Neurological:     General: No focal deficit present.     Mental Status: He is alert and oriented to person, place, and time.  Psychiatric:        Mood and Affect: Mood normal.        Behavior: Behavior normal.        Thought Content: Thought content normal.        Judgment: Judgment normal.      Assessment & Plan:   Problem List Items Addressed This Visit       Cardiovascular and Mediastinum   Primary hypertension    Blood pressure is up today at 144/82, however he has been out of his medication for the last 3 weeks.  Refill of olmesartan-hydrochlorothiazide 20-12.5 mg daily sent to the pharmacy.  Relevant Medications   olmesartan-hydrochlorothiazide (BENICAR HCT) 20-12.5 MG tablet     Respiratory   Asthma    Chronic, stable.  Albuterol inhaler refill sent to the pharmacy.      Relevant Medications   albuterol (VENTOLIN HFA) 108 (90 Base) MCG/ACT inhaler     Nervous and Auditory   Chronic right-sided low back pain with right-sided sciatica - Primary    Chronic, improving.  He states that he went to neurosurgery and received an epidural injection which has helped with his back pain and range of motion.  Continue collaboration recommendations from neurosurgery.      Relevant Medications   Tirzepatide-Weight Management (ZEPBOUND) 2.5 MG/0.5ML SOAJ     Other   Morbid obesity with body mass index of 45.0-49.9 in adult [facility])    His BMI is 47.2 today.  He is interested in medication to help with weight loss.  He states that he has tried adjusting his diet, limiting calories, measuring portion sizes and this still has not helped with weight loss.  He also has been  exercising, until recently when he reinjured his back.  Will have him start on stepdown 2.5 mg injection weekly.  Discussed possible side effects.  Follow-up in 6 to 8 weeks.      Relevant Medications   Tirzepatide-Weight Management (ZEPBOUND) 2.5 MG/0.5ML SOAJ    Return in about 3 months (around 12/03/2022) for 2-3 months weight management .    Gerre Scull, NP

## 2022-09-03 NOTE — Assessment & Plan Note (Signed)
Chronic, stable.  Albuterol inhaler refill sent to the pharmacy.

## 2022-09-03 NOTE — Assessment & Plan Note (Signed)
Blood pressure is up today at 144/82, however he has been out of his medication for the last 3 weeks.  Refill of olmesartan-hydrochlorothiazide 20-12.5 mg daily sent to the pharmacy.

## 2022-09-23 DIAGNOSIS — M5416 Radiculopathy, lumbar region: Secondary | ICD-10-CM | POA: Diagnosis not present

## 2022-11-27 ENCOUNTER — Other Ambulatory Visit: Payer: Self-pay | Admitting: Nurse Practitioner

## 2022-11-29 DIAGNOSIS — M5416 Radiculopathy, lumbar region: Secondary | ICD-10-CM | POA: Diagnosis not present

## 2022-12-03 ENCOUNTER — Encounter: Payer: Self-pay | Admitting: Nurse Practitioner

## 2022-12-03 ENCOUNTER — Ambulatory Visit (INDEPENDENT_AMBULATORY_CARE_PROVIDER_SITE_OTHER): Payer: BC Managed Care – PPO | Admitting: Nurse Practitioner

## 2022-12-03 VITALS — BP 124/60 | HR 90 | Temp 98.5°F | Ht 68.0 in | Wt 310.0 lb

## 2022-12-03 DIAGNOSIS — J4520 Mild intermittent asthma, uncomplicated: Secondary | ICD-10-CM

## 2022-12-03 DIAGNOSIS — R5382 Chronic fatigue, unspecified: Secondary | ICD-10-CM | POA: Diagnosis not present

## 2022-12-03 DIAGNOSIS — Z6841 Body Mass Index (BMI) 40.0 and over, adult: Secondary | ICD-10-CM

## 2022-12-03 DIAGNOSIS — I10 Essential (primary) hypertension: Secondary | ICD-10-CM | POA: Diagnosis not present

## 2022-12-03 DIAGNOSIS — G8929 Other chronic pain: Secondary | ICD-10-CM

## 2022-12-03 DIAGNOSIS — M5441 Lumbago with sciatica, right side: Secondary | ICD-10-CM | POA: Diagnosis not present

## 2022-12-03 MED ORDER — ALBUTEROL SULFATE HFA 108 (90 BASE) MCG/ACT IN AERS: INHALATION_SPRAY | RESPIRATORY_TRACT | 6 refills | Status: DC

## 2022-12-03 MED ORDER — AMPHETAMINE-DEXTROAMPHETAMINE 10 MG PO TABS: 10.0000 mg | ORAL_TABLET | Freq: Two times a day (BID) | ORAL | 0 refills | Status: DC

## 2022-12-03 MED ORDER — OLMESARTAN MEDOXOMIL-HCTZ 20-12.5 MG PO TABS: 1.0000 | ORAL_TABLET | Freq: Every day | ORAL | 1 refills | Status: DC

## 2022-12-03 MED ORDER — ALBUTEROL SULFATE (2.5 MG/3ML) 0.083% IN NEBU: 2.5000 mg | INHALATION_SOLUTION | Freq: Four times a day (QID) | RESPIRATORY_TRACT | 1 refills | Status: DC | PRN

## 2022-12-03 MED ORDER — ZEPBOUND 2.5 MG/0.5ML ~~LOC~~ SOAJ: 2.5000 mg | SUBCUTANEOUS | 1 refills | Status: DC

## 2022-12-03 MED ORDER — FLUTICASONE PROPIONATE HFA 44 MCG/ACT IN AERO: 2.0000 | INHALATION_SPRAY | Freq: Two times a day (BID) | RESPIRATORY_TRACT | 5 refills | Status: DC

## 2022-12-03 NOTE — Assessment & Plan Note (Signed)
Chronic, ongoing.  He has been having ongoing fatigue for the past several years.  He had a sleep study done several years ago which showed mild sleep apnea.  He trialed a CPAP, however it did not improve his symptoms.  Will check CMP, CBC, TSH, testosterone, vitamin D, vitamin B12.  He will schedule a lab visit to have this done in the morning.

## 2022-12-03 NOTE — Assessment & Plan Note (Signed)
Chronic, improving.  He states that he went to neurosurgery and received an epidural injection which has helped with his back pain and range of motion.  Continue collaboration recommendations from neurosurgery.

## 2022-12-03 NOTE — Assessment & Plan Note (Addendum)
Chronic, stable.  Albuterol inhaler and flovent refill sent to the pharmacy.

## 2022-12-03 NOTE — Patient Instructions (Signed)
It was great to see you!  Schedule a lab visit before 10am Monday-Friday.   We are trying the zepbound again. If your insurance doesn't cover this, we can start contrave, 1 tablet daily for 7 days, then 1 tablet twice a day (second dose at 1 or 2pm).   This will be covered for $99 a month at [facility] mail in pharmacy.  [email or web address]  Let's follow-up in 3 months, sooner if you have concerns.  If a referral was placed today, you will be contacted for an appointment. Please note that routine referrals can sometimes take up to 3-4 weeks to process. Please call our office if you haven't heard anything after this time frame.  Take care,  Vance Peper, NP

## 2022-12-03 NOTE — Assessment & Plan Note (Signed)
Chronic, stable.  BP today 124/60.  Continue olmesartan-hydrochlorothiazide 20-12.5 mg daily.  Refill sent to the pharmacy.  Check CMP, CBC, lipid panel today.  Follow-up in 6 months.

## 2022-12-03 NOTE — Progress Notes (Signed)
Established Patient Office Visit  Subjective   Patient ID: Brian Alvarez, male    DOB: 12-Feb-1986  Age: 37 y.o. MRN: [id number]  Chief Complaint  Patient presents with   Medicine Management    Rx refills and discuss other options for weight management    HPI  Brian Alvarez is here to follow-up on back pain, hypertension, and weight management.  He has been checking his blood pressure at home and it has been 130s to 140s/80s.  He is taking his olmesartan-hydrochlorothiazide 20-12.5 mg daily.  He denies chest pain and shortness of breath.  He has been trying to lose weight with eating out less frequently and trying to increase exercise at home.  He has been lifting light weights and walking on it 100 as treadmill during work meetings.  His insurance did not cover Zepbound, however it has changed in January and he would like to try this again.  He has been having ongoing fatigue.  He had a sleep study done several years ago which showed mild sleep apnea.  He used a CPAP for several months however this did not improve his fatigue.  He would like to get labs checked today.  He states that his ADHD is well-controlled.  He is taking his Adderall 10 mg daily as needed.  He is not having any side effects to the medication.  He states that his lower back pain is doing better.  He has been getting injections in his back which has been helping control the pain.  He is still following with neurosurgery.    ROS See pertinent positives and negatives per HPI.    Objective:     BP 124/60 (BP Location: Right Arm, Cuff Size: Large)   Pulse 90   Temp 98.5 F (36.9 C)   Ht 5\' 8"  (1.727 m)   Wt (!) 310 lb (140.6 kg)   SpO2 96%   BMI 47.14 kg/m  BP Readings from Last 3 Encounters:  12/03/22 124/60  09/03/22 (!) 144/82  07/09/22 122/83   Wt Readings from Last 3 Encounters:  12/03/22 (!) 310 lb (140.6 kg)  09/03/22 (!) 310 lb 12.8 oz (141 kg)  07/09/22 (!) 302 lb (137 kg)      Physical  Exam Vitals and nursing note reviewed.  Constitutional:      Appearance: Normal appearance.  HENT:     Head: Normocephalic.  Eyes:     Conjunctiva/sclera: Conjunctivae normal.  Cardiovascular:     Rate and Rhythm: Normal rate and regular rhythm.     Pulses: Normal pulses.     Heart sounds: Normal heart sounds.  Pulmonary:     Effort: Pulmonary effort is normal.     Breath sounds: Normal breath sounds.  Musculoskeletal:     Cervical back: Normal range of motion.  Skin:    General: Skin is warm.  Neurological:     General: No focal deficit present.     Mental Status: He is alert and oriented to person, place, and time.  Psychiatric:        Mood and Affect: Mood normal.        Behavior: Behavior normal.        Thought Content: Thought content normal.        Judgment: Judgment normal.      Assessment & Plan:   Problem List Items Addressed This Visit       Cardiovascular and Mediastinum   Primary hypertension    Chronic, stable.  BP  today 124/60.  Continue olmesartan-hydrochlorothiazide 20-12.5 mg daily.  Refill sent to the pharmacy.  Check CMP, CBC, lipid panel today.  Follow-up in 6 months.      Relevant Medications   olmesartan-hydrochlorothiazide (BENICAR HCT) 20-12.5 MG tablet   Other Relevant Orders   CBC   Comprehensive metabolic panel   Lipid panel     Respiratory   Asthma    Chronic, stable.  Albuterol inhaler and flovent refill sent to the pharmacy.      Relevant Medications   albuterol (PROVENTIL) (2.5 MG/3ML) 0.083% nebulizer solution   albuterol (VENTOLIN HFA) 108 (90 Base) MCG/ACT inhaler   fluticasone (FLOVENT HFA) 44 MCG/ACT inhaler     Nervous and Auditory   Chronic right-sided low back pain with right-sided sciatica    Chronic, improving.  He states that he went to neurosurgery and received an epidural injection which has helped with his back pain and range of motion.  Continue collaboration recommendations from neurosurgery.      Relevant  Medications   tirzepatide (ZEPBOUND) 2.5 MG/0.5ML Pen   amphetamine-dextroamphetamine (ADDERALL) 10 MG tablet     Other   Morbid obesity with body mass index of 45.0-49.9 in adult [facility])    His BMI is 47.1 today.  He is interested in medication to help with weight loss.  He states that he has tried adjusting his diet, limiting calories, measuring portion sizes and this still has not helped with weight loss.  He also has been exercising, until recently when he reinjured his back.  Will have him start on Zepbound 2.5 mg injection weekly.  Discussed possible side effects.  Also discussed Contrave in case insurance does not cover Zepbound.  Follow-up in 6 to 8 weeks.      Relevant Medications   tirzepatide (ZEPBOUND) 2.5 MG/0.5ML Pen   amphetamine-dextroamphetamine (ADDERALL) 10 MG tablet   Other Relevant Orders   Hemoglobin A1c   Chronic fatigue - Primary    Chronic, ongoing.  He has been having ongoing fatigue for the past several years.  He had a sleep study done several years ago which showed mild sleep apnea.  He trialed a CPAP, however it did not improve his symptoms.  Will check CMP, CBC, TSH, testosterone, vitamin D, vitamin B12.  He will schedule a lab visit to have this done in the morning.      Relevant Orders   CBC   Comprehensive metabolic panel   Testosterone   TSH   Vitamin B12   VITAMIN D 25 Hydroxy (Vit-D Deficiency, Fractures)    Return in about 3 months (around 03/05/2023) for weight .    Charyl Dancer, NP

## 2022-12-03 NOTE — Assessment & Plan Note (Signed)
His BMI is 47.1 today.  He is interested in medication to help with weight loss.  He states that he has tried adjusting his diet, limiting calories, measuring portion sizes and this still has not helped with weight loss.  He also has been exercising, until recently when he reinjured his back.  Will have him start on Zepbound 2.5 mg injection weekly.  Discussed possible side effects.  Also discussed Contrave in case insurance does not cover Zepbound.  Follow-up in 6 to 8 weeks.

## 2022-12-06 ENCOUNTER — Other Ambulatory Visit (INDEPENDENT_AMBULATORY_CARE_PROVIDER_SITE_OTHER): Payer: BC Managed Care – PPO

## 2022-12-06 ENCOUNTER — Encounter: Payer: Self-pay | Admitting: Nurse Practitioner

## 2022-12-06 DIAGNOSIS — Z6841 Body Mass Index (BMI) 40.0 and over, adult: Secondary | ICD-10-CM | POA: Diagnosis not present

## 2022-12-06 DIAGNOSIS — I10 Essential (primary) hypertension: Secondary | ICD-10-CM

## 2022-12-06 DIAGNOSIS — R5382 Chronic fatigue, unspecified: Secondary | ICD-10-CM | POA: Diagnosis not present

## 2022-12-06 DIAGNOSIS — R7989 Other specified abnormal findings of blood chemistry: Secondary | ICD-10-CM

## 2022-12-06 LAB — COMPREHENSIVE METABOLIC PANEL
ALT: 17 U/L (ref 0–53)
AST: 15 U/L (ref 0–37)
Albumin: 3.6 g/dL (ref 3.5–5.2)
Alkaline Phosphatase: 50 U/L (ref 39–117)
BUN: 14 mg/dL (ref 6–23)
CO2: 25 mEq/L (ref 19–32)
Calcium: 8.5 mg/dL (ref 8.4–10.5)
Chloride: 105 mEq/L (ref 96–112)
Creatinine, Ser: 0.92 mg/dL (ref 0.40–1.50)
GFR: 105.52 mL/min (ref >60.00)
Glucose, Bld: 106 mg/dL — ABNORMAL HIGH (ref 70–99)
Potassium: 4 mEq/L (ref 3.5–5.1)
Sodium: 140 mEq/L (ref 135–145)
Total Bilirubin: 0.4 mg/dL (ref 0.2–1.2)
Total Protein: 5.7 g/dL — ABNORMAL LOW (ref 6.0–8.3)

## 2022-12-06 LAB — LIPID PANEL
Cholesterol: 135 mg/dL (ref 0–200)
HDL: 31.9 mg/dL — ABNORMAL LOW (ref >39.00)
LDL Cholesterol: 86 mg/dL (ref 0–99)
NonHDL: 103.14
Total CHOL/HDL Ratio: 4
Triglycerides: 87 mg/dL (ref 0.0–149.0)
VLDL: 17.4 mg/dL (ref 0.0–40.0)

## 2022-12-06 LAB — CBC
HCT: 40 % (ref 39.0–52.0)
Hemoglobin: 13.5 g/dL (ref 13.0–17.0)
MCHC: 33.6 g/dL (ref 30.0–36.0)
MCV: 84.8 fl (ref 78.0–100.0)
Platelets: 223 10*3/uL (ref 150.0–400.0)
RBC: 4.72 Mil/uL (ref 4.22–5.81)
RDW: 13.3 % (ref 11.5–15.5)
WBC: 6 10*3/uL (ref 4.0–10.5)

## 2022-12-06 LAB — VITAMIN B12: Vitamin B-12: 268 pg/mL (ref 211–911)

## 2022-12-06 LAB — HEMOGLOBIN A1C: Hgb A1c MFr Bld: 5.9 % (ref 4.6–6.5)

## 2022-12-06 LAB — VITAMIN D 25 HYDROXY (VIT D DEFICIENCY, FRACTURES): VITD: 24.5 ng/mL — ABNORMAL LOW (ref 30.00–100.00)

## 2022-12-06 LAB — TESTOSTERONE: Testosterone: 179.72 ng/dL — ABNORMAL LOW (ref 300.00–890.00)

## 2022-12-06 LAB — TSH: TSH: 0.43 u[IU]/mL (ref 0.35–5.50)

## 2022-12-06 MED ORDER — VITAMIN D (ERGOCALCIFEROL) 1.25 MG (50000 UNIT) PO CAPS: 50000.0000 [IU] | ORAL_CAPSULE | ORAL | 0 refills | Status: DC

## 2022-12-06 NOTE — Progress Notes (Signed)
Pt is here for labs   

## 2022-12-24 ENCOUNTER — Encounter: Payer: Self-pay | Admitting: Nurse Practitioner

## 2022-12-24 NOTE — Progress Notes (Unsigned)
[facility] [address] [place] [id number] Dept: [phone] Dept Fax: [phone]  Virtual Video Visit  I connected with Brian LernerJames Alvarez on 12/27/22 at  9:40 AM EDT by a video enabled telemedicine application and verified that I am speaking with the correct person using two identifiers.  Location patient: Home Location provider: Clinic Persons participating in the virtual visit: Patient; Rodman PickleLauren Luka Reisch, NP; Malena PeerBrittany Martin Smith, CMA  I discussed the limitations of evaluation and management by telemedicine and the availability of in person appointments. The patient expressed understanding and agreed to proceed.  Chief Complaint  Patient presents with   Sinusitis    For several weeks   Back Pain    worsening    SUBJECTIVE:  HPI: Brian LernerJames Soldo is a 37 y.o. male who presents with congestion and sinus pressure that has been going on for the past few weeks. He also notes that he is having ongoing back pain.   He had a spinal injection on 11/29/22 to help with pain and states that helped with the pain for about a week and then it came back. The pain will radiate down his right leg. The pain fluctuates in intensity. He is taking ibuprofen and tylenol which has not helped a lot. He has a follow-up appointment with neurosurgery on 12/30/22. He had tried gabapentin the past, however it made him feel bad - foggy, irritable.   UPPER RESPIRATORY TRACT INFECTION  Fever: no Cough: yes Shortness of breath: no Wheezing: no Chest pain: no Chest tightness: no Chest congestion: no Nasal congestion: yes Runny nose: yes Post nasal drip: yes Sneezing: no Sore throat: yes Swollen glands: no Sinus pressure: yes Headache: yes Face pain: no Toothache: no Ear pain: no bilateral Ear pressure: no bilateral Eyes red/itching:yes Eye drainage/crusting: no  Vomiting: no Rash: no Fatigue: yes Sick contacts: yes - daughter and wife Strep  contacts: no  Context: fluctuating Recurrent sinusitis: no Relief with OTC cold/cough medications: no  Treatments attempted: ibuprofen, tylenol, mucinex  Patient Active Problem List   Diagnosis Date Noted   Chronic fatigue 12/03/2022   Primary hypertension 09/03/2022   Acute maxillary sinusitis 07/09/2022   Controlled substance agreement signed 06/07/2022   Chronic right-sided low back pain with right-sided sciatica 06/07/2022   Parasomnia, unspecified 10/19/2017   Reactive airway disease 10/19/2017   Confusional arousals 03/28/2017   PLMD (periodic limb movement disorder) 11/24/2016   Morbid obesity with body mass index of 45.0-49.9 in adult 11/24/2016   Hypersomnia with sleep apnea 11/24/2016   Snoring 11/24/2016   Attention deficit hyperactivity disorder (ADHD) 04/29/2016   Seasonal allergies 09/12/2012   Asthma 12/26/2011   Carpal tunnel syndrome 11/11/2011    Past Surgical History:  Procedure Laterality Date   LUMBAR DISC SURGERY      Family History  Problem Relation Age of Onset   Drug abuse Mother    Heart failure Mother    Hypertension Mother    Arthritis Maternal Grandmother    Heart disease Maternal Grandfather    Colon cancer Neg Hx    Stomach cancer Neg Hx    Esophageal cancer Neg Hx     Social History   Tobacco Use   Smoking status: Former    Types: Cigarettes    Quit date: 2014    Years since quitting: 10.2   Smokeless tobacco: Never   Tobacco comments:    has switched to electronic cigarette  Vaping Use   Vaping Use: Some days  Substance Use Topics   Alcohol use: Yes    Comment: occassional   Drug use: No     Current Outpatient Medications:    doxycycline (VIBRA-TABS) 100 MG tablet, Take 1 tablet (100 mg total) by mouth 2 (two) times daily., Disp: 20 tablet, Rfl: 0   pregabalin (LYRICA) 75 MG capsule, Take 1 capsule (75 mg total) by mouth daily., Disp: 30 capsule, Rfl: 0   albuterol (PROVENTIL) (2.5 MG/3ML) 0.083% nebulizer solution,  Take 3 mLs (2.5 mg total) by nebulization every 6 (six) hours as needed for wheezing or shortness of breath., Disp: 150 mL, Rfl: 1   albuterol (VENTOLIN HFA) 108 (90 Base) MCG/ACT inhaler, INHALE 2 PUFFS INTO THE LUNGS EVERY 4 HOURS AS NEEDED FOR WHEEZING OR SHORTNESS OF BREATH, Disp: 18 g, Rfl: 6   amphetamine-dextroamphetamine (ADDERALL) 10 MG tablet, Take 1 tablet (10 mg total) by mouth 2 (two) times daily., Disp: 60 tablet, Rfl: 0   cholecalciferol (VITAMIN D3) 25 MCG (1000 UNIT) tablet, Take 2,000 Units by mouth daily., Disp: , Rfl:    EPINEPHrine (EPIPEN 2-PAK) 0.3 mg/0.3 mL IJ SOAJ injection, Inject 0.3 mLs (0.3 mg total) into the muscle as needed for anaphylaxis., Disp: 2 each, Rfl: 0   fluticasone (FLONASE) 50 MCG/ACT nasal spray, Place 2 sprays into both nostrils daily., Disp: 16 g, Rfl: 0   fluticasone (FLOVENT HFA) 44 MCG/ACT inhaler, Inhale 2 puffs into the lungs in the morning and at bedtime., Disp: 1 each, Rfl: 5   ibuprofen (ADVIL) 600 MG tablet, Take 1 tablet (600 mg total) by mouth every 6 (six) hours as needed., Disp: 30 tablet, Rfl: 0   Multiple Vitamins-Minerals (MULTIVITAMIN WITH MINERALS) tablet, Take 1 tablet by mouth daily., Disp: , Rfl:    olmesartan-hydrochlorothiazide (BENICAR HCT) 20-12.5 MG tablet, Take 1 tablet by mouth daily., Disp: 90 tablet, Rfl: 1   tirzepatide (ZEPBOUND) 2.5 MG/0.5ML Pen, Inject 2.5 mg into the skin once a week., Disp: 2 mL, Rfl: 1   traZODone (DESYREL) 50 MG tablet, Take 50 mg by mouth at bedtime., Disp: , Rfl:    Vitamin D, Ergocalciferol, (DRISDOL) 1.25 MG (50000 UNIT) CAPS capsule, Take 1 capsule (50,000 Units total) by mouth every 14 (fourteen) days., Disp: 6 capsule, Rfl: 0  Allergies  Allergen Reactions   Penicillins     Hives Has patient had a PCN reaction causing immediate rash, facial/tongue/throat swelling, SOB or lightheadedness with hypotension: yes Has patient had a PCN reaction causing severe rash involving mucus membranes or skin  necrosis: yes Has patient had a PCN reaction that required hospitalization: no Has patient had a PCN reaction occurring within the last 10 years: yes If all of the above answers are "NO", then may proceed with Cephalosporin use.   Rosemary Oil     ROS: See pertinent positives and negatives per HPI.  OBSERVATIONS/OBJECTIVE:  VITALS per patient if applicable: There were no vitals filed for this visit. There is no height or weight on file to calculate BMI.    GENERAL: Alert and oriented. Appears well and in no acute distress.  HEENT: Atraumatic. Conjunctiva clear. No obvious abnormalities on inspection of external nose and ears.  NECK: Normal movements of the head and neck.  LUNGS: On inspection, no signs of respiratory distress. Breathing rate appears normal. No obvious gross SOB, gasping or wheezing, and no conversational dyspnea.  CV: No obvious cyanosis.  MS: Moves all visible extremities without noticeable abnormality.  PSYCH/NEURO: Pleasant and cooperative. No obvious depression or anxiety.  Speech and thought processing grossly intact.  ASSESSMENT AND PLAN:  Problem List Items Addressed This Visit       Nervous and Auditory   Chronic right-sided low back pain with right-sided sciatica    Chronic, not controlled.  He states that he had a second spinal injection on 11/29/2022 and it helped for about a week and then the pain came back.  The pain continues to radiate down the right leg when it is severe.  He has tried gabapentin in the past, however he states that it made him feel bad, foggy headed and irritable.  Will have him start Lyrica 75 mg daily.  PDMP reviewed.  He has an appointment with neurosurgery on Thursday, encouraged him to keep this appointment.  Follow-up in 4 weeks.      Relevant Medications   pregabalin (LYRICA) 75 MG capsule   Other Visit Diagnoses     Acute non-recurrent sinusitis, unspecified location    -  Primary   Treat with doxycycline BID x10  days. Encourage fluids. Can continue OTC mucinex prn. F/U if not improving.   Relevant Medications   doxycycline (VIBRA-TABS) 100 MG tablet        I discussed the assessment and treatment plan with the patient. The patient was provided an opportunity to ask questions and all were answered. The patient agreed with the plan and demonstrated an understanding of the instructions.   The patient was advised to call back or seek an in-person evaluation if the symptoms worsen or if the condition fails to improve as anticipated.   Gerre Scull, NP

## 2022-12-27 ENCOUNTER — Encounter: Payer: Self-pay | Admitting: Nurse Practitioner

## 2022-12-27 ENCOUNTER — Telehealth (INDEPENDENT_AMBULATORY_CARE_PROVIDER_SITE_OTHER): Payer: BC Managed Care – PPO | Admitting: Nurse Practitioner

## 2022-12-27 DIAGNOSIS — J019 Acute sinusitis, unspecified: Secondary | ICD-10-CM | POA: Diagnosis not present

## 2022-12-27 DIAGNOSIS — M5441 Lumbago with sciatica, right side: Secondary | ICD-10-CM | POA: Diagnosis not present

## 2022-12-27 DIAGNOSIS — G8929 Other chronic pain: Secondary | ICD-10-CM | POA: Diagnosis not present

## 2022-12-27 MED ORDER — PREGABALIN 75 MG PO CAPS: 75.0000 mg | ORAL_CAPSULE | Freq: Every day | ORAL | 0 refills | Status: DC

## 2022-12-27 MED ORDER — DOXYCYCLINE HYCLATE 100 MG PO TABS: 100.0000 mg | ORAL_TABLET | Freq: Two times a day (BID) | ORAL | 0 refills | Status: DC

## 2022-12-27 NOTE — Assessment & Plan Note (Signed)
Chronic, not controlled.  He states that he had a second spinal injection on 11/29/2022 and it helped for about a week and then the pain came back.  The pain continues to radiate down the right leg when it is severe.  He has tried gabapentin in the past, however he states that it made him feel bad, foggy headed and irritable.  Will have him start Lyrica 75 mg daily.  PDMP reviewed.  He has an appointment with neurosurgery on Thursday, encouraged him to keep this appointment.  Follow-up in 4 weeks.

## 2022-12-27 NOTE — Patient Instructions (Signed)
It was great to see you!  Start doxycyline twice a day with food.   Start lyrica once a day for your back pain. Keep your appointment with neurosurgery.   Let's follow-up in 4 weeks, sooner if you have concerns.  If a referral was placed today, you will be contacted for an appointment. Please note that routine referrals can sometimes take up to 3-4 weeks to process. Please call our office if you haven't heard anything after this time frame.  Take care,  Rodman Pickle, NP

## 2022-12-30 DIAGNOSIS — M5416 Radiculopathy, lumbar region: Secondary | ICD-10-CM | POA: Diagnosis not present

## 2023-01-04 ENCOUNTER — Ambulatory Visit: Payer: BC Managed Care – PPO | Admitting: Nurse Practitioner

## 2023-01-28 ENCOUNTER — Ambulatory Visit (INDEPENDENT_AMBULATORY_CARE_PROVIDER_SITE_OTHER): Payer: BC Managed Care – PPO | Admitting: Nurse Practitioner

## 2023-01-28 VITALS — BP 124/82 | HR 80 | Temp 97.5°F | Ht 68.0 in | Wt 308.4 lb

## 2023-01-28 DIAGNOSIS — M5441 Lumbago with sciatica, right side: Secondary | ICD-10-CM | POA: Diagnosis not present

## 2023-01-28 DIAGNOSIS — G8929 Other chronic pain: Secondary | ICD-10-CM

## 2023-01-28 DIAGNOSIS — J3081 Allergic rhinitis due to animal (cat) (dog) hair and dander: Secondary | ICD-10-CM

## 2023-01-28 DIAGNOSIS — E291 Testicular hypofunction: Secondary | ICD-10-CM | POA: Diagnosis not present

## 2023-01-28 DIAGNOSIS — R7989 Other specified abnormal findings of blood chemistry: Secondary | ICD-10-CM

## 2023-01-28 LAB — TESTOSTERONE: Testosterone: 190.56 ng/dL — ABNORMAL LOW (ref 300.00–890.00)

## 2023-01-28 MED ORDER — OLMESARTAN MEDOXOMIL-HCTZ 20-12.5 MG PO TABS: 1.0000 | ORAL_TABLET | Freq: Every day | ORAL | 1 refills | Status: DC

## 2023-01-28 MED ORDER — AMPHETAMINE-DEXTROAMPHETAMINE 10 MG PO TABS: 10.0000 mg | ORAL_TABLET | Freq: Two times a day (BID) | ORAL | 0 refills | Status: DC

## 2023-01-28 MED ORDER — EPINEPHRINE 0.3 MG/0.3ML IJ SOAJ: 0.3000 mg | INTRAMUSCULAR | 0 refills | Status: AC | PRN

## 2023-01-28 NOTE — Progress Notes (Signed)
Established Patient Office Visit  Subjective   Patient ID: Brian Alvarez, male    DOB: October 29, 1985  Age: 37 y.o. MRN: [id number]  Chief Complaint  Patient presents with   Back Pain    L4-L5, recheck testosterone levels.Rx refills    HPI  Brian Alvarez is here to follow-up on ongoing right lower back pain with sciatica.  He states that he had a second lumbar injection which did not help with the pain.  He is still following up with neurosurgery.  His insurance denied a third injection as a second 1 did not help with any of the pain.  He has been taking the Lyrica which helps but does make him slightly drowsy.  He has also been alternating Tylenol and ibuprofen and doing stretches.  He would like to get his testosterone rechecked again today and if it is low he would like referral to urology.  He has been having ongoing fatigue and tiredness.    ROS See pertinent positives and negatives per HPI.    Objective:     BP 124/82 (BP Location: Right Arm)   Pulse 80   Temp (!) 97.5 F (36.4 C)   Ht 5\' 8"  (1.727 m)   Wt (!) 308 lb 6.4 oz (139.9 kg)   SpO2 98%   BMI 46.89 kg/m    Physical Exam Vitals and nursing note reviewed.  Constitutional:      Appearance: Normal appearance.  HENT:     Head: Normocephalic.  Eyes:     Conjunctiva/sclera: Conjunctivae normal.  Cardiovascular:     Rate and Rhythm: Normal rate and regular rhythm.     Pulses: Normal pulses.     Heart sounds: Normal heart sounds.  Pulmonary:     Effort: Pulmonary effort is normal.     Breath sounds: Normal breath sounds.  Musculoskeletal:        General: Tenderness (right lower back) present. No swelling or deformity.     Cervical back: Normal range of motion.  Skin:    General: Skin is warm.  Neurological:     General: No focal deficit present.     Mental Status: He is alert and oriented to person, place, and time.  Psychiatric:        Mood and Affect: Mood normal.        Behavior: Behavior normal.         Thought Content: Thought content normal.        Judgment: Judgment normal.      Assessment & Plan:   Problem List Items Addressed This Visit       Nervous and Auditory   Chronic right-sided low back pain with right-sided sciatica - Primary    Chronic, not controlled.  He has had a third spinal injection which did not help with the pain.  His insurance is not covering any more injections.  He states that the Lyrica did help some, however does make him drowsy.  He will continue this daily as needed along with Tylenol and ibuprofen.  Continue collaboration recommendations from neurosurgery.  Follow-up if symptoms worsen or with any concerns.      Relevant Medications   amphetamine-dextroamphetamine (ADDERALL) 10 MG tablet   Other Visit Diagnoses     Allergy to dogs       Epi pen refill sent to the pharmacy.   Relevant Medications   EPINEPHrine (EPIPEN 2-PAK) 0.3 mg/0.3 mL IJ SOAJ injection   Low testosterone  Recheck testosterone today, if still low will refer to urology.       Return if symptoms worsen or fail to improve.    Gerre Scull, NP

## 2023-01-28 NOTE — Addendum Note (Signed)
Addended by: Rodman Pickle A on: 01/28/2023 10:06 PM   Modules accepted: Orders

## 2023-01-28 NOTE — Patient Instructions (Signed)
It was great to see you!  Keep following with neurosurgery. You can take pregabalin as needed for pain.   We are checking your labs today and will let you know the results via mychart/phone.   If you testosterone is still low, I will refer you to urology.   Let's follow-up in 3 months, sooner if you have concerns.  If a referral was placed today, you will be contacted for an appointment. Please note that routine referrals can sometimes take up to 3-4 weeks to process. Please call our office if you haven't heard anything after this time frame.  Take care,  Rodman Pickle, NP

## 2023-01-28 NOTE — Assessment & Plan Note (Signed)
Chronic, not controlled.  He has had a third spinal injection which did not help with the pain.  His insurance is not covering any more injections.  He states that the Lyrica did help some, however does make him drowsy.  He will continue this daily as needed along with Tylenol and ibuprofen.  Continue collaboration recommendations from neurosurgery.  Follow-up if symptoms worsen or with any concerns.

## 2023-02-12 IMAGING — CR DG CHEST 2V
2 series · 2 of 2 positions shown · non-contrast
Comparison: None.

CLINICAL DATA: Cough for 2 months

EXAM:
CHEST - 2 VIEW

[w chest pa]
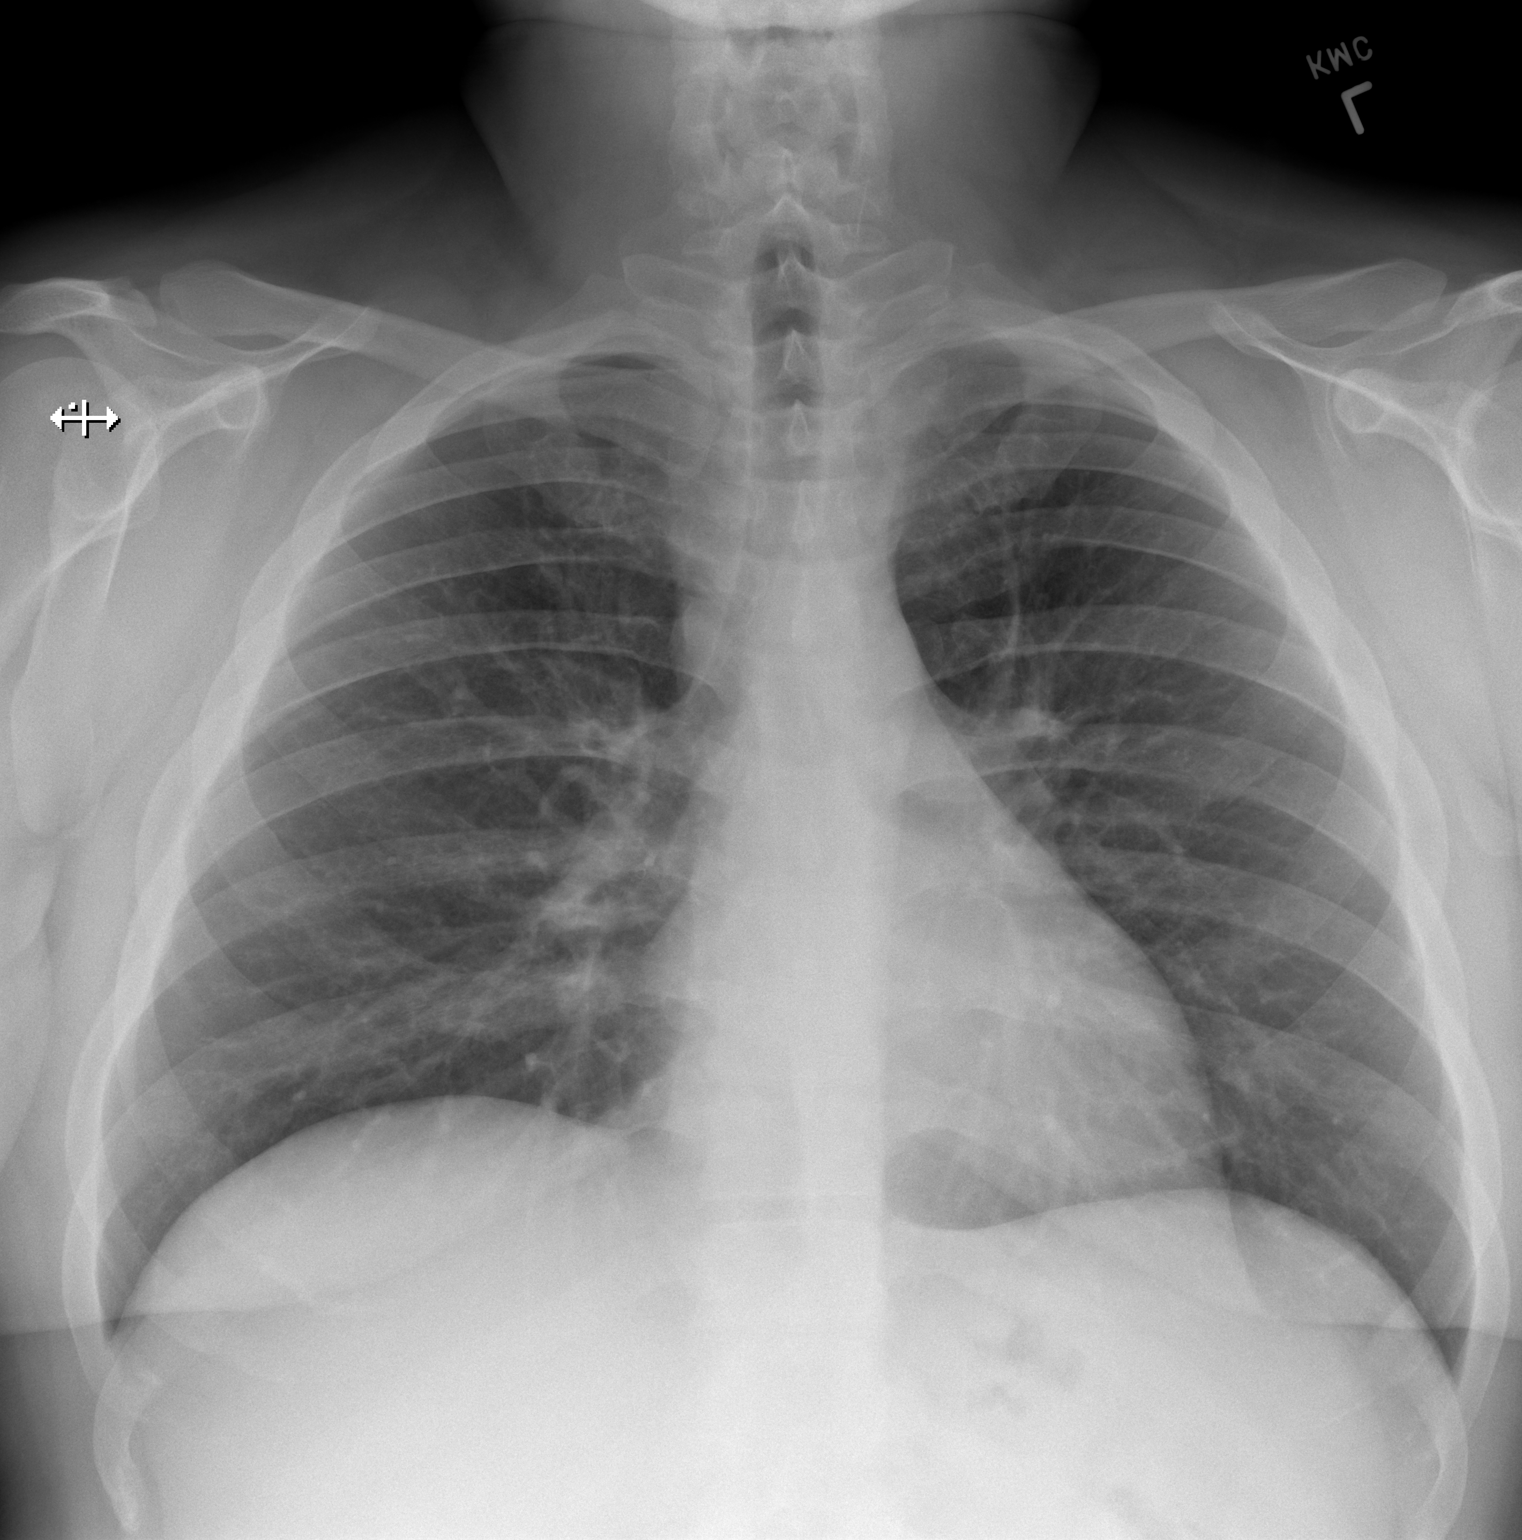

[w chest lat]
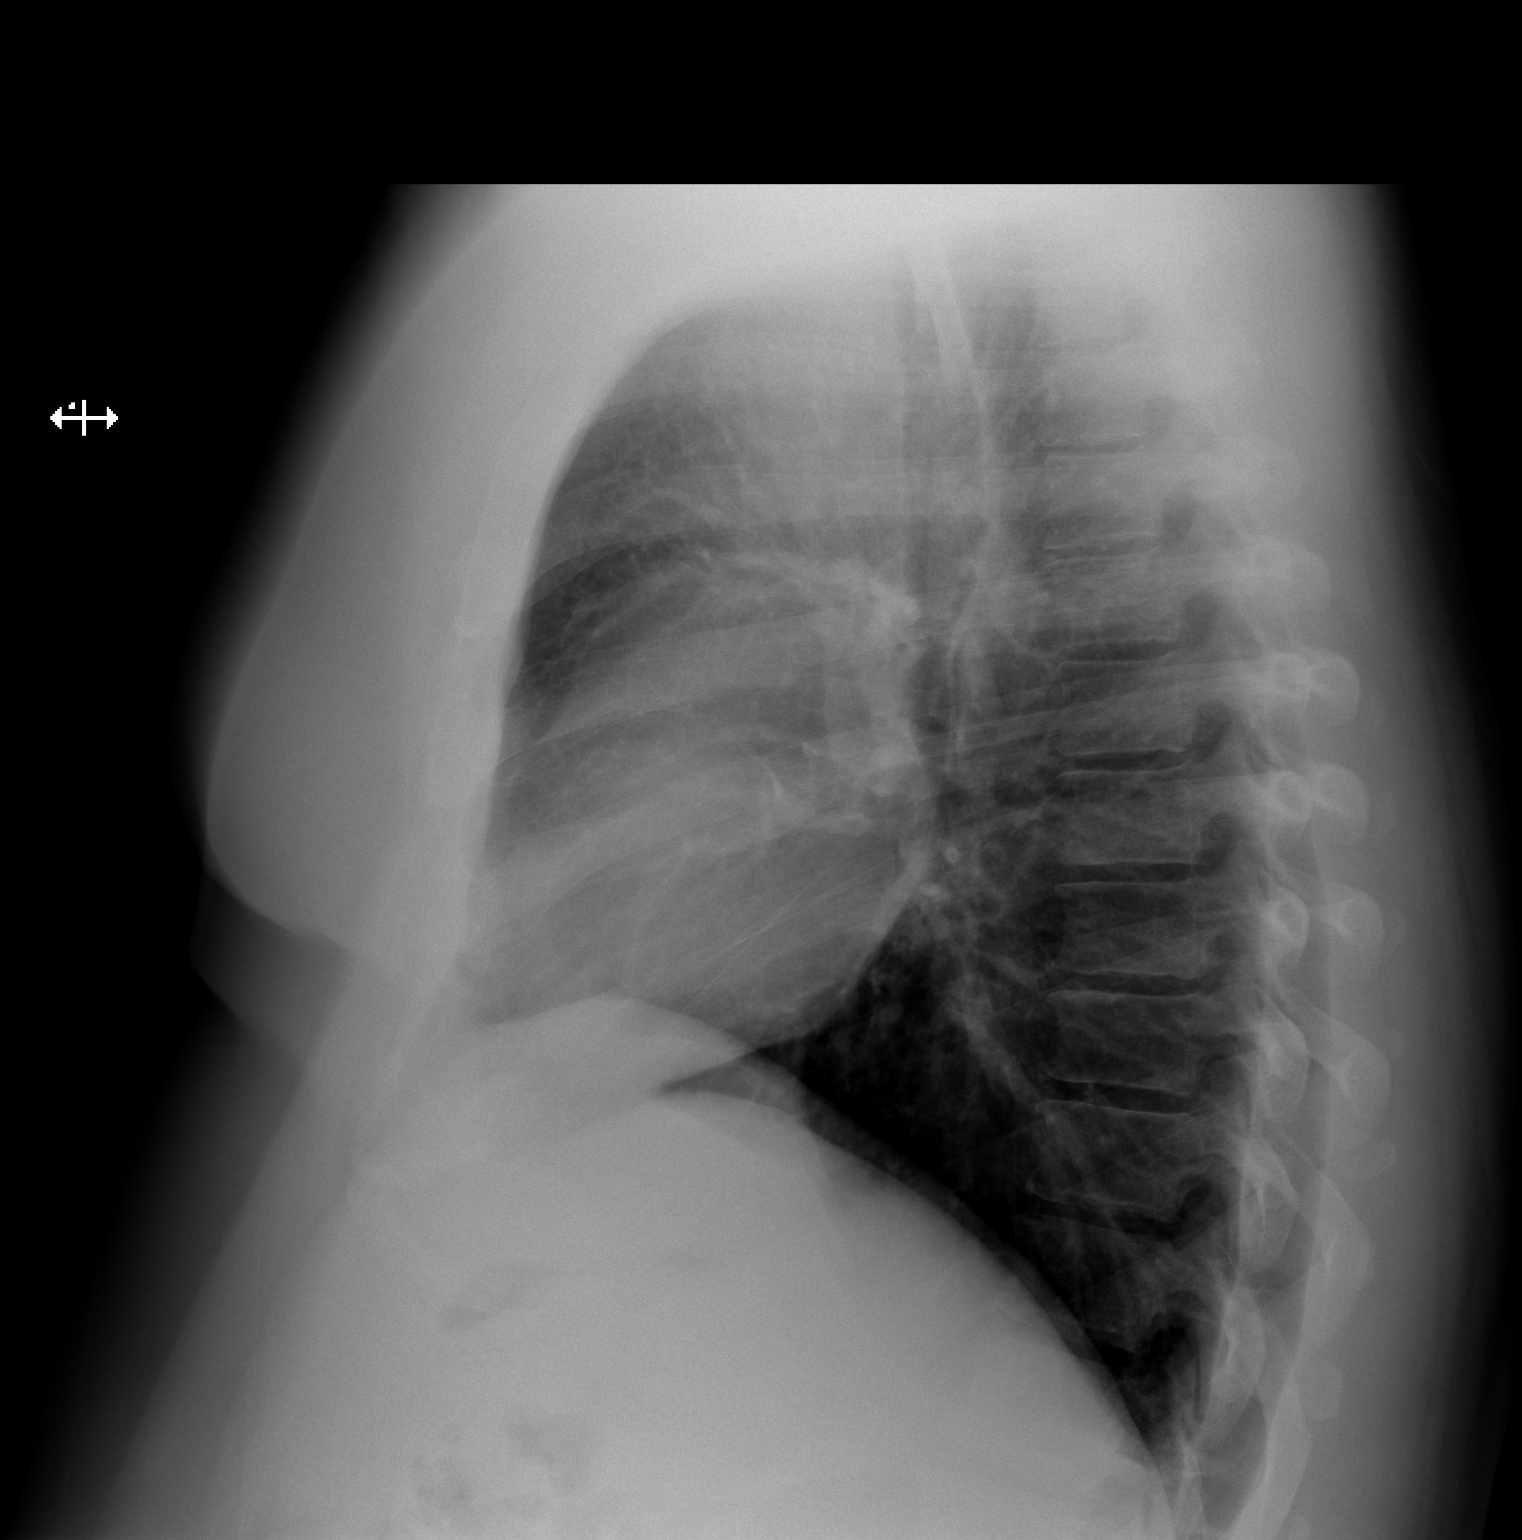

[2 of 2 positions shown; findings below may reference images not displayed]

FINDINGS: The heart size and mediastinal contours are within normal limits.
Both lungs are clear. The visualized skeletal structures are
unremarkable.
IMPRESSION: No acute abnormality of the lungs.

## 2023-02-28 ENCOUNTER — Other Ambulatory Visit: Payer: Self-pay | Admitting: Nurse Practitioner

## 2023-03-07 ENCOUNTER — Ambulatory Visit: Payer: BC Managed Care – PPO | Admitting: Nurse Practitioner

## 2023-03-07 ENCOUNTER — Encounter: Payer: Self-pay | Admitting: Nurse Practitioner

## 2023-03-07 VITALS — BP 118/86 | HR 88 | Temp 97.9°F | Ht 68.0 in | Wt 307.4 lb

## 2023-03-07 DIAGNOSIS — Z6841 Body Mass Index (BMI) 40.0 and over, adult: Secondary | ICD-10-CM

## 2023-03-07 DIAGNOSIS — G8929 Other chronic pain: Secondary | ICD-10-CM | POA: Diagnosis not present

## 2023-03-07 DIAGNOSIS — M5441 Lumbago with sciatica, right side: Secondary | ICD-10-CM | POA: Diagnosis not present

## 2023-03-07 NOTE — Progress Notes (Signed)
Established Patient Office Visit  Subjective   Patient ID: Brian Alvarez, male    DOB: September 14, 1986  Age: 37 y.o. MRN: [id number]  Chief Complaint  Patient presents with   Weight Management Screening    Follow up    HPI  Andrei Roldan is here to follow-up on chronic back pain and weight management.   He states that the back pain is ongoing. He has good and bad days. He has an appointment coming up with neurosurgery in the near future. He states this insurance is not covering injections and plans on talking about next steps with possible surgeries.   He has not started the zepbound yet. He states that it is not currently affordable. He states that he tends to overeat on calories, although he has been tracking and trying to take in about 2,000 calories a day. He has been unable to exercise as much due to his back pain.     ROS See pertinent positives and negatives per HPI.    Objective:     BP 118/86 (BP Location: Left Wrist)   Pulse 88   Temp 97.9 F (36.6 C)   Ht 5\' 8"  (1.727 m)   Wt (!) 307 lb 6.4 oz (139.4 kg)   SpO2 96%   BMI 46.74 kg/m  BP Readings from Last 3 Encounters:  03/07/23 118/86  01/28/23 124/82  12/03/22 124/60   Wt Readings from Last 3 Encounters:  03/07/23 (!) 307 lb 6.4 oz (139.4 kg)  01/28/23 (!) 308 lb 6.4 oz (139.9 kg)  12/03/22 (!) 310 lb (140.6 kg)   Physical Exam Vitals and nursing note reviewed.  Constitutional:      Appearance: Normal appearance.  HENT:     Head: Normocephalic.  Eyes:     Conjunctiva/sclera: Conjunctivae normal.  Cardiovascular:     Rate and Rhythm: Normal rate and regular rhythm.     Pulses: Normal pulses.     Heart sounds: Normal heart sounds.  Pulmonary:     Effort: Pulmonary effort is normal.     Breath sounds: Normal breath sounds.  Musculoskeletal:     Cervical back: Normal range of motion.  Skin:    General: Skin is warm.  Neurological:     General: No focal deficit present.     Mental Status: He is  alert and oriented to person, place, and time.  Psychiatric:        Mood and Affect: Mood normal.        Behavior: Behavior normal.        Thought Content: Thought content normal.        Judgment: Judgment normal.      Assessment & Plan:   Problem List Items Addressed This Visit       Nervous and Auditory   Chronic right-sided low back pain with right-sided sciatica - Primary    Chronic, ongoing. He is currently following with neurosurgery and has an appointment coming up in the future. He plans to discuss next options since insurance won't pay for future injections.         Other   Morbid obesity with body mass index of 45.0-49.9 in adult (HCC)    BMI 46.7.  He did not start the Zepbound since he was unable to afford it.  He has been trying to track his calories.  Discussed trying to limit his calories to 2000 to 2200/day.  We did also discuss the possibility of starting Contrave.  He would like to look  into this medication first.  Encouraged him to reach out if he would like to start it.  Follow-up in 3 months or sooner with concerns.       Return in about 3 months (around 06/07/2023) for weight management .    Gerre Scull, NP

## 2023-03-07 NOTE — Assessment & Plan Note (Signed)
Chronic, ongoing. He is currently following with neurosurgery and has an appointment coming up in the future. He plans to discuss next options since insurance won't pay for future injections.

## 2023-03-07 NOTE — Assessment & Plan Note (Signed)
BMI 46.7.  He did not start the Zepbound since he was unable to afford it.  He has been trying to track his calories.  Discussed trying to limit his calories to 2000 to 2200/day.  We did also discuss the possibility of starting Contrave.  He would like to look into this medication first.  Encouraged him to reach out if he would like to start it.  Follow-up in 3 months or sooner with concerns.

## 2023-03-07 NOTE — Patient Instructions (Signed)
It was great to see you!  Look into contrave and see what you think about this weight loss medication.   See if your insurance covers this and how much it would cost.   We have a pharmacy associated with contrave where it is $99 a month with or without insurance.   Let's follow-up in 3 months, sooner if you have concerns.  If a referral was placed today, you will be contacted for an appointment. Please note that routine referrals can sometimes take up to 3-4 weeks to process. Please call our office if you haven't heard anything after this time frame.  Take care,  Rodman Pickle, NP

## 2023-04-29 ENCOUNTER — Telehealth: Payer: BC Managed Care – PPO | Admitting: Nurse Practitioner

## 2023-04-29 DIAGNOSIS — K047 Periapical abscess without sinus: Secondary | ICD-10-CM

## 2023-04-29 MED ORDER — CLINDAMYCIN HCL 300 MG PO CAPS
300.0000 mg | ORAL_CAPSULE | Freq: Three times a day (TID) | ORAL | 0 refills | Status: AC
Start: 2023-04-29 — End: 2023-05-06

## 2023-04-29 NOTE — Progress Notes (Signed)
E-Visit for Dental Pain  We are sorry that you are not feeling well.  Here is how we plan to help!  Based on what you have shared with me in the questionnaire, it sounds like you have an infection in your teeth/gums  Clindamycin 300mg  3 times a day for 7 days  It is imperative that you see a dentist within 10 days of this eVisit to determine the cause of the dental pain and be sure it is adequately treated  A toothache or tooth pain is caused when the nerve in the root of a tooth or surrounding a tooth is irritated. Dental (tooth) infection, decay, injury, or loss of a tooth are the most common causes of dental pain. Pain may also occur after an extraction (tooth is pulled out). Pain sometimes originates from other areas and radiates to the jaw, thus appearing to be tooth pain.Bacteria growing inside your mouth can contribute to gum disease and dental decay, both of which can cause pain. A toothache occurs from inflammation of the central portion of the tooth called pulp. The pulp contains nerve endings that are very sensitive to pain. Inflammation to the pulp or pulpitis may be caused by dental cavities, trauma, and infection.    HOME CARE:   For toothaches: Over-the-counter pain medications such as acetaminophen or ibuprofen may be used. Take these as directed on the package while you arrange for a dental appointment. Avoid very cold or hot foods, because they may make the pain worse. You may get relief from biting on a cotton ball soaked in oil of cloves. You can get oil of cloves at most drug stores.  For jaw pain:  Aspirin may be helpful for problems in the joint of the jaw in adults. If pain happens every time you open your mouth widely, the temporomandibular joint (TMJ) may be the source of the pain. Yawning or taking a large bite of food may worsen the pain. An appointment with your doctor or dentist will help you find the cause.     GET HELP RIGHT AWAY IF:  You have a high fever  or chills If you have had a recent head or face injury and develop headache, light headedness, nausea, vomiting, or other symptoms that concern you after an injury to your face or mouth, you could have a more serious injury in addition to your dental injury. A facial rash associated with a toothache: This condition may improve with medication. Contact your doctor for them to decide what is appropriate. Any jaw pain occurring with chest pain: Although jaw pain is most commonly caused by dental disease, it is sometimes referred pain from other areas. People with heart disease, especially people who have had stents placed, people with diabetes, or those who have had heart surgery may have jaw pain as a symptom of heart attack or angina. If your jaw or tooth pain is associated with lightheadedness, sweating, or shortness of breath, you should see a doctor as soon as possible. Trouble swallowing or excessive pain or bleeding from gums: If you have a history of a weakened immune system, diabetes, or steroid use, you may be more susceptible to infections. Infections can often be more severe and extensive or caused by unusual organisms. Dental and gum infections in people with these conditions may require more aggressive treatment. An abscess may need draining or IV antibiotics, for example.  MAKE SURE YOU   Understand these instructions. Will watch your condition. Will get help right away  if you are not doing well or get worse.  Thank you for choosing an e-visit.  Your e-visit answers were reviewed by a board certified advanced clinical practitioner to complete your personal care plan. Depending upon the condition, your plan could have included both over the counter or prescription medications.  Please review your pharmacy choice. Make sure the pharmacy is open so you can pick up prescription now. If there is a problem, you may contact your provider through [employer] and have the prescription routed  to another pharmacy.  Your safety is important to [place]. If you have drug allergies check your prescription carefully.   For the next 24 hours you can use MyChart to ask questions about today's visit, request a non-urgent call back, or ask for a work or school excuse. You will get an email in the next two days asking about your experience. I hope that your e-visit has been valuable and will speed your recovery.   Meds ordered this encounter  Medications   clindamycin (CLEOCIN) 300 MG capsule    Sig: Take 1 capsule (300 mg total) by mouth 3 (three) times daily for 7 days.    Dispense:  21 capsule    Refill:  0     I spent approximately 5 minutes reviewing the patient's history, current symptoms and coordinating their care today.

## 2023-05-19 ENCOUNTER — Other Ambulatory Visit: Payer: Self-pay | Admitting: Nurse Practitioner

## 2023-05-19 NOTE — Telephone Encounter (Signed)
Requesting: VITAMIN D2 50,000IU (ERGO) CAP RX  Last Visit: 03/07/2023 Next Visit: Visit date not found Last Refill: 02/28/2023  Please Advise

## 2023-05-20 NOTE — Telephone Encounter (Signed)
LVM to return call.

## 2023-05-24 ENCOUNTER — Telehealth: Payer: Self-pay | Admitting: Nurse Practitioner

## 2023-05-24 NOTE — Telephone Encounter (Signed)
Patient notified of message to stop Rx of Vitamin D and start Vitamin D 5,000 units daily

## 2023-05-24 NOTE — Telephone Encounter (Signed)
Pt called and said he is returning your call. Please give him a call back

## 2023-05-24 NOTE — Telephone Encounter (Signed)
LVM to return call.

## 2023-05-24 NOTE — Telephone Encounter (Signed)
I returned patient's call and documented in previous phone encounter

## 2023-05-27 ENCOUNTER — Ambulatory Visit (INDEPENDENT_AMBULATORY_CARE_PROVIDER_SITE_OTHER): Payer: BC Managed Care – PPO | Admitting: Nurse Practitioner

## 2023-05-27 ENCOUNTER — Encounter: Payer: Self-pay | Admitting: Nurse Practitioner

## 2023-05-27 VITALS — BP 116/68 | HR 94 | Temp 97.1°F | Ht 68.0 in | Wt 314.0 lb

## 2023-05-27 DIAGNOSIS — Z23 Encounter for immunization: Secondary | ICD-10-CM

## 2023-05-27 DIAGNOSIS — R229 Localized swelling, mass and lump, unspecified: Secondary | ICD-10-CM

## 2023-05-27 LAB — POCT URINALYSIS DIPSTICK
Bilirubin, UA: NEGATIVE
Blood, UA: NEGATIVE
Glucose, UA: NEGATIVE
Ketones, UA: NEGATIVE
Leukocytes, UA: NEGATIVE
Nitrite, UA: NEGATIVE
Protein, UA: NEGATIVE
Spec Grav, UA: 1.015 (ref 1.010–1.025)
Urobilinogen, UA: 0.2 E.U./dL
pH, UA: 6.5 (ref 5.0–8.0)

## 2023-05-27 NOTE — Progress Notes (Signed)
   Acute Office Visit  Subjective:     Patient ID: Brian Alvarez, male    DOB: [birth year], 37 y.o.   MRN: [id number]  Chief Complaint  Patient presents with   Pelvic Pain    Lump on right side up from genitals, flu vaccine    HPI Patient is in today for pain and lump on the right side of his pelvis.  He states that he noticed this about a week ago and was having intermittent pain.  He took some Tylenol which does help with the pain.  He at first thought it might be a kidney stone and was drinking plenty of water.  He denies dysuria, urinary frequency, constipation, diarrhea, fever.  ROS See pertinent positives and negatives per HPI.     Objective:    BP 116/68 (BP Location: Right Arm)   Pulse 94   Temp (!) 97.1 F (36.2 C)   Ht 5\' 8"  (1.727 m)   Wt (!) 314 lb (142.4 kg)   SpO2 98%   BMI 47.74 kg/m    Physical Exam Vitals and nursing note reviewed. Exam conducted with a chaperone present.  Constitutional:      Appearance: Normal appearance.  HENT:     Head: Normocephalic.  Eyes:     Conjunctiva/sclera: Conjunctivae normal.  Pulmonary:     Effort: Pulmonary effort is normal.  Musculoskeletal:     Cervical back: Normal range of motion.  Skin:    General: Skin is warm.     Comments: Lump under skin palpated in right inguinal area  Neurological:     General: No focal deficit present.     Mental Status: He is alert and oriented to person, place, and time.  Psychiatric:        Mood and Affect: Mood normal.        Behavior: Behavior normal.        Thought Content: Thought content normal.        Judgment: Judgment normal.     Results for orders placed or performed in visit on 05/27/23  POCT urinalysis dipstick  Result Value Ref Range   Color, UA     Clarity, UA     Glucose, UA Negative Negative   Bilirubin, UA Negative    Ketones, UA Negative    Spec Grav, UA 1.015 1.010 - 1.025   Blood, UA Negative    pH, UA 6.5 5.0 - 8.0   Protein, UA Negative Negative    Urobilinogen, UA 0.2 0.2 or 1.0 E.U./dL   Nitrite, UA Negative    Leukocytes, UA Negative Negative   Appearance     Odor          Assessment & Plan:   Problem List Items Addressed This Visit   None Visit Diagnoses     Lump of skin    -  Primary   U/A negative. Concern for hernia. Will check ultrasound and f/u based on results. Continue tylenol as needed for pain.   Relevant Orders   [place] RT LOWER EXTREM LTD SOFT TISSUE NON VASCULAR   POCT urinalysis dipstick (Completed)   Immunization due       Flu vaccine given today   Relevant Orders   Flu vaccine trivalent PF, 6mos and older(Flulaval,Afluria,Fluarix,Fluzone) (Completed)       No orders of the defined types were placed in this encounter.   Return if symptoms worsen or fail to improve.  Gerre Scull, NP

## 2023-05-27 NOTE — Patient Instructions (Signed)
It was great to see you!  I have ordered an ultrasound of your groin for further evaluation.   Let's follow-up based on results  Take care,  Rodman Pickle, NP

## 2023-05-30 ENCOUNTER — Telehealth: Payer: Self-pay | Admitting: Nurse Practitioner

## 2023-05-30 NOTE — Telephone Encounter (Signed)
[place] Jun [phone] choose opt 1 then opt 5  They need to know if the procedure is above the belly button or below.

## 2023-05-30 NOTE — Telephone Encounter (Signed)
I called and spoke with Brian Alvarez and notified her that it was below belly button.

## 2023-06-01 ENCOUNTER — Ambulatory Visit
Admission: RE | Admit: 2023-06-01 | Discharge: 2023-06-01 | Disposition: A | Discharge status: Home / Self Care | Payer: BC Managed Care – PPO | Source: Ambulatory Visit | Attending: Nurse Practitioner | Admitting: Nurse Practitioner

## 2023-06-01 ENCOUNTER — Other Ambulatory Visit: Payer: BC Managed Care – PPO

## 2023-06-01 DIAGNOSIS — R109 Unspecified abdominal pain: Secondary | ICD-10-CM | POA: Diagnosis not present

## 2023-06-01 DIAGNOSIS — R229 Localized swelling, mass and lump, unspecified: Secondary | ICD-10-CM

## 2023-06-08 ENCOUNTER — Encounter: Payer: Self-pay | Admitting: Nurse Practitioner

## 2023-06-09 NOTE — Telephone Encounter (Signed)
I called Imaging again this morning and LVM for someone to call me back. [phone].

## 2023-06-09 NOTE — Telephone Encounter (Signed)
I called [facility]iology Reading [facility] and they are sending the report.

## 2023-06-10 ENCOUNTER — Other Ambulatory Visit: Payer: Self-pay | Admitting: Family

## 2023-06-10 DIAGNOSIS — K4091 Unilateral inguinal hernia, without obstruction or gangrene, recurrent: Secondary | ICD-10-CM

## 2023-06-14 ENCOUNTER — Encounter: Payer: Self-pay | Admitting: Nurse Practitioner

## 2023-06-23 ENCOUNTER — Other Ambulatory Visit: Payer: Self-pay | Admitting: Surgery

## 2023-06-23 DIAGNOSIS — R1909 Other intra-abdominal and pelvic swelling, mass and lump: Secondary | ICD-10-CM

## 2023-06-28 ENCOUNTER — Inpatient Hospital Stay
Admission: RE | Admit: 2023-06-28 | Discharge: 2023-06-28 | Discharge status: Home / Self Care | Payer: BC Managed Care – PPO | Source: Ambulatory Visit | Attending: Surgery

## 2023-06-28 DIAGNOSIS — R1909 Other intra-abdominal and pelvic swelling, mass and lump: Secondary | ICD-10-CM

## 2023-06-28 DIAGNOSIS — R1031 Right lower quadrant pain: Secondary | ICD-10-CM | POA: Diagnosis not present

## 2023-06-28 DIAGNOSIS — R222 Localized swelling, mass and lump, trunk: Secondary | ICD-10-CM | POA: Diagnosis not present

## 2023-06-28 MED ORDER — IOPAMIDOL (ISOVUE-300) INJECTION 61%
100.0000 mL | Freq: Once | INTRAVENOUS | Status: AC | PRN
  Administered 2023-06-28: 100 mL via INTRAVENOUS

## 2023-07-21 DIAGNOSIS — L02214 Cutaneous abscess of groin: Secondary | ICD-10-CM | POA: Diagnosis not present

## 2023-08-20 ENCOUNTER — Ambulatory Visit
Admission: EM | Admit: 2023-08-20 | Discharge: 2023-08-20 | Disposition: A | Discharge status: Home / Self Care | Payer: BC Managed Care – PPO | Attending: Physician Assistant | Admitting: Physician Assistant

## 2023-08-20 ENCOUNTER — Encounter: Payer: Self-pay | Admitting: Emergency Medicine

## 2023-08-20 ENCOUNTER — Other Ambulatory Visit: Payer: Self-pay

## 2023-08-20 DIAGNOSIS — J329 Chronic sinusitis, unspecified: Secondary | ICD-10-CM | POA: Diagnosis not present

## 2023-08-20 DIAGNOSIS — J40 Bronchitis, not specified as acute or chronic: Secondary | ICD-10-CM

## 2023-08-20 MED ORDER — PREDNISONE 20 MG PO TABS
40.0000 mg | ORAL_TABLET | Freq: Every day | ORAL | 0 refills | Status: AC
Start: 2023-08-20 — End: 2023-08-25

## 2023-08-20 MED ORDER — DOXYCYCLINE HYCLATE 100 MG PO CAPS: 100.0000 mg | ORAL_CAPSULE | Freq: Two times a day (BID) | ORAL | 0 refills | Status: DC

## 2023-08-20 NOTE — ED Provider Notes (Signed)
[name]    CSN: [id number] Arrival date & time: 08/20/23  1207      History   Chief Complaint Chief Complaint  Patient presents with   Cough    HPI Brian Alvarez is a 37 y.o. male.   Patient here today for evaluation of cough congestion has had for the last 9 days.  Cough is productive at times.  He is not fever.  Family numbers with similar symptoms.  He has taken over-the-counter medication without resolution.  The history is provided by the patient.  Cough Associated symptoms: wheezing   Associated symptoms: no chills, no ear pain, no eye discharge, no fever, no shortness of breath and no sore throat     Past Medical History:  Diagnosis Date   ADHD    Allergy    Anemia    Anxiety    Asthma    Carpal tunnel syndrome    Depression    HTN (hypertension)    Insomnia    Lumbar herniated disc    Obesity     Patient Active Problem List   Diagnosis Date Noted   Chronic fatigue 12/03/2022   Primary hypertension 09/03/2022   Acute maxillary sinusitis 07/09/2022   Controlled substance agreement signed 06/07/2022   Chronic right-sided low back pain with right-sided sciatica 06/07/2022   Parasomnia, unspecified 10/19/2017   Reactive airway disease 10/19/2017   Confusional arousals 03/28/2017   PLMD (periodic limb movement disorder) 11/24/2016   Morbid obesity with body mass index of 45.0-49.9 in adult [facility]) 11/24/2016   Hypersomnia with sleep apnea 11/24/2016   Snoring 11/24/2016   Attention deficit hyperactivity disorder (ADHD) 04/29/2016   Seasonal allergies 09/12/2012   Asthma 12/26/2011   Carpal tunnel syndrome 11/11/2011    Past Surgical History:  Procedure Laterality Date   LUMBAR DISC SURGERY         Home Medications    Prior to Admission medications   Medication Sig Start Date End Date Taking? Authorizing Provider  doxycycline (VIBRAMYCIN) 100 MG capsule Take 1 capsule (100 mg total) by mouth 2 (two) times daily. 08/20/23  Yes  Tomi Bamberger, PA-C  predniSONE (DELTASONE) 20 MG tablet Take 2 tablets (40 mg total) by mouth daily with breakfast for 5 days. 08/20/23 08/25/23 Yes Tomi Bamberger, PA-C  albuterol (PROVENTIL) (2.5 MG/3ML) 0.083% nebulizer solution Take 3 mLs (2.5 mg total) by nebulization every 6 (six) hours as needed for wheezing or shortness of breath. 12/03/22   McElwee, Lauren A, NP  albuterol (VENTOLIN HFA) 108 (90 Base) MCG/ACT inhaler INHALE 2 PUFFS INTO THE LUNGS EVERY 4 HOURS AS NEEDED FOR WHEEZING OR SHORTNESS OF BREATH 12/03/22   McElwee, Lauren A, NP  amphetamine-dextroamphetamine (ADDERALL) 10 MG tablet Take 1 tablet (10 mg total) by mouth 2 (two) times daily. 01/28/23   McElwee, Lauren A, NP  cholecalciferol (VITAMIN D3) 25 MCG (1000 UNIT) tablet Take 2,000 Units by mouth daily. Patient not taking: Reported on 05/27/2023    [provider]  EPINEPHrine (EPIPEN 2-PAK) 0.3 mg/0.3 mL IJ SOAJ injection Inject 0.3 mg into the muscle as needed for anaphylaxis. 01/28/23   McElwee, Lauren A, NP  fluticasone (FLONASE) 50 MCG/ACT nasal spray Place 2 sprays into both nostrils daily. 12/09/20   Domenick Gong, MD  fluticasone (FLOVENT HFA) 44 MCG/ACT inhaler Inhale 2 puffs into the lungs in the morning and at bedtime. 12/03/22   McElwee, Lauren A, NP  ibuprofen (ADVIL) 600 MG tablet Take 1 tablet (600 mg total)  by mouth every 6 (six) hours as needed. 12/09/20   Domenick Gong, MD  Multiple Vitamins-Minerals (MULTIVITAMIN WITH MINERALS) tablet Take 1 tablet by mouth daily.    [provider]  olmesartan-hydrochlorothiazide (BENICAR HCT) 20-12.5 MG tablet Take 1 tablet by mouth daily. 01/28/23   McElwee, Lauren A, NP  pregabalin (LYRICA) 75 MG capsule Take 1 capsule (75 mg total) by mouth daily. 12/27/22   McElwee, Lauren A, NP  tirzepatide (ZEPBOUND) 2.5 MG/0.5ML Pen Inject 2.5 mg into the skin once a week. Patient not taking: Reported on 05/27/2023 12/03/22   Gerre Scull, NP  traZODone  (DESYREL) 50 MG tablet Take 50 mg by mouth at bedtime. 12/14/21   [provider]  VITAMIN D PO Take by mouth.    [provider]  Vitamin D, Ergocalciferol, (DRISDOL) 1.25 MG (50000 UNIT) CAPS capsule TAKE 1 CAPSULE BY MOUTH EVERY 14 DAYS Patient not taking: Reported on 05/27/2023 02/28/23   Gerre Scull, NP  levocetirizine (XYZAL) 5 MG tablet Take 5 mg by mouth every evening.  12/09/20  [provider]  montelukast (SINGULAIR) 10 MG tablet Take one daily at bedtime Patient taking differently: Take 10 mg by mouth at bedtime.  10/22/13 01/13/20  Collene Gobble, MD    Family History Family History  Problem Relation Age of Onset   Drug abuse Mother    Heart failure Mother    Hypertension Mother    Arthritis Maternal Grandmother    Heart disease Maternal Grandfather    Colon cancer Neg Hx    Stomach cancer Neg Hx    Esophageal cancer Neg Hx     Social History Social History   Tobacco Use   Smoking status: Former    Current packs/day: 0.00    Types: Cigarettes    Quit date: 2014    Years since quitting: 10.9   Smokeless tobacco: Never   Tobacco comments:    has switched to electronic cigarette  Vaping Use   Vaping status: Some Days  Substance Use Topics   Alcohol use: Yes    Comment: occassional   Drug use: No     Allergies   Penicillins and Rosemary oil   Review of Systems Review of Systems  Constitutional:  Negative for chills and fever.  HENT:  Positive for congestion. Negative for ear pain and sore throat.   Eyes:  Negative for discharge and redness.  Respiratory:  Positive for cough and wheezing. Negative for shortness of breath.   Gastrointestinal:  Negative for abdominal pain, nausea and vomiting.     Physical Exam Triage Vital Signs ED Triage Vitals  Encounter Vitals Group     BP 08/20/23 1319 109/68     Systolic BP Percentile --      Diastolic BP Percentile --      Pulse Rate 08/20/23 1319 97     Resp 08/20/23 1319 18      Temp 08/20/23 1319 98.3 F (36.8 C)     Temp Source 08/20/23 1319 Oral     SpO2 08/20/23 1319 95 %     Weight --      Height --      Head Circumference --      Peak Flow --      Pain Score 08/20/23 1320 0     Pain Loc --      Pain Education --      Exclude from Growth Chart --    No data found.  Updated Vital  Signs BP 109/68 (BP Location: Right Arm)   Pulse 97   Temp 98.3 F (36.8 C) (Oral)   Resp 18   SpO2 95%      Physical Exam Vitals and nursing note reviewed.  Constitutional:      General: He is not in acute distress.    Appearance: Normal appearance. He is not ill-appearing.  HENT:     Head: Normocephalic and atraumatic.     Nose: Congestion present.     Mouth/Throat:     Mouth: Mucous membranes are moist.     Pharynx: Oropharynx is clear. No oropharyngeal exudate or posterior oropharyngeal erythema.  Eyes:     Conjunctiva/sclera: Conjunctivae normal.  Cardiovascular:     Rate and Rhythm: Normal rate and regular rhythm.     Heart sounds: Normal heart sounds. No murmur heard. Pulmonary:     Effort: Pulmonary effort is normal. No respiratory distress.     Breath sounds: Wheezing present. No rhonchi or rales.  Skin:    General: Skin is warm and dry.  Neurological:     Mental Status: He is alert.  Psychiatric:        Mood and Affect: Mood normal.        Thought Content: Thought content normal.      UC Treatments / Results  Labs (all labs ordered are listed, but only abnormal results are displayed) Labs Reviewed - No data to display  EKG   Radiology No results found.  Procedures Procedures (including critical care time)  Medications Ordered in UC Medications - No data to display  Initial Impression / Assessment and Plan / UC Course  I have reviewed the triage vital signs and the nursing notes.  Pertinent labs & imaging results that were available during my care of the patient were reviewed by me and considered in my medical decision making  (see chart for details).    Will treat to cover sinobronchitis with doxycycline and steroid burst.  Recommend follow-up if no gradual improvement with any further concerns.  Final Clinical Impressions(s) / UC Diagnoses   Final diagnoses:  Sinobronchitis   Discharge Instructions   None    ED Prescriptions     Medication Sig Dispense Auth. Provider   predniSONE (DELTASONE) 20 MG tablet Take 2 tablets (40 mg total) by mouth daily with breakfast for 5 days. 10 tablet Erma Pinto F, PA-C   doxycycline (VIBRAMYCIN) 100 MG capsule Take 1 capsule (100 mg total) by mouth 2 (two) times daily. 20 capsule Tomi Bamberger, PA-C      PDMP not reviewed this encounter.   Tomi Bamberger, PA-C 08/20/23 1352

## 2023-08-20 NOTE — ED Triage Notes (Signed)
Pt here for cough and congestion x 9 days; family members in home with same

## 2023-09-17 IMAGING — CT CT ABD-PELV W/ CM
2 of 5 series · 16 of 46 positions shown, 18 images · IV contrast (APPLIED)
Comparison: None Available.

CLINICAL DATA: Abdominal pain with persistent diarrhea.

EXAM:
CT ABDOMEN AND PELVIS WITH CONTRAST
TECHNIQUE: Multidetector CT imaging of the abdomen and pelvis was performed
using the standard protocol following bolus administration of
intravenous contrast.

[Series 2: abd pel w · axial · 0.94mm/px · z∈[+801,+1276]mm · 13 of 107 slices shown, 15 images]
[im 6/107  soft-tissue]
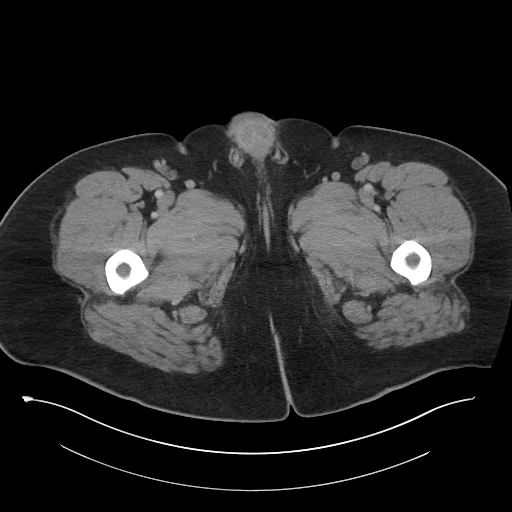
[im 6/107  bone]
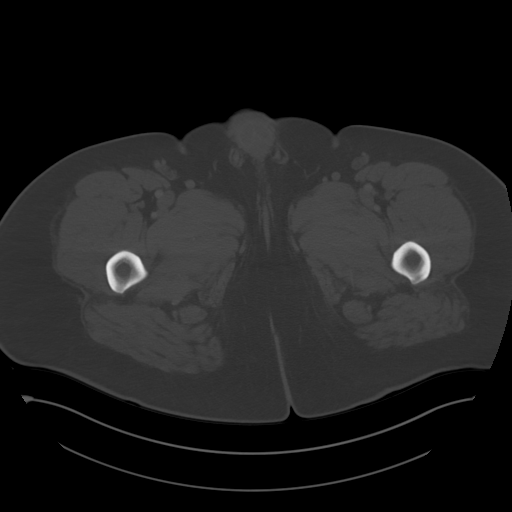
[im 16/107  soft-tissue]
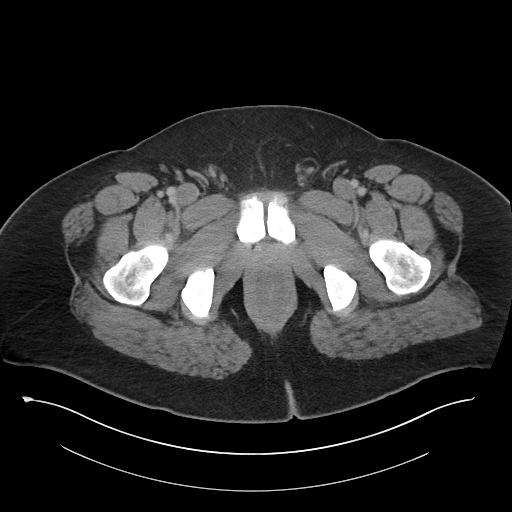
[im 22/107  soft-tissue]
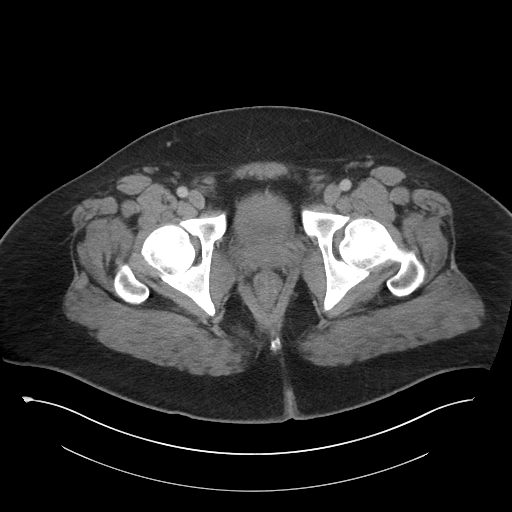
[im 32/107  soft-tissue]
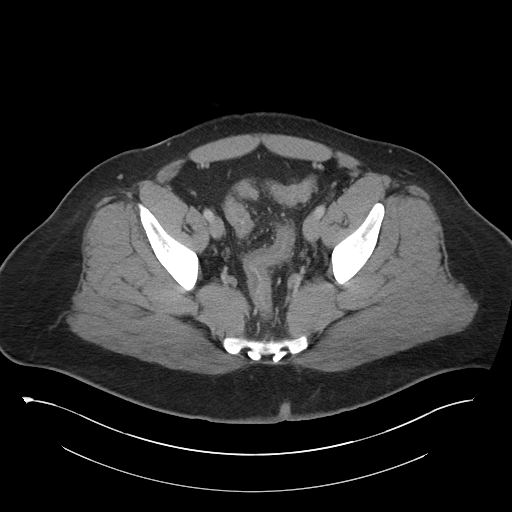
[im 38/107  soft-tissue]
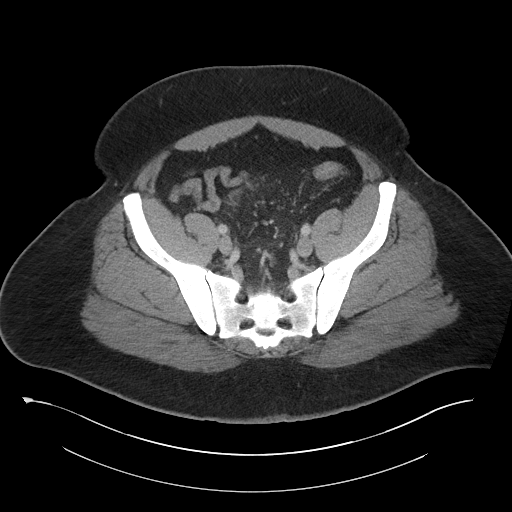
[im 48/107  soft-tissue]
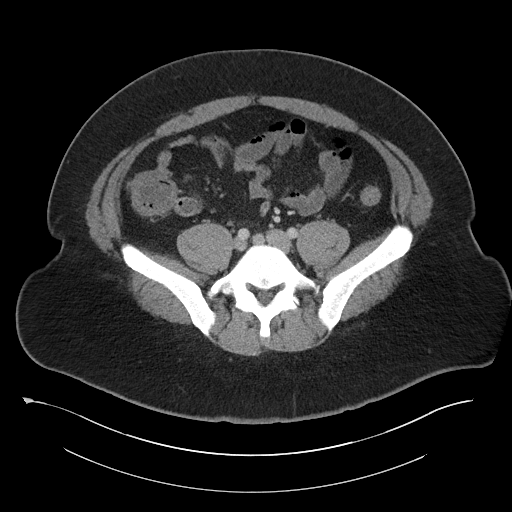
[im 54/107  soft-tissue]
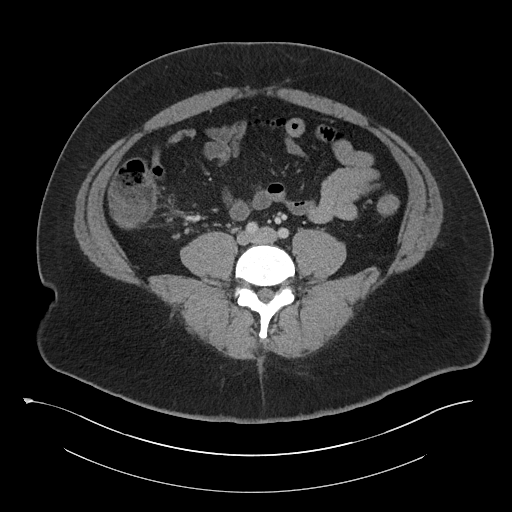
[im 59/107  soft-tissue]
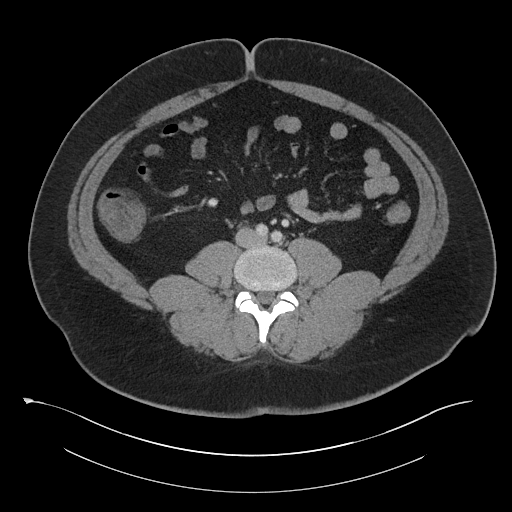
[im 69/107  soft-tissue]
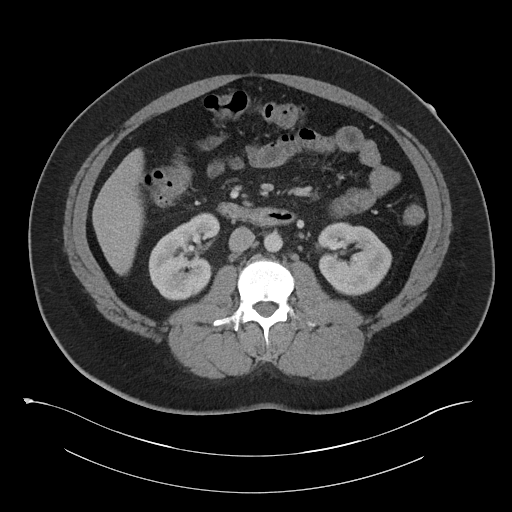
[im 69/107  bone]
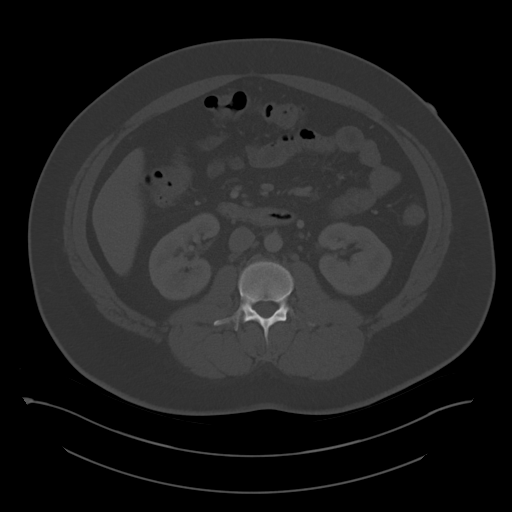
[im 75/107  soft-tissue]
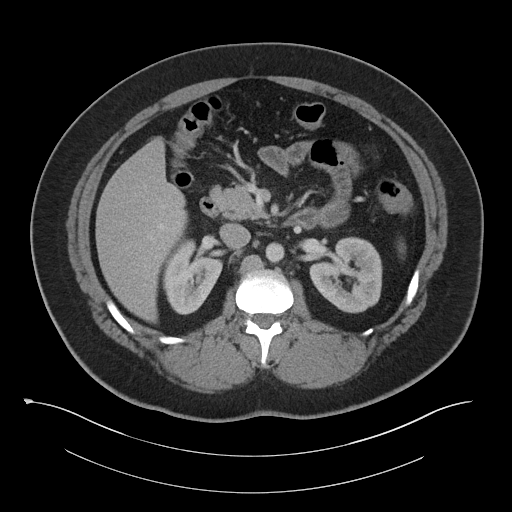
[im 85/107  soft-tissue]
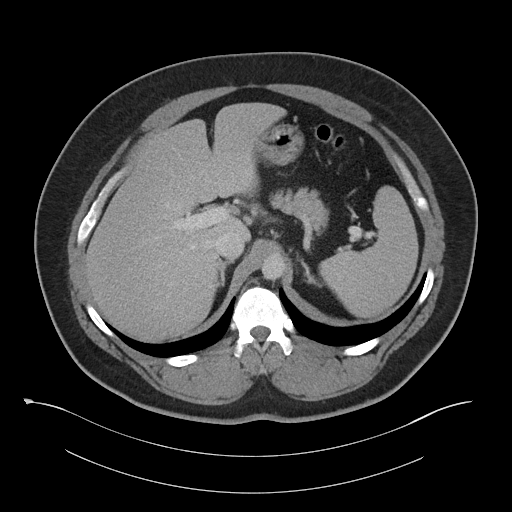
[im 91/107  soft-tissue]
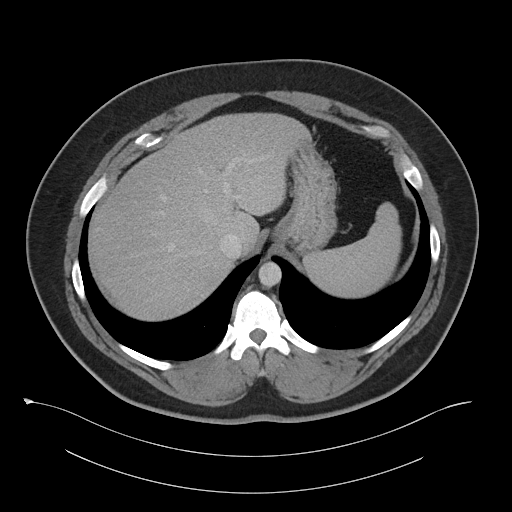
[im 101/107  soft-tissue]
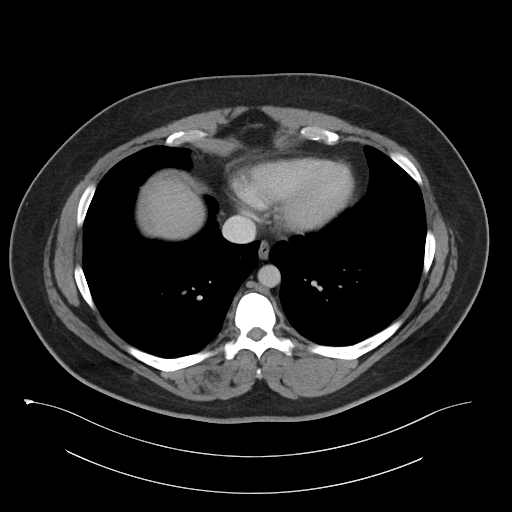

[Series 5: coronal · coronal · 0.97mm/px · 3 of 122 slices shown]
[im 41/122  soft-tissue]
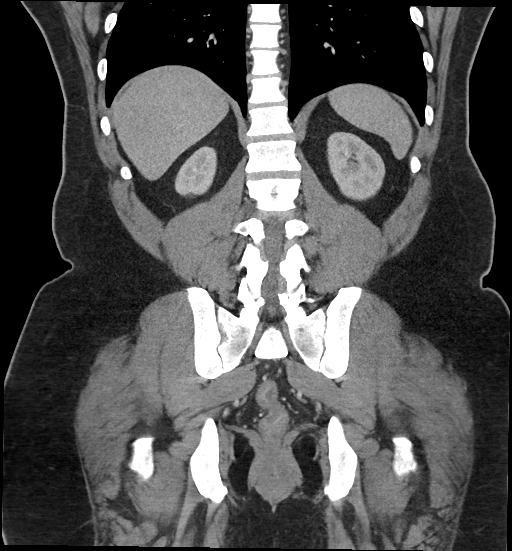
[im 54/122  soft-tissue]
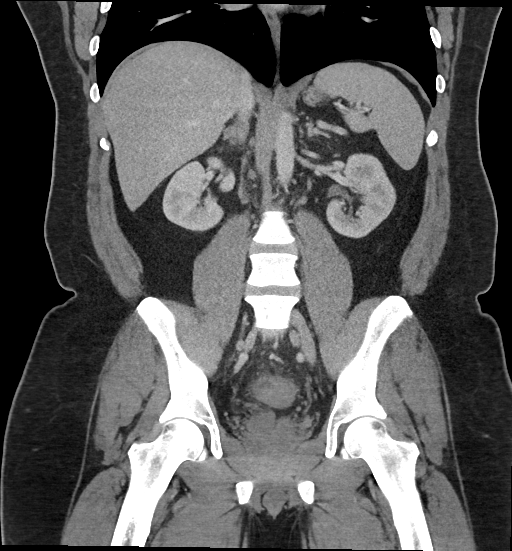
[im 68/122  soft-tissue]
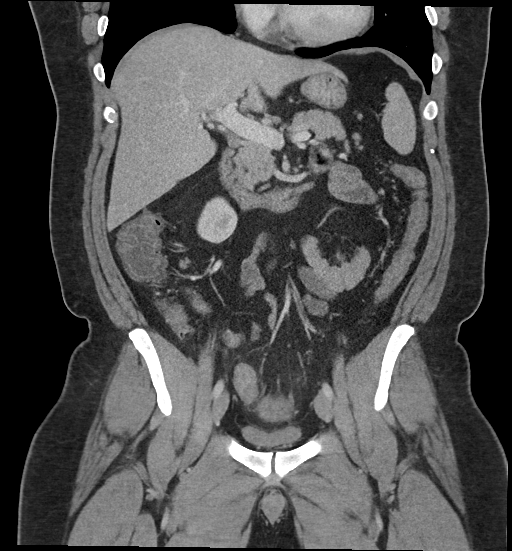

[16 of 46 positions shown; findings below may reference images not displayed]

RADIATION DOSE REDUCTION: This exam was performed according to the
departmental dose-optimization program which includes automated
exposure control, adjustment of the mA and/or kV according to
patient size and/or use of iterative reconstruction technique.

CONTRAST:  100mL OMNIPAQUE IOHEXOL 300 MG/ML  SOLN
FINDINGS: Lower chest: Unremarkable.

Hepatobiliary: No suspicious focal abnormality within the liver
parenchyma. There is no evidence for gallstones, gallbladder wall
thickening, or pericholecystic fluid. No intrahepatic or
extrahepatic biliary dilation.

Pancreas: No focal mass lesion. No dilatation of the main duct. No
intraparenchymal cyst. No peripancreatic edema.

Spleen: No splenomegaly. No focal mass lesion.

Adrenals/Urinary Tract: No adrenal nodule or mass. Kidneys
unremarkable. No evidence for hydroureter. The urinary bladder
appears normal for the degree of distention.

Stomach/Bowel: Stomach is unremarkable. No gastric wall thickening.
No evidence of outlet obstruction. Duodenum is normally positioned
as is the ligament of Treitz. No small bowel wall thickening. No
small bowel dilatation. The terminal ileum is normal. The appendix
is normal. Wall thickening with subtle pericolonic
edema/inflammation noted mid sigmoid colon through the rectum down
towards the anus.

Vascular/Lymphatic: No abdominal aortic aneurysm. No abdominal
aortic atherosclerotic calcification. There is no gastrohepatic or
hepatoduodenal ligament lymphadenopathy. No retroperitoneal or
mesenteric lymphadenopathy. No pelvic sidewall lymphadenopathy.

Reproductive: The prostate gland and seminal vesicles are
unremarkable.

Other: No intraperitoneal free fluid.

Musculoskeletal: No worrisome lytic or sclerotic osseous
abnormality.
IMPRESSION: Wall thickening with subtle pericolonic edema/inflammation noted mid
sigmoid colon through the rectum down towards the anus. Imaging
features are compatible with an infectious/inflammatory colitis.

## 2023-09-29 ENCOUNTER — Encounter: Payer: Self-pay | Admitting: Nurse Practitioner

## 2023-09-29 ENCOUNTER — Ambulatory Visit (INDEPENDENT_AMBULATORY_CARE_PROVIDER_SITE_OTHER): Payer: BC Managed Care – PPO | Admitting: Nurse Practitioner

## 2023-09-29 VITALS — BP 118/80 | HR 104 | Temp 97.4°F | Ht 68.0 in | Wt 314.0 lb

## 2023-09-29 DIAGNOSIS — L0291 Cutaneous abscess, unspecified: Secondary | ICD-10-CM | POA: Insufficient documentation

## 2023-09-29 MED ORDER — DOXYCYCLINE HYCLATE 100 MG PO TABS: 100.0000 mg | ORAL_TABLET | Freq: Two times a day (BID) | ORAL | 0 refills | Status: DC

## 2023-09-29 NOTE — Assessment & Plan Note (Signed)
 He has an approximately 3mm hole with clear to bloody discharge, experiencing no pain unless manipulated and no systemic symptoms. Previously, he had a nearby fluid-filled sac that a general surgeon lanced and drained.  CT scan did show that the fluid collection could have a fistula. We will start Doxycycline  100mg  twice a day with food, instruct him to wash the area with soap and water twice a day, and advise keeping the area exposed to air when possible, covering it with gauze and a bandage if going out. We will refer him back to the general surgeon for evaluation and ask him to contact the office if his condition worsens or he develops a fever.

## 2023-09-29 NOTE — Progress Notes (Signed)
 Acute Office Visit  Subjective:     Patient ID: Brian Alvarez, male    DOB: 07-Nov-1985, 37 y.o.   MRN: [id number]  Chief Complaint  Patient presents with   [facility] in right groin    No pain or swelling, just a hole with drainage    HPI Patient is in today for hole in skin with drainage.   Discussed the use of AI scribe software for clinical note transcription with the patient, who gave verbal consent to proceed.  History of Present Illness   The patient presents with a new skin lesion, described as a 'strange hole.' Initially, he noticed a sore spot, which he attributed to chafing. However, within a day or two, he noticed discharge from the area and upon inspection, discovered a hole. The hole continues to discharge, mostly clear fluid with occasional blood, but is not red or inflamed. The patient denies any associated fever.  Previously, the patient had a lump in a nearby location, which was initially suspected to be a hernia. However, upon referral to a general surgeon, it was found to be a fluid-filled sac. The sac was lanced and drained, and the area healed well with no recurrence of the lump. The patient wonders if the current lesion could be related to the previous lump, as it is in close proximity.      ROS See pertinent positives and negatives per HPI.     Objective:    BP 118/80 (BP Location: Left Arm, Patient Position: Sitting, Cuff Size: Large)   Pulse (!) 104   Temp (!) 97.4 F (36.3 C)   Ht 5' 8 (1.727 m)   Wt (!) 314 lb (142.4 kg)   SpO2 96%   BMI 47.74 kg/m    Physical Exam Vitals and nursing note reviewed.  Constitutional:      Appearance: Normal appearance.  HENT:     Head: Normocephalic.  Eyes:     Conjunctiva/sclera: Conjunctivae normal.  Pulmonary:     Effort: Pulmonary effort is normal.  Musculoskeletal:     Cervical back: Normal range of motion.  Skin:    General: Skin is warm.     Findings: Erythema present.     Comments: 2mm x 2mm x 3mm  wound to right groin, minimal clear/bloody drainage  Neurological:     General: No focal deficit present.     Mental Status: He is alert and oriented to person, place, and time.  Psychiatric:        Mood and Affect: Mood normal.        Behavior: Behavior normal.        Thought Content: Thought content normal.        Judgment: Judgment normal.       Assessment & Plan:   Problem List Items Addressed This Visit       Other   Abscess - Primary   He has an approximately 3mm hole with clear to bloody discharge, experiencing no pain unless manipulated and no systemic symptoms. Previously, he had a nearby fluid-filled sac that a general surgeon lanced and drained.  CT scan did show that the fluid collection could have a fistula. We will start Doxycycline  100mg  twice a day with food, instruct him to wash the area with soap and water twice a day, and advise keeping the area exposed to air when possible, covering it with gauze and a bandage if going out. We will refer him back to the general surgeon for evaluation and  ask him to contact the office if his condition worsens or he develops a fever.          Meds ordered this encounter  Medications   doxycycline  (VIBRA -TABS) 100 MG tablet    Sig: Take 1 tablet (100 mg total) by mouth 2 (two) times daily.    Dispense:  20 tablet    Refill:  0    Return if symptoms worsen or fail to improve.  Tinnie DELENA Harada, NP

## 2023-09-29 NOTE — Patient Instructions (Signed)
 It was great to see you!  Call the surgeon to schedule an appointment   Start doxycycline  twice a day with food  Keep the area clean, wash with soap and water twice a day.   Let's follow-up if symptoms worsen or any concerns   Take care,  Tinnie Harada, NP

## 2023-10-20 ENCOUNTER — Telehealth: Payer: Self-pay

## 2023-10-20 ENCOUNTER — Other Ambulatory Visit (HOSPITAL_COMMUNITY): Payer: Self-pay

## 2023-10-20 DIAGNOSIS — J4520 Mild intermittent asthma, uncomplicated: Secondary | ICD-10-CM

## 2023-10-20 NOTE — Telephone Encounter (Signed)
Pharmacy Patient Advocate Encounter   Received notification from Pt Calls Messages that prior authorization for Fluticasone HFA 44mg /ACT is required/requested.   Insurance verification completed.   The patient is insured through [facility] .   Per test claim: PA required; PA submitted to above mentioned insurance via CoverMyMeds Key/confirmation #/EOC [id number] Status is pending

## 2023-10-20 NOTE — Telephone Encounter (Signed)
Can we get a prior auth on Fluticasone HFA ?  Patient ID [id number]

## 2023-10-24 NOTE — Telephone Encounter (Signed)
Copied from CRM [id number]. Topic: Clinical - Prescription Issue >> Oct 21, 2023  4:14 PM Irine Seal wrote: Reason for CRM: Burna Mortimer- [employer] calling to advised the prior authorization for fluticasone Fluticasone HFA 44mg /ACT has been denied.  The denial notice will be faxed to the office shortly, and it does come with a list of alternative medications that are covered through insurance

## 2023-10-25 NOTE — Telephone Encounter (Signed)
 Pharmacy Patient Advocate Encounter  Received notification from [facility] that Prior Authorization for Fluticasone  Propionate HFA 44MCG/ACT aerosol has been DENIED.  Full denial letter will be uploaded to the media tab. See denial reason below.   PA #/Case ID/Reference #: [id number]

## 2023-10-26 NOTE — Telephone Encounter (Signed)
 LVM for patient to return call.

## 2023-10-27 NOTE — Telephone Encounter (Signed)
 LVM for patient to return call.

## 2023-10-28 MED ORDER — ARNUITY ELLIPTA 50 MCG/ACT IN AEPB: 1.0000 | INHALATION_SPRAY | Freq: Every day | RESPIRATORY_TRACT | 3 refills | Status: AC

## 2023-10-28 NOTE — Telephone Encounter (Signed)
 Medication sent to pharmacy

## 2023-10-28 NOTE — Telephone Encounter (Signed)
 I called patient and notified him that medication sent to pharmacy.

## 2023-10-28 NOTE — Telephone Encounter (Signed)
 I called and spoke with patient and he has not tried either medication before but looked up both and prefers to try Arnuity Elipta.

## 2023-11-07 ENCOUNTER — Encounter: Payer: Self-pay | Admitting: Nurse Practitioner

## 2023-11-07 ENCOUNTER — Ambulatory Visit: Payer: BC Managed Care – PPO | Admitting: Family Medicine

## 2023-11-07 ENCOUNTER — Telehealth (INDEPENDENT_AMBULATORY_CARE_PROVIDER_SITE_OTHER): Payer: BC Managed Care – PPO | Admitting: Nurse Practitioner

## 2023-11-07 VITALS — BP 134/82 | HR 88 | Ht 68.0 in | Wt 210.0 lb

## 2023-11-07 DIAGNOSIS — F32A Depression, unspecified: Secondary | ICD-10-CM

## 2023-11-07 DIAGNOSIS — N529 Male erectile dysfunction, unspecified: Secondary | ICD-10-CM

## 2023-11-07 DIAGNOSIS — F419 Anxiety disorder, unspecified: Secondary | ICD-10-CM | POA: Diagnosis not present

## 2023-11-07 MED ORDER — SILDENAFIL CITRATE 100 MG PO TABS: 50.0000 mg | ORAL_TABLET | Freq: Every day | ORAL | 11 refills | Status: AC | PRN

## 2023-11-07 MED ORDER — DULOXETINE HCL 30 MG PO CPEP: 30.0000 mg | ORAL_CAPSULE | Freq: Every day | ORAL | 1 refills | Status: DC

## 2023-11-07 NOTE — Progress Notes (Unsigned)
[facility] [address] [place] [address] Dept: [phone] Dept Fax: [phone]  Virtual Video Visit  I connected with Brian Alvarez on 11/08/23 at  4:00 PM EST by a video enabled telemedicine application and verified that I am speaking with the correct person using two identifiers.  Location patient: Home Location provider: Clinic Persons participating in the virtual visit: Patient; Rodman Pickle, NP; Malena Peer, CMA  I discussed the limitations of evaluation and management by telemedicine and the availability of in person appointments. The patient expressed understanding and agreed to proceed.  Chief Complaint  Patient presents with   Depression    Wants to discuss medication for depression and possible medication for erectile dysfunction    SUBJECTIVE:  HPI: Brian Alvarez is a 38 y.o. male who presents with depression.   He has been having worsening depression and anxiety over the last several months. He feels like he is catastrophizing situations that are not or going down a rabbit hole. He has been having trouble sleeping, however that has been chronic. He denies crying spells and SI/HI. He has tried therapy, zoloft, and buspar in the past. These did not work or he had side effects of feeling foggy, slow, and forgetting things at work. He is interested in trying a SNRI and doing genetic testing to see which medication would be best for him. He also notes erectile dysfunction with the depression and has responded well to viagra in the past.      11/07/2023    3:51 PM 03/07/2023    2:05 PM 12/03/2022    1:19 PM 09/03/2022    9:59 AM 09/03/2022    9:22 AM  Depression screen PHQ 2/9  Decreased Interest 2 0 0 0 0  Down, Depressed, Hopeless 3 1 0 2 0  PHQ - 2 Score 5 1 0 2 0  Altered sleeping 3 3 3 3    Tired, decreased energy 3 3 3 3    Change in appetite 2 1 0 2   Feeling bad or failure about yourself  2 1  1 1    Trouble concentrating 3 3 0 0   Moving slowly or fidgety/restless 0 0 0 0   Suicidal thoughts 0 0 0 0   PHQ-9 Score 18 12 7 11    Difficult doing work/chores Somewhat difficult Somewhat difficult Very difficult Somewhat difficult       11/07/2023    3:53 PM 03/07/2023    2:05 PM 12/03/2022    1:21 PM 09/03/2022   10:00 AM  GAD 7 : Generalized Anxiety Score  Nervous, Anxious, on Edge 3 3 2 3   Control/stop worrying 3 2 0 3  Worry too much - different things 3 1 1  0  Trouble relaxing 1 2 1 1   Restless 0 0 0 0  Easily annoyed or irritable 1 1 1 1   Afraid - awful might happen 1 2 1  0  Total GAD 7 Score 12 11 6 8   Anxiety Difficulty Somewhat difficult Very difficult Somewhat difficult Somewhat difficult    Patient Active Problem List   Diagnosis Date Noted   Erectile dysfunction 11/08/2023   Anxiety and depression 11/08/2023   Abscess 09/29/2023   Chronic fatigue 12/03/2022   Primary hypertension 09/03/2022   Acute maxillary sinusitis 07/09/2022   Controlled substance agreement signed 06/07/2022   Chronic right-sided low back pain with right-sided sciatica 06/07/2022   Parasomnia, unspecified 10/19/2017   Reactive airway disease 10/19/2017   Confusional  arousals 03/28/2017   PLMD (periodic limb movement disorder) 11/24/2016   Morbid obesity with body mass index of 45.0-49.9 in adult [facility]) 11/24/2016   Hypersomnia with sleep apnea 11/24/2016   Snoring 11/24/2016   Attention deficit hyperactivity disorder (ADHD) 04/29/2016   Seasonal allergies 09/12/2012   Asthma 12/26/2011   Carpal tunnel syndrome 11/11/2011    Past Surgical History:  Procedure Laterality Date   LUMBAR DISC SURGERY      Family History  Problem Relation Age of Onset   Drug abuse Mother    Heart failure Mother    Hypertension Mother    Arthritis Maternal Grandmother    Heart disease Maternal Grandfather    Colon cancer Neg Hx    Stomach cancer Neg Hx    Esophageal cancer Neg Hx     Social  History   Tobacco Use   Smoking status: Former    Current packs/day: 0.00    Types: Cigarettes    Quit date: 2014    Years since quitting: 11.1   Smokeless tobacco: Never   Tobacco comments:    has switched to electronic cigarette  Vaping Use   Vaping status: Some Days  Substance Use Topics   Alcohol use: Yes    Comment: occassional   Drug use: No     Current Outpatient Medications:    albuterol (PROVENTIL) (2.5 MG/3ML) 0.083% nebulizer solution, Take 3 mLs (2.5 mg total) by nebulization every 6 (six) hours as needed for wheezing or shortness of breath., Disp: 150 mL, Rfl: 1   albuterol (VENTOLIN HFA) 108 (90 Base) MCG/ACT inhaler, INHALE 2 PUFFS INTO THE LUNGS EVERY 4 HOURS AS NEEDED FOR WHEEZING OR SHORTNESS OF BREATH, Disp: 18 g, Rfl: 6   amphetamine-dextroamphetamine (ADDERALL) 10 MG tablet, Take 1 tablet (10 mg total) by mouth 2 (two) times daily., Disp: 60 tablet, Rfl: 0   cholecalciferol (VITAMIN D3) 25 MCG (1000 UNIT) tablet, Take 2,000 Units by mouth daily., Disp: , Rfl:    DULoxetine (CYMBALTA) 30 MG capsule, Take 1 capsule (30 mg total) by mouth daily., Disp: 30 capsule, Rfl: 1   fluticasone (FLONASE) 50 MCG/ACT nasal spray, Place 2 sprays into both nostrils daily., Disp: 16 g, Rfl: 0   Fluticasone Furoate (ARNUITY ELLIPTA) 50 MCG/ACT AEPB, Inhale 1 puff into the lungs daily at 6 (six) AM. Rinse mouth after using, Disp: 30 each, Rfl: 3   ibuprofen (ADVIL) 600 MG tablet, Take 1 tablet (600 mg total) by mouth every 6 (six) hours as needed., Disp: 30 tablet, Rfl: 0   Multiple Vitamins-Minerals (MULTIVITAMIN WITH MINERALS) tablet, Take 1 tablet by mouth daily., Disp: , Rfl:    olmesartan-hydrochlorothiazide (BENICAR HCT) 20-12.5 MG tablet, Take 1 tablet by mouth daily., Disp: 90 tablet, Rfl: 1   sildenafil (VIAGRA) 100 MG tablet, Take 0.5-1 tablets (50-100 mg total) by mouth daily as needed for erectile dysfunction., Disp: 5 tablet, Rfl: 11   VITAMIN D PO, Take by mouth., Disp:  , Rfl:    EPINEPHrine (EPIPEN 2-PAK) 0.3 mg/0.3 mL IJ SOAJ injection, Inject 0.3 mg into the muscle as needed for anaphylaxis. (Patient not taking: Reported on 11/07/2023), Disp: 2 each, Rfl: 0   traZODone (DESYREL) 50 MG tablet, Take 50 mg by mouth at bedtime. (Patient not taking: Reported on 11/07/2023), Disp: , Rfl:   Allergies  Allergen Reactions   Penicillins     Hives Has patient had a PCN reaction causing immediate rash, facial/tongue/throat swelling, SOB or lightheadedness with hypotension: yes Has patient had  a PCN reaction causing severe rash involving mucus membranes or skin necrosis: yes Has patient had a PCN reaction that required hospitalization: no Has patient had a PCN reaction occurring within the last 10 years: yes If all of the above answers are "NO", then may proceed with Cephalosporin use.   Rosemary Oil     ROS: See pertinent positives and negatives per HPI.  OBSERVATIONS/OBJECTIVE:  VITALS per patient if applicable: Today's Vitals   11/07/23 1555  BP: 134/82  Pulse: 88  Weight: 210 lb (95.3 kg)  Height: 5\' 8"  (1.727 m)   Body mass index is 31.93 kg/m.    GENERAL: Alert and oriented. Appears well and in no acute distress.  HEENT: Atraumatic. Conjunctiva clear. No obvious abnormalities on inspection of external nose and ears.  NECK: Normal movements of the head and neck.  LUNGS: On inspection, no signs of respiratory distress. Breathing rate appears normal. No obvious gross SOB, gasping or wheezing, and no conversational dyspnea.  CV: No obvious cyanosis.  MS: Moves all visible extremities without noticeable abnormality.  PSYCH/NEURO: Pleasant and cooperative. No obvious depression or anxiety. Speech and thought processing grossly intact.  ASSESSMENT AND PLAN:  Problem List Items Addressed This Visit       Other   Erectile dysfunction   Chronic, intermittent. This is worsened with his worsening depression. Will start viagra 50-100mg  daily as  needed. This has worked well for him in the past.       Anxiety and depression - Primary   Chronic, not controlled. His depression and anxiety have worsened over the last month. He has tried therapy, zoloft, and buspar in the past, however they did not work or had side effects. He denies SI/HI. His PHQ-9 is a 18 and his GAD 7 is a 12. Will start duloxetine 30mg  daily. Discussed possible side effects. He is also interested in genetic testing for his DNA response to psychiatric medications. Order placed and will have him come in for a lab visit. Follow-up in 4-6 weeks.       Relevant Medications   DULoxetine (CYMBALTA) 30 MG capsule   Other Relevant Orders   [facility]     I discussed the assessment and treatment plan with the patient. The patient was provided an opportunity to ask questions and all were answered. The patient agreed with the plan and demonstrated an understanding of the instructions.   The patient was advised to call back or seek an in-person evaluation if the symptoms worsen or if the condition fails to improve as anticipated.   Gerre Scull, NP

## 2023-11-08 ENCOUNTER — Encounter: Payer: Self-pay | Admitting: Nurse Practitioner

## 2023-11-08 ENCOUNTER — Other Ambulatory Visit: Payer: BC Managed Care – PPO

## 2023-11-08 DIAGNOSIS — N529 Male erectile dysfunction, unspecified: Secondary | ICD-10-CM | POA: Insufficient documentation

## 2023-11-08 DIAGNOSIS — F32A Depression, unspecified: Secondary | ICD-10-CM | POA: Insufficient documentation

## 2023-11-08 NOTE — Assessment & Plan Note (Signed)
Chronic, intermittent. This is worsened with his worsening depression. Will start viagra 50-100mg  daily as needed. This has worked well for him in the past.

## 2023-11-08 NOTE — Assessment & Plan Note (Signed)
Chronic, not controlled. His depression and anxiety have worsened over the last month. He has tried therapy, zoloft, and buspar in the past, however they did not work or had side effects. He denies SI/HI. His PHQ-9 is a 18 and his GAD 7 is a 12. Will start duloxetine 30mg  daily. Discussed possible side effects. He is also interested in genetic testing for his DNA response to psychiatric medications. Order placed and will have him come in for a lab visit. Follow-up in 4-6 weeks.

## 2023-11-08 NOTE — Patient Instructions (Signed)
It was great to see you!  Start duloxetine 1 capsule daily (with or without food)  Start viagra 1/2-1 tablet daily as needed  I have ordered the genetic testing for you. Our office will reach out to schedule a lab visit (it is a blood draw).   Let's follow-up in 4-6 weeks.   Take care,  Rodman Pickle, NP

## 2023-11-10 ENCOUNTER — Encounter: Payer: Self-pay | Admitting: Nurse Practitioner

## 2023-11-11 ENCOUNTER — Other Ambulatory Visit: Payer: BC Managed Care – PPO

## 2024-01-05 ENCOUNTER — Other Ambulatory Visit: Payer: Self-pay | Admitting: Nurse Practitioner

## 2024-01-05 NOTE — Telephone Encounter (Signed)
 Requesting: DULOXETINE DR 30MG  CAPSULES  Last Visit: 09/29/2023 Next Visit: Visit date not found Last Refill: 11/07/2023  Please Advise

## 2024-02-06 ENCOUNTER — Other Ambulatory Visit: Payer: Self-pay | Admitting: Nurse Practitioner

## 2024-02-06 DIAGNOSIS — J4520 Mild intermittent asthma, uncomplicated: Secondary | ICD-10-CM

## 2024-03-04 ENCOUNTER — Other Ambulatory Visit: Payer: Self-pay | Admitting: Nurse Practitioner

## 2024-03-06 NOTE — Telephone Encounter (Signed)
 Requesting: DULOXETINE  DR 30MG  CAPSULES  Last Visit: 09/29/2023 Next Visit: Visit date not found Last Refill: 01/05/2024 Per last OV note to follow up in 4-6 weeks.   Please Advise

## 2024-03-19 ENCOUNTER — Other Ambulatory Visit: Payer: Self-pay | Admitting: Nurse Practitioner

## 2024-04-01 ENCOUNTER — Other Ambulatory Visit: Payer: Self-pay | Admitting: Nurse Practitioner

## 2024-04-17 ENCOUNTER — Other Ambulatory Visit: Payer: Self-pay | Admitting: Nurse Practitioner

## 2024-04-17 NOTE — Telephone Encounter (Signed)
 Requesting: DULOXETINE  DR 30MG  CAPSULES  Last Visit: 09/29/2023 Next Visit: Visit date not found Last Refill: 03/07/2024  Please Advise Patient needs an appointment

## 2024-04-19 ENCOUNTER — Ambulatory Visit (INDEPENDENT_AMBULATORY_CARE_PROVIDER_SITE_OTHER): Admitting: Nurse Practitioner

## 2024-04-19 ENCOUNTER — Encounter: Payer: Self-pay | Admitting: Nurse Practitioner

## 2024-04-19 VITALS — BP 138/98 | HR 89 | Temp 97.5°F | Ht 68.0 in | Wt 324.2 lb

## 2024-04-19 DIAGNOSIS — I10 Essential (primary) hypertension: Secondary | ICD-10-CM | POA: Diagnosis not present

## 2024-04-19 DIAGNOSIS — R7303 Prediabetes: Secondary | ICD-10-CM

## 2024-04-19 DIAGNOSIS — E559 Vitamin D deficiency, unspecified: Secondary | ICD-10-CM | POA: Diagnosis not present

## 2024-04-19 DIAGNOSIS — F32A Depression, unspecified: Secondary | ICD-10-CM

## 2024-04-19 DIAGNOSIS — F419 Anxiety disorder, unspecified: Secondary | ICD-10-CM

## 2024-04-19 LAB — CBC WITH DIFFERENTIAL/PLATELET
Basophils Absolute: 0.1 K/uL (ref 0.0–0.1)
Basophils Relative: 0.9 % (ref 0.0–3.0)
Eosinophils Absolute: 0.1 K/uL (ref 0.0–0.7)
Eosinophils Relative: 1.6 % (ref 0.0–5.0)
HCT: 43.2 % (ref 39.0–52.0)
Hemoglobin: 14.3 g/dL (ref 13.0–17.0)
Lymphocytes Relative: 25.6 % (ref 12.0–46.0)
Lymphs Abs: 1.5 K/uL (ref 0.7–4.0)
MCHC: 33 g/dL (ref 30.0–36.0)
MCV: 84.2 fl (ref 78.0–100.0)
Monocytes Absolute: 0.6 K/uL (ref 0.1–1.0)
Monocytes Relative: 10.1 % (ref 3.0–12.0)
Neutro Abs: 3.5 K/uL (ref 1.4–7.7)
Neutrophils Relative %: 61.8 % (ref 43.0–77.0)
Platelets: 214 K/uL (ref 150.0–400.0)
RBC: 5.13 Mil/uL (ref 4.22–5.81)
RDW: 13.4 % (ref 11.5–15.5)
WBC: 5.7 K/uL (ref 4.0–10.5)

## 2024-04-19 LAB — COMPREHENSIVE METABOLIC PANEL WITH GFR
ALT: 21 U/L (ref 0–53)
AST: 19 U/L (ref 0–37)
Albumin: 4.2 g/dL (ref 3.5–5.2)
Alkaline Phosphatase: 68 U/L (ref 39–117)
BUN: 13 mg/dL (ref 6–23)
CO2: 28 meq/L (ref 19–32)
Calcium: 8.8 mg/dL (ref 8.4–10.5)
Chloride: 103 meq/L (ref 96–112)
Creatinine, Ser: 0.82 mg/dL (ref 0.40–1.50)
GFR: 110.36 mL/min (ref >60.00)
Glucose, Bld: 94 mg/dL (ref 70–99)
Potassium: 3.7 meq/L (ref 3.5–5.1)
Sodium: 138 meq/L (ref 135–145)
Total Bilirubin: 0.4 mg/dL (ref 0.2–1.2)
Total Protein: 7 g/dL (ref 6.0–8.3)

## 2024-04-19 LAB — VITAMIN D 25 HYDROXY (VIT D DEFICIENCY, FRACTURES): VITD: 38.66 ng/mL (ref 30.00–100.00)

## 2024-04-19 LAB — LIPID PANEL
Cholesterol: 159 mg/dL (ref 0–200)
HDL: 34.4 mg/dL — ABNORMAL LOW (ref >39.00)
LDL Cholesterol: 98 mg/dL (ref 0–99)
NonHDL: 124.18
Total CHOL/HDL Ratio: 5
Triglycerides: 132 mg/dL (ref 0.0–149.0)
VLDL: 26.4 mg/dL (ref 0.0–40.0)

## 2024-04-19 LAB — HEMOGLOBIN A1C: Hgb A1c MFr Bld: 6.2 % (ref 4.6–6.5)

## 2024-04-19 MED ORDER — VENLAFAXINE HCL ER 37.5 MG PO CP24: 37.5000 mg | ORAL_CAPSULE | Freq: Every day | ORAL | 1 refills | Status: DC

## 2024-04-19 NOTE — Assessment & Plan Note (Signed)
 Chronic, not controlled.  Blood pressure is slightly elevated today, however it could be from being out of duloxetine  for a few days and experiencing some withdrawal symptoms.  Encouraged him to check his blood pressure at home.  Check CMP, CBC, lipid panel today.  Follow-up in 4 to 6 weeks.

## 2024-04-19 NOTE — Patient Instructions (Signed)
 It was great to see you!  Stop duloxetine  and start venlafaxine  one capsule daily   Start checking your blood pressure at home  We are checking your labs today and will let you know the results via mychart/phone.   Let's follow-up in 4-6 weeks, sooner if you have concerns.  If a referral was placed today, you will be contacted for an appointment. Please note that routine referrals can sometimes take up to 3-4 weeks to process. Please call our office if you haven't heard anything after this time frame.  Take care,  Tinnie Harada, NP

## 2024-04-19 NOTE — Progress Notes (Signed)
 Established Patient Office Visit  Subjective   Patient ID: Brian Alvarez, male    DOB: Apr 15, 1986  Age: 38 y.o. MRN: [id number]  Chief Complaint  Patient presents with   Anxiety and depression    Follow up, wants to discuss changing medication    HPI  Brian Alvarez is here to follow-up on depression and anxiety.  F English is really helping with his mood denies depression and anxiety, however they cause him feel a lack of motivation.  He would procrastinate on things that were not necessary to be done.  If that did have to be done right away, he would get this done however.  He is interested in trying a different medication to see if this would happen with any other ones.  He has not been checking his blood pressure at home.  He notes that his blood pressure have been really well-controlled for several months that he was even having some low blood pressures, so he stopped his olmesartan -hydrochlorothiazide.  He has not been checking his blood pressure recently.  He denies chest pain or shortness of breath.     04/19/2024    1:36 PM 11/07/2023    3:51 PM 03/07/2023    2:05 PM 12/03/2022    1:19 PM 09/03/2022    9:59 AM  Depression screen PHQ 2/9  Decreased Interest 0 2 0 0 0  Down, Depressed, Hopeless 1 3 1  0 2  PHQ - 2 Score 1 5 1  0 2  Altered sleeping 3 3 3 3 3   Tired, decreased energy 2 3 3 3 3   Change in appetite 1 2 1  0 2  Feeling bad or failure about yourself  1 2 1 1 1   Trouble concentrating 0 3 3 0 0  Moving slowly or fidgety/restless 0 0 0 0 0  Suicidal thoughts 0 0 0 0 0  PHQ-9 Score 8 18 12 7 11   Difficult doing work/chores Very difficult Somewhat difficult Somewhat difficult Very difficult Somewhat difficult      04/19/2024    1:36 PM 11/07/2023    3:53 PM 03/07/2023    2:05 PM 12/03/2022    1:21 PM  GAD 7 : Generalized Anxiety Score  Nervous, Anxious, on Edge 3 3 3 2   Control/stop worrying 1 3 2  0  Worry too much - different things 1 3 1 1   Trouble relaxing 1 1 2  1   Restless 0 0 0 0  Easily annoyed or irritable 0 1 1 1   Afraid - awful might happen 2 1 2 1   Total GAD 7 Score 8 12 11 6   Anxiety Difficulty Somewhat difficult Somewhat difficult Very difficult Somewhat difficult     ROS See pertinent positives and negatives per HPI.    Objective:     BP (!) 138/98 (BP Location: Right Arm, Cuff Size: Large)   Pulse 89   Temp (!) 97.5 F (36.4 C)   Ht 5' 8 (1.727 m)   Wt (!) 324 lb 3.2 oz (147.1 kg)   SpO2 96%   BMI 49.29 kg/m    Physical Exam Vitals and nursing note reviewed.  Constitutional:      Appearance: Normal appearance.  HENT:     Head: Normocephalic.  Eyes:     Conjunctiva/sclera: Conjunctivae normal.  Pulmonary:     Effort: Pulmonary effort is normal.  Musculoskeletal:     Cervical back: Normal range of motion.  Skin:    General: Skin is warm.  Neurological:  General: No focal deficit present.     Mental Status: He is alert and oriented to person, place, and time.  Psychiatric:        Mood and Affect: Mood normal.        Behavior: Behavior normal.        Thought Content: Thought content normal.        Judgment: Judgment normal.      Assessment & Plan:   Problem List Items Addressed This Visit       Cardiovascular and Mediastinum   Primary hypertension - Primary   Chronic, not controlled.  Blood pressure is slightly elevated today, however it could be from being out of duloxetine  for a few days and experiencing some withdrawal symptoms.  Encouraged him to check his blood pressure at home.  Check CMP, CBC, lipid panel today.  Follow-up in 4 to 6 weeks.      Relevant Orders   CBC with Differential/Platelet   Lipid panel   Comprehensive metabolic panel with GFR     Other   Anxiety and depression   Chronic, ongoing.  The duloxetine  did help with his depression anxiety, however it did cause him to have some lack of motivation.  He is interested in trialing a different medication.  Sertraline  caused side  effects.  Will have him start venlafaxine  37.5 mg daily.  Encouraged him to reach out with any side effects.  Follow-up in 4 to 6 weeks.      Relevant Medications   venlafaxine  XR (EFFEXOR  XR) 37.5 MG 24 hr capsule   Vitamin D  insufficiency   He has had low vitamin D  in the past.  Will check vitamin D  today and treat based on results.      Relevant Orders   VITAMIN D  25 Hydroxy (Vit-D Deficiency, Fractures)   Prediabetes   Chronic, stable.  Check A1c and treat based on results.      Relevant Orders   Hemoglobin A1c    Return in about 4 weeks (around 05/17/2024) for 4-6 weeks , Anxiety, Depression, may be virtual .    Tinnie DELENA Harada, NP

## 2024-04-19 NOTE — Assessment & Plan Note (Signed)
 Chronic, stable. Check A1c and treat based on results.

## 2024-04-19 NOTE — Assessment & Plan Note (Signed)
 Chronic, ongoing.  The duloxetine  did help with his depression anxiety, however it did cause him to have some lack of motivation.  He is interested in trialing a different medication.  Sertraline  caused side effects.  Will have him start venlafaxine  37.5 mg daily.  Encouraged him to reach out with any side effects.  Follow-up in 4 to 6 weeks.

## 2024-04-19 NOTE — Assessment & Plan Note (Signed)
 He has had low vitamin D  in the past.  Will check vitamin D  today and treat based on results.

## 2024-04-23 ENCOUNTER — Ambulatory Visit: Payer: Self-pay | Admitting: Nurse Practitioner

## 2024-04-30 ENCOUNTER — Encounter: Payer: Self-pay | Admitting: Emergency Medicine

## 2024-04-30 ENCOUNTER — Ambulatory Visit: Admission: EM | Admit: 2024-04-30 | Discharge: 2024-04-30 | Disposition: A | Discharge status: Home / Self Care

## 2024-04-30 DIAGNOSIS — B349 Viral infection, unspecified: Secondary | ICD-10-CM

## 2024-04-30 LAB — POCT RAPID STREP A (OFFICE): Rapid Strep A Screen: NEGATIVE

## 2024-04-30 LAB — POC COVID19/FLU A&B COMBO
Covid Antigen, POC: NEGATIVE
Influenza A Antigen, POC: NEGATIVE
Influenza B Antigen, POC: NEGATIVE

## 2024-04-30 MED ORDER — PREDNISONE 20 MG PO TABS: 40.0000 mg | ORAL_TABLET | Freq: Every day | ORAL | 0 refills | Status: AC

## 2024-04-30 MED ORDER — AZELASTINE HCL 0.1 % NA SOLN: 1.0000 | Freq: Two times a day (BID) | NASAL | 1 refills | Status: AC

## 2024-04-30 MED ORDER — ACETAMINOPHEN 325 MG PO TABS
975.0000 mg | ORAL_TABLET | Freq: Once | ORAL | Status: AC
  Administered 2024-04-30 (×2): 975 mg via ORAL

## 2024-04-30 MED ORDER — PROMETHAZINE-DM 6.25-15 MG/5ML PO SYRP: 5.0000 mL | ORAL_SOLUTION | Freq: Four times a day (QID) | ORAL | 0 refills | Status: DC | PRN

## 2024-04-30 NOTE — ED Provider Notes (Signed)
 [facility]  Note:  This document was prepared using Dragon voice recognition software and may include unintentional dictation errors.  MRN: [id number] DOB: 12-17-1985  Subjective:   Brian Alvarez is a 38 y.o. male presenting for acute fever, body aches, headache, sore throat, cough since last night.  Patient has 101.1 fever today in urgent care states he had 101.4 earlier today at work.  Patient has not taken any over-the-counter medication to treat symptoms.  Patient denies any known sick contacts.  No current shortness of breath, chest pain, weakness, dizziness.  No current facility-administered medications for this encounter.  Current Outpatient Medications:    azelastine  (ASTELIN ) 0.1 % nasal spray, Place 1 spray into both nostrils 2 (two) times daily. Use in each nostril as directed, Disp: 30 mL, Rfl: 1   predniSONE  (DELTASONE ) 20 MG tablet, Take 2 tablets (40 mg total) by mouth daily for 5 days., Disp: 10 tablet, Rfl: 0   promethazine -dextromethorphan (PROMETHAZINE -DM) 6.25-15 MG/5ML syrup, Take 5 mLs by mouth 4 (four) times daily as needed for cough., Disp: 118 mL, Rfl: 0   albuterol  (PROVENTIL ) (2.5 MG/3ML) 0.083% nebulizer solution, Take 3 mLs (2.5 mg total) by nebulization every 6 (six) hours as needed for wheezing or shortness of breath., Disp: 150 mL, Rfl: 1   amphetamine -dextroamphetamine  (ADDERALL) 10 MG tablet, Take 1 tablet (10 mg total) by mouth 2 (two) times daily., Disp: 60 tablet, Rfl: 0   cholecalciferol (VITAMIN D3) 25 MCG (1000 UNIT) tablet, Take 2,000 Units by mouth daily., Disp: , Rfl:    EPINEPHrine  (EPIPEN  2-PAK) 0.3 mg/0.3 mL IJ SOAJ injection, Inject 0.3 mg into the muscle as needed for anaphylaxis., Disp: 2 each, Rfl: 0   fluticasone  (FLONASE ) 50 MCG/ACT nasal spray, Place 2 sprays into both nostrils daily., Disp: 16 g, Rfl: 0   Fluticasone  Furoate (ARNUITY ELLIPTA ) 50 MCG/ACT AEPB, Inhale 1 puff into the lungs daily at 6 (six) AM. Rinse mouth  after using, Disp: 30 each, Rfl: 3   ibuprofen  (ADVIL ) 600 MG tablet, Take 1 tablet (600 mg total) by mouth every 6 (six) hours as needed., Disp: 30 tablet, Rfl: 0   Multiple Vitamins-Minerals (MULTIVITAMIN WITH MINERALS) tablet, Take 1 tablet by mouth daily., Disp: , Rfl:    olmesartan -hydrochlorothiazide (BENICAR  HCT) 20-12.5 MG tablet, TAKE 1 TABLET BY MOUTH DAILY, Disp: 90 tablet, Rfl: 2   sildenafil  (VIAGRA ) 100 MG tablet, Take 0.5-1 tablets (50-100 mg total) by mouth daily as needed for erectile dysfunction., Disp: 5 tablet, Rfl: 11   traZODone (DESYREL) 50 MG tablet, Take 50 mg by mouth at bedtime., Disp: , Rfl:    venlafaxine  XR (EFFEXOR  XR) 37.5 MG 24 hr capsule, Take 1 capsule (37.5 mg total) by mouth daily with breakfast., Disp: 30 capsule, Rfl: 1   VENTOLIN  HFA 108 (90 Base) MCG/ACT inhaler, INHALE 2 PUFFS INTO THE LUNGS EVERY 4 HOURS AS NEEDED FOR WHEEZING OR SHORTNESS OF BREATH, Disp: 18 g, Rfl: 6   VITAMIN D  PO, Take by mouth., Disp: , Rfl:    Allergies  Allergen Reactions   Penicillins     Hives Has patient had a PCN reaction causing immediate rash, facial/tongue/throat swelling, SOB or lightheadedness with hypotension: yes Has patient had a PCN reaction causing severe rash involving mucus membranes or skin necrosis: yes Has patient had a PCN reaction that required hospitalization: no Has patient had a PCN reaction occurring within the last 10 years: yes If all of the above answers are NO, then may proceed with  Cephalosporin use.   Rosemary Oil     Past Medical History:  Diagnosis Date   ADHD    Allergy    Anemia    Anxiety    Asthma    Carpal tunnel syndrome    Depression    HTN (hypertension)    Insomnia    Lumbar herniated disc    Obesity      Past Surgical History:  Procedure Laterality Date   LUMBAR DISC SURGERY      Family History  Problem Relation Age of Onset   Drug abuse Mother    Heart failure Mother    Hypertension Mother    Arthritis  Maternal Grandmother    Heart disease Maternal Grandfather    Colon cancer Neg Hx    Stomach cancer Neg Hx    Esophageal cancer Neg Hx     Social History   Tobacco Use   Smoking status: Former    Current packs/day: 0.00    Types: Cigarettes    Quit date: 2014    Years since quitting: 11.6   Smokeless tobacco: Never   Tobacco comments:    has switched to electronic cigarette  Vaping Use   Vaping status: Some Days  Substance Use Topics   Alcohol use: Yes    Comment: occassional   Drug use: No    ROS Refer to HPI for ROS details.  Objective:    Vitals: BP (!) 145/73 (BP Location: Right Arm)   Pulse (!) 111   Temp (!) 101.1 F (38.4 C) (Oral)   Resp 20   SpO2 95%   Physical Exam Vitals and nursing note reviewed.  Constitutional:      General: He is not in acute distress.    Appearance: Normal appearance. He is well-developed. He is not ill-appearing or toxic-appearing.  HENT:     Head: Normocephalic.     Nose: Congestion present. No rhinorrhea.     Mouth/Throat:     Mouth: Mucous membranes are moist.     Pharynx: Oropharynx is clear.  Eyes:     General:        Right eye: No discharge.        Left eye: No discharge.     Extraocular Movements: Extraocular movements intact.     Conjunctiva/sclera: Conjunctivae normal.  Cardiovascular:     Rate and Rhythm: Regular rhythm. Tachycardia present.     Heart sounds: Normal heart sounds. No murmur heard. Pulmonary:     Effort: Pulmonary effort is normal. No respiratory distress.     Breath sounds: Normal breath sounds. No stridor. No wheezing, rhonchi or rales.  Chest:     Chest wall: No tenderness.  Musculoskeletal:     Cervical back: Normal range of motion. No rigidity or tenderness.  Skin:    General: Skin is warm and dry.  Neurological:     General: No focal deficit present.     Mental Status: He is alert and oriented to person, place, and time.  Psychiatric:        Mood and Affect: Mood normal.         Behavior: Behavior normal.     Procedures  Results for orders placed or performed during the hospital encounter of 04/30/24 (from the past 24 hours)  POC Covid19/Flu A&B Antigen     Status: Normal   Collection Time: 04/30/24  2:57 PM  Result Value Ref Range   Influenza A Antigen, POC Negative Negative   Influenza B Antigen, POC  Negative Negative   Covid Antigen, POC Negative Negative  POCT rapid strep A     Status: Normal   Collection Time: 04/30/24  2:57 PM  Result Value Ref Range   Rapid Strep A Screen Negative Negative    Assessment and Plan :     Discharge Instructions       1. Acute viral syndrome (Primary) - POC Covid19/Flu A&B Antigen performed in UC is negative for COVID and influenza.  Antigen testing is most accurate after symptoms have been present for 48 to 72 hours.  Retesting for COVID after 2 to 3 days of symptoms is recommended. - POCT rapid strep A performed in UC is negative for strep pharyngitis - acetaminophen  (TYLENOL ) tablet 975 mg given in UC for acute fever - azelastine  (ASTELIN ) 0.1 % nasal spray; Place 1 spray into both nostrils 2 (two) times daily. Use in each nostril as directed  Dispense: 30 mL; Refill: 1 - promethazine -dextromethorphan (PROMETHAZINE -DM) 6.25-15 MG/5ML syrup; Take 5 mLs by mouth 4 (four) times daily as needed for cough.  Dispense: 118 mL; Refill: 0 - predniSONE  (DELTASONE ) 20 MG tablet; Take 2 tablets (40 mg total) by mouth daily for 5 days.  Dispense: 10 tablet; Refill: 0 -Continue to monitor symptoms for any change in severity if there is any escalation of current symptoms or development of new symptoms follow-up in ER for further evaluation and management.      Len Azeez B Kiriana Worthington   Sheldon Sem, [place], [place] 04/30/24 [phone]

## 2024-04-30 NOTE — Discharge Instructions (Addendum)
  1. Acute viral syndrome (Primary) - POC Covid19/Flu A&B Antigen performed in UC is negative for COVID and influenza.  Antigen testing is most accurate after symptoms have been present for 48 to 72 hours.  Retesting for COVID after 2 to 3 days of symptoms is recommended. - POCT rapid strep A performed in UC is negative for strep pharyngitis - acetaminophen  (TYLENOL ) tablet 975 mg given in UC for acute fever - azelastine  (ASTELIN ) 0.1 % nasal spray; Place 1 spray into both nostrils 2 (two) times daily. Use in each nostril as directed  Dispense: 30 mL; Refill: 1 - promethazine -dextromethorphan (PROMETHAZINE -DM) 6.25-15 MG/5ML syrup; Take 5 mLs by mouth 4 (four) times daily as needed for cough.  Dispense: 118 mL; Refill: 0 - predniSONE  (DELTASONE ) 20 MG tablet; Take 2 tablets (40 mg total) by mouth daily for 5 days.  Dispense: 10 tablet; Refill: 0 -Continue to monitor symptoms for any change in severity if there is any escalation of current symptoms or development of new symptoms follow-up in ER for further evaluation and management.

## 2024-04-30 NOTE — ED Triage Notes (Signed)
 Pt c/o fever, body aches, headache, sore throat, cough that began last night.

## 2024-05-02 ENCOUNTER — Ambulatory Visit
Admission: RE | Admit: 2024-05-02 | Discharge: 2024-05-02 | Disposition: A | Discharge status: Home / Self Care | Source: Ambulatory Visit | Attending: Family Medicine | Admitting: Family Medicine

## 2024-05-02 VITALS — BP 117/82 | HR 112 | Temp 98.6°F | Resp 18 | Wt 324.3 lb

## 2024-05-02 DIAGNOSIS — J020 Streptococcal pharyngitis: Secondary | ICD-10-CM | POA: Diagnosis not present

## 2024-05-02 LAB — POCT RAPID STREP A (OFFICE): Rapid Strep A Screen: POSITIVE — AB

## 2024-05-02 MED ORDER — AZITHROMYCIN 250 MG PO TABS: 250.0000 mg | ORAL_TABLET | Freq: Every day | ORAL | 0 refills | Status: DC

## 2024-05-02 NOTE — ED Provider Notes (Signed)
 [facility]   [id number] 05/02/24 Arrival Time: 1751  ASSESSMENT & PLAN:  1. Streptococcal sore throat    No signs of peritonsillar abscess.  Reports PCN allergy. Begin: Meds ordered this encounter  Medications   azithromycin  (ZITHROMAX ) 250 MG tablet    Sig: Take 1 tablet (250 mg total) by mouth daily. Take first 2 tablets together, then 1 every day until finished.    Dispense:  6 tablet    Refill:  0    Results for orders placed or performed during the hospital encounter of 05/02/24  POCT rapid strep A   Collection Time: 05/02/24  6:30 PM  Result Value Ref Range   Rapid Strep A Screen Positive (A) Negative    OTC analgesics and throat care as needed  Instructed to finish full course of antibiotics. Will follow up if not showing significant improvement over the next 24-48 hours. Work note provided.  Reviewed expectations re: course of current medical issues. Questions answered. Outlined signs and symptoms indicating need for more acute intervention. Patient verbalized understanding. After Visit Summary given.   SUBJECTIVE:  Brian Alvarez is a 38 y.o. male who reports a sore throat. Child with + strep. Ques subj fever. Tolerating PO. No tx PTA.   OBJECTIVE:  Vitals:   05/02/24 1813 05/02/24 1816  BP:  117/82  Pulse:  (!) 112  Resp:  18  Temp:  98.6 F (37 C)  TempSrc:  Oral  SpO2:  97%  Weight: (!) 147.1 kg     General appearance: alert; no distress HEENT: throat with moderate erythema; uvula is midline Neck: supple with FROM; small bilateral cervical LAD Lungs: speaks full sentences without difficulty; unlabored Abd: soft; non-tender Skin: reveals no rash; warm and dry Psychological: alert and cooperative; normal mood and affect  Allergies  Allergen Reactions   Penicillins     Hives Has patient had a PCN reaction causing immediate rash, facial/tongue/throat swelling, SOB or lightheadedness with hypotension: yes Has patient had a PCN  reaction causing severe rash involving mucus membranes or skin necrosis: yes Has patient had a PCN reaction that required hospitalization: no Has patient had a PCN reaction occurring within the last 10 years: yes If all of the above answers are NO, then may proceed with Cephalosporin use.   Rosemary Oil     Past Medical History:  Diagnosis Date   ADHD    Allergy    Anemia    Anxiety    Asthma    Carpal tunnel syndrome    Depression    HTN (hypertension)    Insomnia    Lumbar herniated disc    Obesity    Social History   Socioeconomic History   Marital status: Married    Spouse name: Not on file   Number of children: 1   Years of education: Not on file   Highest education level: Some college, no degree  Occupational History   Occupation: Tech support  Tobacco Use   Smoking status: Former    Current packs/day: 0.00    Types: Cigarettes    Quit date: 2014    Years since quitting: 11.6    Passive exposure: Past   Smokeless tobacco: Never   Tobacco comments:    has switched to electronic cigarette  Vaping Use   Vaping status: Some Days   Substances: Nicotine, Flavoring  Substance and Sexual Activity   Alcohol use: Yes    Comment: occassional   Drug use: No   Sexual activity:  Yes  Other Topics Concern   Not on file  Social History Narrative   Not on file   Social Drivers of Health   Financial Resource Strain: Low Risk  (01/28/2023)   Overall Financial Resource Strain (CARDIA)    Difficulty of Paying Living Expenses: Not very hard  Food Insecurity: No Food Insecurity (01/28/2023)   Hunger Vital Sign    Worried About Running Out of Food in the Last Year: Never true    Ran Out of Food in the Last Year: Never true  Transportation Needs: No Transportation Needs (01/28/2023)   PRAPARE - Administrator, Civil Service (Medical): No    Lack of Transportation (Non-Medical): No  Physical Activity: Insufficiently Active (01/28/2023)   Exercise Vital Sign     Days of Exercise per Week: 1 day    Minutes of Exercise per Session: 30 min  Stress: Stress Concern Present (01/28/2023)   [employer] of Occupational Health - Occupational Stress Questionnaire    Feeling of Stress : Rather much  Social Connections: Socially Isolated (01/28/2023)   Social Connection and Isolation Panel    Frequency of Communication with Friends and Family: Once a week    Frequency of Social Gatherings with Friends and Family: Never    Attends Religious Services: Never    Database administrator or Organizations: No    Attends Engineer, structural: Not on file    Marital Status: Married  Catering manager Violence: Not on file   Family History  Problem Relation Age of Onset   Drug abuse Mother    Heart failure Mother    Hypertension Mother    Arthritis Maternal Grandmother    Heart disease Maternal Grandfather    Colon cancer Neg Hx    Stomach cancer Neg Hx    Esophageal cancer Neg Hx            Rolinda Rogue, MD 05/02/24 1925

## 2024-05-02 NOTE — ED Triage Notes (Signed)
 would like to be tested for strep. Was seen at another urgent care on Monday, negative strep, flue, and covid tests. At home covid and flue today, daughter tested positive for strep today. Suspect it was too early for detection at previous visit. - Entered by patient

## 2024-05-14 ENCOUNTER — Ambulatory Visit: Admitting: Nurse Practitioner

## 2024-05-16 ENCOUNTER — Encounter: Payer: Self-pay | Admitting: Nurse Practitioner

## 2024-06-15 ENCOUNTER — Other Ambulatory Visit: Payer: Self-pay | Admitting: Nurse Practitioner

## 2024-06-15 NOTE — Telephone Encounter (Signed)
 Requesting: VENLAFAXINE  ER 37.5MG  CAPSULES  Last Visit: 04/19/2024 Next Visit: Visit date not found Last Refill: 04/19/2024  Please Advise

## 2024-06-18 ENCOUNTER — Encounter: Payer: Self-pay | Admitting: Nurse Practitioner

## 2024-06-18 ENCOUNTER — Ambulatory Visit (INDEPENDENT_AMBULATORY_CARE_PROVIDER_SITE_OTHER): Admitting: Nurse Practitioner

## 2024-06-18 VITALS — BP 128/78 | HR 88 | Temp 97.3°F | Ht 68.0 in | Wt 332.8 lb

## 2024-06-18 DIAGNOSIS — F32A Depression, unspecified: Secondary | ICD-10-CM

## 2024-06-18 DIAGNOSIS — H9201 Otalgia, right ear: Secondary | ICD-10-CM | POA: Diagnosis not present

## 2024-06-18 DIAGNOSIS — F419 Anxiety disorder, unspecified: Secondary | ICD-10-CM

## 2024-06-18 DIAGNOSIS — F909 Attention-deficit hyperactivity disorder, unspecified type: Secondary | ICD-10-CM

## 2024-06-18 DIAGNOSIS — Z23 Encounter for immunization: Secondary | ICD-10-CM

## 2024-06-18 DIAGNOSIS — J4520 Mild intermittent asthma, uncomplicated: Secondary | ICD-10-CM | POA: Diagnosis not present

## 2024-06-18 DIAGNOSIS — G5603 Carpal tunnel syndrome, bilateral upper limbs: Secondary | ICD-10-CM

## 2024-06-18 MED ORDER — VENLAFAXINE HCL ER 37.5 MG PO CP24: 37.5000 mg | ORAL_CAPSULE | Freq: Every day | ORAL | 1 refills | Status: DC

## 2024-06-18 MED ORDER — ALBUTEROL SULFATE HFA 108 (90 BASE) MCG/ACT IN AERS: 2.0000 | INHALATION_SPRAY | RESPIRATORY_TRACT | 6 refills | Status: DC | PRN

## 2024-06-18 MED ORDER — AMPHETAMINE-DEXTROAMPHETAMINE 10 MG PO TABS: 10.0000 mg | ORAL_TABLET | Freq: Two times a day (BID) | ORAL | 0 refills | Status: DC

## 2024-06-18 NOTE — Assessment & Plan Note (Signed)
Chronic, stable.  Albuterol inhaler refill sent to the pharmacy.

## 2024-06-18 NOTE — Assessment & Plan Note (Signed)
 Chronic, stable. Symptoms have improved with venlafaxine . Continue 37.5mg  daily. Follow-up in 3 months.

## 2024-06-18 NOTE — Progress Notes (Signed)
 Established Patient Office Visit  Subjective   Patient ID: Brian Alvarez, male    DOB: [birth year]  Age: 38 y.o. MRN: [id number]  Chief Complaint  Patient presents with   Ear Pain    Right ear pain for 2 weeks, pain in hands with numbness, Rx refill, Flu Vaccine, Covid Rx    HPI Discussed the use of AI scribe software for clinical note transcription with the patient, who gave verbal consent to proceed.  History of Present Illness   Brian Alvarez is a 38 year old male who presents with right ear pain and hand pain.  He experiences occasional right ear pain that is bothersome but does not interfere with work. He also has a stuffy nose, sore throat, sneezing, and headaches, particularly during this time of year, with no fever. He uses Tylenol , aspirin, ibuprofen , and another NSAID for symptom relief. Benadryl is effective for allergy symptoms but affects his sleep.  Hand pain occurs when holding and gripping objects, especially when twisting, progressing to numbness and weakness, particularly in the ring and middle fingers, eventually involving the whole hand. Recent work tasks with repetitive hand movements have exacerbated the pain. He denies any recent hand injuries. A past nerve conduction study indicated delayed signal transmission. He uses braces or splints at night but finds them uncomfortable and often removes them. Nerve glide exercises provide some relief.  He manages anxiety and depression with venlafaxine , experiencing withdrawal symptoms if a dose is missed. Overall, it has helped with his anxiety and depression significantly.       ROS See pertinent positives and negatives per HPI.    Objective:     BP 128/78 (BP Location: Right Arm, Patient Position: Sitting, Cuff Size: Large)   Pulse 88   Temp (!) 97.3 F (36.3 C)   Ht 5' 8 (1.727 m)   Wt (!) 332 lb 12.8 oz (151 kg)   SpO2 96%   BMI 50.60 kg/m  BP Readings from Last 3 Encounters:  06/18/24 128/78  05/02/24  117/82  04/30/24 (!) 145/73   Wt Readings from Last 3 Encounters:  06/18/24 (!) 332 lb 12.8 oz (151 kg)  05/02/24 (!) 324 lb 4.8 oz (147.1 kg)  04/19/24 (!) 324 lb 3.2 oz (147.1 kg)      Physical Exam Vitals and nursing note reviewed.  Constitutional:      Appearance: Normal appearance.  HENT:     Head: Normocephalic.     Right Ear: Tympanic membrane, ear canal and external ear normal.     Left Ear: Tympanic membrane, ear canal and external ear normal.  Eyes:     Conjunctiva/sclera: Conjunctivae normal.  Cardiovascular:     Rate and Rhythm: Normal rate and regular rhythm.     Pulses: Normal pulses.     Heart sounds: Normal heart sounds.  Pulmonary:     Effort: Pulmonary effort is normal.     Breath sounds: Normal breath sounds.  Musculoskeletal:        General: No tenderness. Normal range of motion.     Cervical back: Normal range of motion and neck supple. No tenderness.  Lymphadenopathy:     Cervical: No cervical adenopathy.  Skin:    General: Skin is warm.  Neurological:     General: No focal deficit present.     Mental Status: He is alert and oriented to person, place, and time.     Comments: Phalen's test negative bilaterally  Psychiatric:  Mood and Affect: Mood normal.        Behavior: Behavior normal.        Thought Content: Thought content normal.        Judgment: Judgment normal.     The 10-year ASCVD risk score (Arnett DK, et al., 2019) is: 1.4%    Assessment & Plan:   Problem List Items Addressed This Visit       Respiratory   Asthma   Chronic, stable.  Albuterol  inhaler refill sent to the pharmacy.      Relevant Medications   albuterol  (VENTOLIN  HFA) 108 (90 Base) MCG/ACT inhaler     Nervous and Auditory   Carpal tunnel syndrome   Chronic symptoms are affecting his daily activities and have worsened recently. A previous nerve conduction study showed delayed signal transmission. Refer to ortho and continue daily stretches.        Relevant Medications   amphetamine -dextroamphetamine  (ADDERALL) 10 MG tablet   venlafaxine  XR (EFFEXOR -XR) 37.5 MG 24 hr capsule   Other Relevant Orders   Ambulatory referral to Orthopedic Surgery     Other   Attention deficit hyperactivity disorder (ADHD)   Chronic, stable. Continue adderall 10mg  BID prn. PDMP reviewed. Follow-up in 3 months.       Anxiety and depression - Primary   Chronic, stable. Symptoms have improved with venlafaxine . Continue 37.5mg  daily. Follow-up in 3 months.       Relevant Medications   venlafaxine  XR (EFFEXOR -XR) 37.5 MG 24 hr capsule   Other Visit Diagnoses       Right ear pain       No signs of infection. May be from allergies vs recent dental work. Continue allergy medication daily.     Immunization due       Flu vaccine given today   Relevant Orders   Flu vaccine trivalent PF, 6mos and older(Flulaval,Afluria,Fluarix,Fluzone) (Completed)       Return in about 3 months (around 09/17/2024) for adhd, anxiety, depression .    Brian DELENA Harada, NP

## 2024-06-18 NOTE — Assessment & Plan Note (Signed)
 Chronic symptoms are affecting his daily activities and have worsened recently. A previous nerve conduction study showed delayed signal transmission. Refer to ortho and continue daily stretches.

## 2024-06-18 NOTE — Assessment & Plan Note (Signed)
 Chronic, stable. Continue adderall 10mg  BID prn. PDMP reviewed. Follow-up in 3 months.

## 2024-06-18 NOTE — Patient Instructions (Signed)
 It was great to see you!  Keep doing stretches. I have placed a referral to orthopedics - they will call to schedule   I have refilled your medications   Let's follow-up in 3 months, sooner if you have concerns.  If a referral was placed today, you will be contacted for an appointment. Please note that routine referrals can sometimes take up to 3-4 weeks to process. Please call our office if you haven't heard anything after this time frame.  Take care,  Tinnie Harada, NP

## 2024-07-09 ENCOUNTER — Ambulatory Visit: Admitting: Orthopedic Surgery

## 2024-07-09 DIAGNOSIS — G5603 Carpal tunnel syndrome, bilateral upper limbs: Secondary | ICD-10-CM | POA: Diagnosis not present

## 2024-07-09 NOTE — Progress Notes (Signed)
 Brian Alvarez - 38 y.o. male MRN [id number]  Date of birth: [birth year]  Office Visit Note: Visit Date: 07/09/2024 PCP: Brian Tinnie LABOR, NP Referred by: Brian Tinnie LABOR, NP  Subjective: No chief complaint on file.  HPI: Brian Alvarez is a pleasant 38 y.o. male who presents today for evaluation of ongoing numbness and tingling in the bilateral hands, mainly radial sided digits.  Symptoms have been progressive now over the past multiple years, worsening in nature.  Does have some ongoing nocturnal symptoms as well.  Has tried bracing in the past which was not well-tolerated.  Has also tried activity modification without significant relief.  Does utilize his hands quite frequently, and is having some significant limitations due to the numbness and tingling with activities of daily living.  Pertinent ROS were reviewed with the patient and found to be negative unless otherwise specified above in HPI.   Visit Reason: bilateral hands Duration of symptoms: years Hand dominance: right Occupation: Teacher, early years/pre, Set designer Diabetic: No Smoking: Yes Heart/Lung History: asthma Blood Thinners: none  Prior Testing/EMG: none Injections (Date): none Treatments: braces in past- couldn't wear Prior Surgery: none    Assessment & Plan: Visit Diagnoses:  1. Bilateral carpal tunnel syndrome     Plan: Extensive discussion was had with the patient today about his ongoing upper extremity symptoms.  He is demonstrating signs symptoms consistent with bilateral carpal tunnel syndrome.  We discussed obtaining electrodiagnostic studies of the bilateral upper extremities in order to better delineate severity of potential nerve pathology in order to better guide treatment moving forward.  We did discuss the possibility of carpal tunnel release in the future, both open and endoscopic as well as risks and benefits and postoperative protocols.  He will obtain bilateral electrodiagnostic diagnostic studies as  instructed and will return to me in the near future to discuss results and appropriate next steps.   Follow-up: No follow-ups on file.   Meds & Orders: No orders of the defined types were placed in this encounter.   Orders Placed This Encounter  Procedures   Ambulatory referral to Physical Medicine Rehab     Procedures: No procedures performed      Clinical History: No specialty comments available.  He reports that he quit smoking about 11 years ago. His smoking use included cigarettes. He has been exposed to tobacco smoke. He has never used smokeless tobacco.  Recent Labs    04/19/24 1330  HGBA1C 6.2    Objective:   Vital Signs: There were no vitals taken for this visit.  Physical Exam  Gen: Well-appearing, in no acute distress; non-toxic CV: Regular Rate. Well-perfused. Warm.  Resp: Breathing unlabored on room air; no wheezing. Psych: Fluid speech in conversation; appropriate affect; normal thought process  Ortho Exam PHYSICAL EXAM:  General: Patient is well appearing and in no distress.   Skin and Muscle: No significant skin changes are apparent to upper extremities.   Range of Motion and Palpation Tests: Mobility is full about the elbows with flexion and extension. Forearm supination and pronation are 85/85 bilaterally.  Wrist flexion/extension is 75/65 bilaterally.  Digital flexion and extension are full.  Thumb opposition is full to the base of the small fingers bilaterally.    No cords or nodules are palpated.  No triggering is observed.    Neurologic, Vascular, Motor: Sensation is slightly diminished to light touch in the bilateral median nerve distribution.    Thenar atrophy: Negative bilaterally Tinel sign: Positive bilateral carpal tunnel Carpal tunnel  compression: Positive bilaterally Phalen test: Positive bilateral  Motor bilateral hand FPL: 5/5 Index FDP: 5/5 APB: 5/5  Fingers pink and well perfused.  Capillary refill is brisk.     Lab  Results  Component Value Date   HGBA1C 6.2 04/19/2024      Imaging: No results found.  Past Medical/Family/Surgical/Social History: Medications & Allergies reviewed per EMR, new medications updated. Patient Active Problem List   Diagnosis Date Noted   Vitamin D  insufficiency 04/19/2024   Prediabetes 04/19/2024   Erectile dysfunction 11/08/2023   Anxiety and depression 11/08/2023   Abscess 09/29/2023   Chronic fatigue 12/03/2022   Primary hypertension 09/03/2022   Acute maxillary sinusitis 07/09/2022   Controlled substance agreement signed 06/07/2022   Chronic right-sided low back pain with right-sided sciatica 06/07/2022   Parasomnia, unspecified 10/19/2017   Reactive airway disease 10/19/2017   Confusional arousals 03/28/2017   PLMD (periodic limb movement disorder) 11/24/2016   Morbid obesity with body mass index of 45.0-49.9 in adult [facility]) 11/24/2016   Hypersomnia with sleep apnea 11/24/2016   Snoring 11/24/2016   Attention deficit hyperactivity disorder (ADHD) 04/29/2016   Seasonal allergies 09/12/2012   Asthma 12/26/2011   Carpal tunnel syndrome 11/11/2011   Past Medical History:  Diagnosis Date   ADHD    Allergy    Anemia    Anxiety    Asthma    Carpal tunnel syndrome    Depression    HTN (hypertension)    Insomnia    Lumbar herniated disc    Obesity    Family History  Problem Relation Age of Onset   Drug abuse Mother    Heart failure Mother    Hypertension Mother    Arthritis Maternal Grandmother    Heart disease Maternal Grandfather    Colon cancer Neg Hx    Stomach cancer Neg Hx    Esophageal cancer Neg Hx    Past Surgical History:  Procedure Laterality Date   LUMBAR DISC SURGERY     Social History   Occupational History   Occupation: Tech support  Tobacco Use   Smoking status: Former    Current packs/day: 0.00    Types: Cigarettes    Quit date: 2014    Years since quitting: 11.8    Passive exposure: Past   Smokeless tobacco: Never    Tobacco comments:    has switched to electronic cigarette  Vaping Use   Vaping status: Some Days   Substances: Nicotine, Flavoring  Substance and Sexual Activity   Alcohol use: Yes    Comment: occassional   Drug use: No   Sexual activity: Yes    Merik Mignano Afton Alderton, M.D. [facility], Hand Surgery

## 2024-07-20 ENCOUNTER — Ambulatory Visit: Admitting: Physical Medicine and Rehabilitation

## 2024-07-20 ENCOUNTER — Other Ambulatory Visit: Payer: Self-pay | Admitting: Nurse Practitioner

## 2024-07-20 DIAGNOSIS — M79642 Pain in left hand: Secondary | ICD-10-CM | POA: Diagnosis not present

## 2024-07-20 DIAGNOSIS — R202 Paresthesia of skin: Secondary | ICD-10-CM | POA: Diagnosis not present

## 2024-07-20 DIAGNOSIS — M79641 Pain in right hand: Secondary | ICD-10-CM | POA: Diagnosis not present

## 2024-07-20 DIAGNOSIS — R29898 Other symptoms and signs involving the musculoskeletal system: Secondary | ICD-10-CM | POA: Diagnosis not present

## 2024-07-20 NOTE — Progress Notes (Signed)
 Pain Scale   Average Pain 1 Patient advising he has bilateral pain , numbness and tingling with some weakness at times. Patient is right hand dominate        +Driver, -BT, -Dye Allergies.

## 2024-07-20 NOTE — Telephone Encounter (Signed)
 Requesting: venlafaxine  XR (EFFEXOR -XR) 37.5 MG 24 hr capsule Last Visit: 06/18/2024 Next Visit: 09/17/2024 Last Refill: 06/18/24   Please Advise

## 2024-07-20 NOTE — Progress Notes (Signed)
 Brian Alvarez - 38 y.o. male MRN [id number]  Date of birth: [birth year]  Office Visit Note: Visit Date: 07/20/2024 PCP: Nedra Tinnie LABOR, NP Referred by: Arlinda Buster, MD  Subjective: Chief Complaint  Patient presents with   Right Hand - Pain, Numbness, Weakness   Left Hand - Pain, Numbness, Weakness   HPI: Brian Alvarez is a 38 y.o. male who comes in today at the request of Dr. Anshul Agarwala for evaluation and management of chronic, worsening and severe pain, numbness and tingling in the Bilateral upper extremities.  Patient is Right hand dominant.  Reports actually many years of hand pain with some tingling and numbness.  He has had worsening over the last several months however of fairly significant numbness and tingling in the radial digits bilaterally somewhat more right than left but fairly equal.  He does have nocturnal complaints with positive flick sign.  He did try bracing but tells me he did not really help him very much at all and he had difficult time with those.  He uses his hands quite a bit for daily activities and is quite active with his hands overall.  He denies any frank radicular symptoms.  Does endorse some neck pain.  He is not diabetic or hypothyroid.  He has not had prior electrodiagnostic studies.  He does endorse some weakness at times.   I spent more than 30 minutes speaking face-to-face with the patient with 50% of the time in counseling and discussing coordination of care.      Review of Systems  Musculoskeletal:  Positive for joint pain.  Neurological:  Positive for tingling and focal weakness.  All other systems reviewed and are negative.  Otherwise per HPI.  Assessment & Plan: Visit Diagnoses:    ICD-10-CM   1. Paresthesia of skin  R20.2 NCV with EMG (electromyography)    2. Bilateral hand pain  M79.641    M79.642     3. Weakness of both hands  R29.898        Plan: Impression: Clinically seems to have some level of underlying carpal tunnel  syndrome and musculotendinous type dysfunction pain.  Electrodiagnostic study performed today.  The above electrodiagnostic study is ABNORMAL and reveals evidence of a moderate bilateral median nerve entrapment at the wrist (carpal tunnel syndrome) affecting sensory and motor components.   There is no significant electrodiagnostic evidence of any other focal nerve entrapment, brachial plexopathy or cervical radiculopathy.   Recommendations: 1.  Follow-up with referring physician. 2.  Continue current management of symptoms. 3.  Continue use of resting splint at night-time and as needed during the day. 4.  Suggest surgical evaluation.  Meds & Orders: No orders of the defined types were placed in this encounter.   Orders Placed This Encounter  Procedures   NCV with EMG (electromyography)    Follow-up: Return for Anshul Agarwala, MD.   Procedures: No procedures performed  EMG & NCV Findings: Evaluation of the left median motor and the right median motor nerves showed prolonged distal onset latency (L4.4, R4.3 ms) and decreased conduction velocity (Elbow-Wrist, L48, R49 m/s).  The left median (across palm) sensory and the right median (across palm) sensory nerves showed prolonged distal peak latency (Wrist, L4.1, R4.3 ms).  The left ulnar sensory nerve showed reduced amplitude (10.8 V).  All remaining nerves (as indicated in the following tables) were within normal limits.  Left vs. Right side comparison data for the ulnar motor nerve indicates abnormal L-R velocity difference (A Elbow-B Elbow,  18 m/s).  All remaining left vs. right side differences were within normal limits.    All examined muscles (as indicated in the following table) showed no evidence of electrical instability.    Impression: The above electrodiagnostic study is ABNORMAL and reveals evidence of a moderate bilateral median nerve entrapment at the wrist (carpal tunnel syndrome) affecting sensory and motor components.    There is no significant electrodiagnostic evidence of any other focal nerve entrapment, brachial plexopathy or cervical radiculopathy.   Recommendations: 1.  Follow-up with referring physician. 2.  Continue current management of symptoms. 3.  Continue use of resting splint at night-time and as needed during the day. 4.  Suggest surgical evaluation.  ___________________________ Prentice Masters FAAPMR Board Certified, American Board of Physical Medicine and Rehabilitation    Nerve Conduction Studies Anti Sensory Summary Table   Stim Site NR Peak (ms) Norm Peak (ms) P-T Amp (V) Norm P-T Amp Site1 Site2 Delta-P (ms) Dist (cm) Vel (m/s) Norm Vel (m/s)  Left Median Acr Palm Anti Sensory (2nd Digit)  31.5C  Wrist    *4.1 <3.6 16.9 >10 Wrist Palm 2.3 0.0    Palm    1.8 <2.0 10.6         Right Median Acr Palm Anti Sensory (2nd Digit)  30.9C  Wrist    *4.3 <3.6 15.6 >10 Wrist Palm 2.5 0.0    Palm    1.8 <2.0 14.6         Left Radial Anti Sensory (Base 1st Digit)  31.2C  Wrist    2.0 <3.1 26.5  Wrist Base 1st Digit 2.0 0.0    Right Radial Anti Sensory (Base 1st Digit)  31.3C  Wrist    2.1 <3.1 8.0  Wrist Base 1st Digit 2.1 0.0    Left Ulnar Anti Sensory (5th Digit)  31.7C  Wrist    3.3 <3.7 *10.8 >15.0 Wrist 5th Digit 3.3 14.0 42 >38  Right Ulnar Anti Sensory (5th Digit)  31.4C  Wrist    3.1 <3.7 21.7 >15.0 Wrist 5th Digit 3.1 14.0 45 >38   Motor Summary Table   Stim Site NR Onset (ms) Norm Onset (ms) O-P Amp (mV) Norm O-P Amp Site1 Site2 Delta-0 (ms) Dist (cm) Vel (m/s) Norm Vel (m/s)  Left Median Motor (Abd Poll Brev)  31.8C  Wrist    *4.4 <4.2 10.4 >5 Elbow Wrist 4.6 22.0 *48 >50  Elbow    9.0  6.0         Right Median Motor (Abd Poll Brev)  31.3C  Wrist    *4.3 <4.2 8.6 >5 Elbow Wrist 4.2 20.5 *49 >50  Elbow    8.5  7.7         Left Ulnar Motor (Abd Dig Min)  31.8C  Wrist    3.3 <4.2 8.6 >3 B Elbow Wrist 3.5 20.0 57 >53  B Elbow    6.8  7.8  A Elbow B Elbow 2.0 11.0 55  >53  A Elbow    8.8  7.5         Right Ulnar Motor (Abd Dig Min)  31.3C  Wrist    2.8 <4.2 9.2 >3 B Elbow Wrist 3.4 21.0 62 >53  B Elbow    6.2  8.5  A Elbow B Elbow 1.5 11.0 73 >53  A Elbow    7.7  8.9          EMG   Side Muscle Nerve Root Ins Act Fibs Psw Amp Dur  Poly Recrt Int Bruna Comment  Right Abd Poll Brev Median C8-T1 Nml Nml Nml Nml Nml 0 Nml Nml   Right 1stDorInt Ulnar C8-T1 Nml Nml Nml Nml Nml 0 Nml Nml   Right PronatorTeres Median C6-7 Nml Nml Nml Nml Nml 0 Nml Nml   Right Biceps Musculocut C5-6 Nml Nml Nml Nml Nml 0 Nml Nml   Right Deltoid Axillary C5-6 Nml Nml Nml Nml Nml 0 Nml Nml     Nerve Conduction Studies Anti Sensory Left/Right Comparison   Stim Site L Lat (ms) R Lat (ms) L-R Lat (ms) L Amp (V) R Amp (V) L-R Amp (%) Site1 Site2 L Vel (m/s) R Vel (m/s) L-R Vel (m/s)  Median Acr Palm Anti Sensory (2nd Digit)  31.5C  Wrist *4.1 *4.3 0.2 16.9 15.6 7.7 Wrist Palm     Palm 1.8 1.8 0.0 10.6 14.6 27.4       Radial Anti Sensory (Base 1st Digit)  31.2C  Wrist 2.0 2.1 0.1 26.5 8.0 69.8 Wrist Base 1st Digit     Ulnar Anti Sensory (5th Digit)  31.7C  Wrist 3.3 3.1 0.2 *10.8 21.7 50.2 Wrist 5th Digit 42 45 3   Motor Left/Right Comparison   Stim Site L Lat (ms) R Lat (ms) L-R Lat (ms) L Amp (mV) R Amp (mV) L-R Amp (%) Site1 Site2 L Vel (m/s) R Vel (m/s) L-R Vel (m/s)  Median Motor (Abd Poll Brev)  31.8C  Wrist *4.4 *4.3 0.1 10.4 8.6 17.3 Elbow Wrist *48 *49 1  Elbow 9.0 8.5 0.5 6.0 7.7 22.1       Ulnar Motor (Abd Dig Min)  31.8C  Wrist 3.3 2.8 0.5 8.6 9.2 6.5 B Elbow Wrist 57 62 5  B Elbow 6.8 6.2 0.6 7.8 8.5 8.2 A Elbow B Elbow 55 73 *18  A Elbow 8.8 7.7 1.1 7.5 8.9 15.7          Waveforms:                     Clinical History: No specialty comments available.   He reports that he quit smoking about 11 years ago. His smoking use included cigarettes. He has been exposed to tobacco smoke. He has never used smokeless tobacco.  Recent Labs     04/19/24 1330  HGBA1C 6.2    Objective:  VS:  HT:    WT:   BMI:     BP:   HR: bpm  TEMP: ( )  RESP:  Physical Exam Vitals and nursing note reviewed.  Constitutional:      General: He is not in acute distress.    Appearance: Normal appearance. He is well-developed. He is obese.  HENT:     Head: Normocephalic and atraumatic.  Eyes:     Conjunctiva/sclera: Conjunctivae normal.     Pupils: Pupils are equal, round, and reactive to light.  Cardiovascular:     Rate and Rhythm: Normal rate.     Pulses: Normal pulses.     Heart sounds: Normal heart sounds.  Pulmonary:     Effort: Pulmonary effort is normal. No respiratory distress.  Musculoskeletal:        General: Tenderness present.     Cervical back: Normal range of motion and neck supple. No rigidity.     Right lower leg: No edema.     Left lower leg: No edema.     Comments: Inspection reveals no atrophy of the bilateral APB or FDI or hand intrinsics. There  is no swelling, color changes, allodynia or dystrophic changes. There is 5 out of 5 strength in the bilateral wrist extension, finger abduction and long finger flexion. There is intact sensation to light touch in all dermatomal and peripheral nerve distributions. There is a positive Phalen's test bilaterally. There is a negative Hoffmann's test bilaterally.  Skin:    General: Skin is warm and dry.     Findings: No erythema or rash.  Neurological:     General: No focal deficit present.     Mental Status: He is alert and oriented to person, place, and time.     Cranial Nerves: No cranial nerve deficit.     Sensory: No sensory deficit.     Motor: No weakness or abnormal muscle tone.     Coordination: Coordination normal.     Gait: Gait normal.  Psychiatric:        Mood and Affect: Mood normal.        Behavior: Behavior normal.        Thought Content: Thought content normal.     Ortho Exam  Imaging: No results found.  Past Medical/Family/Surgical/Social  History: Medications & Allergies reviewed per EMR, new medications updated. Patient Active Problem List   Diagnosis Date Noted   Vitamin D  insufficiency 04/19/2024   Prediabetes 04/19/2024   Erectile dysfunction 11/08/2023   Anxiety and depression 11/08/2023   Abscess 09/29/2023   Chronic fatigue 12/03/2022   Primary hypertension 09/03/2022   Acute maxillary sinusitis 07/09/2022   Controlled substance agreement signed 06/07/2022   Chronic right-sided low back pain with right-sided sciatica 06/07/2022   Parasomnia, unspecified 10/19/2017   Reactive airway disease 10/19/2017   Confusional arousals 03/28/2017   PLMD (periodic limb movement disorder) 11/24/2016   Morbid obesity with body mass index of 45.0-49.9 in adult [facility]) 11/24/2016   Hypersomnia with sleep apnea 11/24/2016   Snoring 11/24/2016   Attention deficit hyperactivity disorder (ADHD) 04/29/2016   Seasonal allergies 09/12/2012   Asthma 12/26/2011   Carpal tunnel syndrome 11/11/2011   Past Medical History:  Diagnosis Date   ADHD    Allergy    Anemia    Anxiety    Asthma    Carpal tunnel syndrome    Depression    HTN (hypertension)    Insomnia    Lumbar herniated disc    Obesity    Family History  Problem Relation Age of Onset   Drug abuse Mother    Heart failure Mother    Hypertension Mother    Arthritis Maternal Grandmother    Heart disease Maternal Grandfather    Colon cancer Neg Hx    Stomach cancer Neg Hx    Esophageal cancer Neg Hx    Past Surgical History:  Procedure Laterality Date   LUMBAR DISC SURGERY     Social History   Occupational History   Occupation: Tech support  Tobacco Use   Smoking status: Former    Current packs/day: 0.00    Types: Cigarettes    Quit date: 2014    Years since quitting: 11.8    Passive exposure: Past   Smokeless tobacco: Never   Tobacco comments:    has switched to electronic cigarette  Vaping Use   Vaping status: Some Days   Substances: Nicotine,  Flavoring  Substance and Sexual Activity   Alcohol use: Yes    Comment: occassional   Drug use: No   Sexual activity: Yes

## 2024-07-20 NOTE — Procedures (Signed)
 EMG & NCV Findings: Evaluation of the left median motor and the right median motor nerves showed prolonged distal onset latency (L4.4, R4.3 ms) and decreased conduction velocity (Elbow-Wrist, L48, R49 m/s).  The left median (across palm) sensory and the right median (across palm) sensory nerves showed prolonged distal peak latency (Wrist, L4.1, R4.3 ms).  The left ulnar sensory nerve showed reduced amplitude (10.8 V).  All remaining nerves (as indicated in the following tables) were within normal limits.  Left vs. Right side comparison data for the ulnar motor nerve indicates abnormal L-R velocity difference (A Elbow-B Elbow, 18 m/s).  All remaining left vs. right side differences were within normal limits.    All examined muscles (as indicated in the following table) showed no evidence of electrical instability.    Impression: The above electrodiagnostic study is ABNORMAL and reveals evidence of a moderate bilateral median nerve entrapment at the wrist (carpal tunnel syndrome) affecting sensory and motor components.   There is no significant electrodiagnostic evidence of any other focal nerve entrapment, brachial plexopathy or cervical radiculopathy.   Recommendations: 1.  Follow-up with referring physician. 2.  Continue current management of symptoms. 3.  Continue use of resting splint at night-time and as needed during the day. 4.  Suggest surgical evaluation.  ___________________________ Prentice Masters FAAPMR Board Certified, American Board of Physical Medicine and Rehabilitation    Nerve Conduction Studies Anti Sensory Summary Table   Stim Site NR Peak (ms) Norm Peak (ms) P-T Amp (V) Norm P-T Amp Site1 Site2 Delta-P (ms) Dist (cm) Vel (m/s) Norm Vel (m/s)  Left Median Acr Palm Anti Sensory (2nd Digit)  31.5C  Wrist    *4.1 <3.6 16.9 >10 Wrist Palm 2.3 0.0    Palm    1.8 <2.0 10.6         Right Median Acr Palm Anti Sensory (2nd Digit)  30.9C  Wrist    *4.3 <3.6 15.6 >10 Wrist Palm  2.5 0.0    Palm    1.8 <2.0 14.6         Left Radial Anti Sensory (Base 1st Digit)  31.2C  Wrist    2.0 <3.1 26.5  Wrist Base 1st Digit 2.0 0.0    Right Radial Anti Sensory (Base 1st Digit)  31.3C  Wrist    2.1 <3.1 8.0  Wrist Base 1st Digit 2.1 0.0    Left Ulnar Anti Sensory (5th Digit)  31.7C  Wrist    3.3 <3.7 *10.8 >15.0 Wrist 5th Digit 3.3 14.0 42 >38  Right Ulnar Anti Sensory (5th Digit)  31.4C  Wrist    3.1 <3.7 21.7 >15.0 Wrist 5th Digit 3.1 14.0 45 >38   Motor Summary Table   Stim Site NR Onset (ms) Norm Onset (ms) O-P Amp (mV) Norm O-P Amp Site1 Site2 Delta-0 (ms) Dist (cm) Vel (m/s) Norm Vel (m/s)  Left Median Motor (Abd Poll Brev)  31.8C  Wrist    *4.4 <4.2 10.4 >5 Elbow Wrist 4.6 22.0 *48 >50  Elbow    9.0  6.0         Right Median Motor (Abd Poll Brev)  31.3C  Wrist    *4.3 <4.2 8.6 >5 Elbow Wrist 4.2 20.5 *49 >50  Elbow    8.5  7.7         Left Ulnar Motor (Abd Dig Min)  31.8C  Wrist    3.3 <4.2 8.6 >3 B Elbow Wrist 3.5 20.0 57 >53  B Elbow    6.8  7.8  A Elbow B Elbow 2.0 11.0 55 >53  A Elbow    8.8  7.5         Right Ulnar Motor (Abd Dig Min)  31.3C  Wrist    2.8 <4.2 9.2 >3 B Elbow Wrist 3.4 21.0 62 >53  B Elbow    6.2  8.5  A Elbow B Elbow 1.5 11.0 73 >53  A Elbow    7.7  8.9          EMG   Side Muscle Nerve Root Ins Act Fibs Psw Amp Dur Poly Recrt Int Bruna Comment  Right Abd Poll Brev Median C8-T1 Nml Nml Nml Nml Nml 0 Nml Nml   Right 1stDorInt Ulnar C8-T1 Nml Nml Nml Nml Nml 0 Nml Nml   Right PronatorTeres Median C6-7 Nml Nml Nml Nml Nml 0 Nml Nml   Right Biceps Musculocut C5-6 Nml Nml Nml Nml Nml 0 Nml Nml   Right Deltoid Axillary C5-6 Nml Nml Nml Nml Nml 0 Nml Nml     Nerve Conduction Studies Anti Sensory Left/Right Comparison   Stim Site L Lat (ms) R Lat (ms) L-R Lat (ms) L Amp (V) R Amp (V) L-R Amp (%) Site1 Site2 L Vel (m/s) R Vel (m/s) L-R Vel (m/s)  Median Acr Palm Anti Sensory (2nd Digit)  31.5C  Wrist *4.1 *4.3 0.2 16.9 15.6 7.7  Wrist Palm     Palm 1.8 1.8 0.0 10.6 14.6 27.4       Radial Anti Sensory (Base 1st Digit)  31.2C  Wrist 2.0 2.1 0.1 26.5 8.0 69.8 Wrist Base 1st Digit     Ulnar Anti Sensory (5th Digit)  31.7C  Wrist 3.3 3.1 0.2 *10.8 21.7 50.2 Wrist 5th Digit 42 45 3   Motor Left/Right Comparison   Stim Site L Lat (ms) R Lat (ms) L-R Lat (ms) L Amp (mV) R Amp (mV) L-R Amp (%) Site1 Site2 L Vel (m/s) R Vel (m/s) L-R Vel (m/s)  Median Motor (Abd Poll Brev)  31.8C  Wrist *4.4 *4.3 0.1 10.4 8.6 17.3 Elbow Wrist *48 *49 1  Elbow 9.0 8.5 0.5 6.0 7.7 22.1       Ulnar Motor (Abd Dig Min)  31.8C  Wrist 3.3 2.8 0.5 8.6 9.2 6.5 B Elbow Wrist 57 62 5  B Elbow 6.8 6.2 0.6 7.8 8.5 8.2 A Elbow B Elbow 55 73 *18  A Elbow 8.8 7.7 1.1 7.5 8.9 15.7          Waveforms:

## 2024-07-23 ENCOUNTER — Encounter: Payer: Self-pay | Admitting: Radiology

## 2024-07-31 NOTE — Progress Notes (Unsigned)
 Brian Alvarez - 38 y.o. male MRN [id number]  Date of birth: 02-24-1986  Office Visit Note: Visit Date: 08/01/2024 PCP: Nedra Tinnie LABOR, NP Referred by: Nedra Tinnie LABOR, NP  Subjective: No chief complaint on file.  HPI: Brian Alvarez is a pleasant 38 y.o. male who returns today for follow-up of ongoing numbness and tingling in the bilateral hands, mainly radial sided digits.  Symptoms have been progressive now over the past multiple years, worsening in nature.  Does have some ongoing nocturnal symptoms as well.  Has tried bracing in the past which was not well-tolerated.  Has also tried activity modification without significant relief.  Does utilize his hands quite frequently, and is having some significant limitations due to the numbness and tingling with activities of daily living.  He underwent recent EMGs of the bilateral upper extremities which did show moderate carpal tunnel syndrome bilaterally.  Pertinent ROS were reviewed with the patient and found to be negative unless otherwise specified above in HPI.      Assessment & Plan: Visit Diagnoses:  1. Bilateral carpal tunnel syndrome      Plan: Extensive discussion was had with the patient today about his ongoing bilateral carpal tunnel syndrome that is refractory to conservative care.  Patient has both clinical and electrodiagnostic evidence to confirm this diagnosis.  We discussed further conservative treatments in the form of bracing, further therapy and potential injections.  However given that his symptoms are persisting and with his ongoing electrodiagnostic findings, he would like to pursue surgical intervention.    At this juncture, he is indicated for bilateral, staged open versus endoscopic carpal tunnel release.  Risks and benefits of both operations were discussed in detail today.  Understanding all risks and benefits, patient would like to have surgery done in the form of endoscopic carpal tunnel release.  He would like  to begin with the left side.  Risks include but not limited to infection, bleeding, scarring, stiffness, nerve injury or vascular, tendon injury, risk of recurrence and need for subsequent operation were all discussed in detail.  Patient consented understanding the above.  Will move forward surgical scheduling.  We did also discuss at length his smoking, he does use a nicotine vape which I have recommended that he reduce/limit as much as he can in the perioperative phase to minimize potential postoperative complications.  Ideally, he would not utilize nicotine products during this time.  He expressed full understanding.    Follow-up: No follow-ups on file.   Meds & Orders: No orders of the defined types were placed in this encounter.   No orders of the defined types were placed in this encounter.    Procedures: No procedures performed      Clinical History: No specialty comments available.  He reports that he quit smoking about 11 years ago. His smoking use included cigarettes. He has been exposed to tobacco smoke. He has never used smokeless tobacco.  Recent Labs    04/19/24 1330  HGBA1C 6.2    Objective:   Vital Signs: There were no vitals taken for this visit.  Physical Exam  Gen: Well-appearing, in no acute distress; non-toxic CV: Regular Rate. Well-perfused. Warm.  Resp: Breathing unlabored on room air; no wheezing. Psych: Fluid speech in conversation; appropriate affect; normal thought process  Ortho Exam PHYSICAL EXAM:  General: Patient is well appearing and in no distress.   Skin and Muscle: No significant skin changes are apparent to upper extremities.   Range of Motion and  Palpation Tests: Mobility is full about the elbows with flexion and extension. Forearm supination and pronation are 85/85 bilaterally.  Wrist flexion/extension is 75/65 bilaterally.  Digital flexion and extension are full.  Thumb opposition is full to the base of the small fingers bilaterally.     No cords or nodules are palpated.  No triggering is observed.    Neurologic, Vascular, Motor: Sensation is slightly diminished to light touch in the bilateral median nerve distribution.    Thenar atrophy: Negative bilaterally Tinel sign: Positive bilateral carpal tunnel Carpal tunnel compression: Positive bilaterally Phalen test: Positive bilateral  Motor bilateral hand FPL: 5/5 Index FDP: 5/5 APB: 5/5  Fingers pink and well perfused.  Capillary refill is brisk.     Lab Results  Component Value Date   HGBA1C 6.2 04/19/2024      Imaging: No results found.  Past Medical/Family/Surgical/Social History: Medications & Allergies reviewed per EMR, new medications updated. Patient Active Problem List   Diagnosis Date Noted   Vitamin D  insufficiency 04/19/2024   Prediabetes 04/19/2024   Erectile dysfunction 11/08/2023   Anxiety and depression 11/08/2023   Abscess 09/29/2023   Chronic fatigue 12/03/2022   Primary hypertension 09/03/2022   Acute maxillary sinusitis 07/09/2022   Controlled substance agreement signed 06/07/2022   Chronic right-sided low back pain with right-sided sciatica 06/07/2022   Parasomnia, unspecified 10/19/2017   Reactive airway disease 10/19/2017   Confusional arousals 03/28/2017   PLMD (periodic limb movement disorder) 11/24/2016   Morbid obesity with body mass index of 45.0-49.9 in adult [facility]) 11/24/2016   Hypersomnia with sleep apnea 11/24/2016   Snoring 11/24/2016   Attention deficit hyperactivity disorder (ADHD) 04/29/2016   Seasonal allergies 09/12/2012   Asthma 12/26/2011   Carpal tunnel syndrome 11/11/2011   Past Medical History:  Diagnosis Date   ADHD    Allergy    Anemia    Anxiety    Asthma    Carpal tunnel syndrome    Depression    HTN (hypertension)    Insomnia    Lumbar herniated disc    Obesity    Family History  Problem Relation Age of Onset   Drug abuse Mother    Heart failure Mother    Hypertension Mother     Arthritis Maternal Grandmother    Heart disease Maternal Grandfather    Colon cancer Neg Hx    Stomach cancer Neg Hx    Esophageal cancer Neg Hx    Past Surgical History:  Procedure Laterality Date   LUMBAR DISC SURGERY     Social History   Occupational History   Occupation: Tech support  Tobacco Use   Smoking status: Former    Current packs/day: 0.00    Types: Cigarettes    Quit date: 2014    Years since quitting: 11.8    Passive exposure: Past   Smokeless tobacco: Never   Tobacco comments:    has switched to electronic cigarette  Vaping Use   Vaping status: Some Days   Substances: Nicotine, Flavoring  Substance and Sexual Activity   Alcohol use: Yes    Comment: occassional   Drug use: No   Sexual activity: Yes    Jermey Closs Afton Alderton, M.D. [facility], Hand Surgery

## 2024-08-01 ENCOUNTER — Ambulatory Visit: Admitting: Orthopedic Surgery

## 2024-08-01 DIAGNOSIS — G5603 Carpal tunnel syndrome, bilateral upper limbs: Secondary | ICD-10-CM | POA: Diagnosis not present

## 2024-08-14 ENCOUNTER — Other Ambulatory Visit: Payer: Self-pay

## 2024-08-14 DIAGNOSIS — G5603 Carpal tunnel syndrome, bilateral upper limbs: Secondary | ICD-10-CM

## 2024-08-23 ENCOUNTER — Encounter (HOSPITAL_BASED_OUTPATIENT_CLINIC_OR_DEPARTMENT_OTHER): Payer: Self-pay | Admitting: Orthopedic Surgery

## 2024-08-31 HISTORY — DX: Sleep apnea, unspecified: G47.30

## 2024-09-05 ENCOUNTER — Ambulatory Visit: Payer: Self-pay | Attending: Orthopedic Surgery | Admitting: Occupational Therapy

## 2024-09-06 ENCOUNTER — Other Ambulatory Visit: Payer: Self-pay

## 2024-09-06 DIAGNOSIS — G5603 Carpal tunnel syndrome, bilateral upper limbs: Secondary | ICD-10-CM

## 2024-09-12 ENCOUNTER — Encounter: Admitting: Orthopedic Surgery

## 2024-09-17 ENCOUNTER — Ambulatory Visit (INDEPENDENT_AMBULATORY_CARE_PROVIDER_SITE_OTHER): Payer: Self-pay | Admitting: Nurse Practitioner

## 2024-09-17 ENCOUNTER — Encounter: Payer: Self-pay | Admitting: Nurse Practitioner

## 2024-09-17 VITALS — BP 160/90 | HR 92 | Temp 97.6°F | Ht 68.0 in | Wt 338.2 lb

## 2024-09-17 DIAGNOSIS — F32A Depression, unspecified: Secondary | ICD-10-CM

## 2024-09-17 DIAGNOSIS — F909 Attention-deficit hyperactivity disorder, unspecified type: Secondary | ICD-10-CM

## 2024-09-17 DIAGNOSIS — I10 Essential (primary) hypertension: Secondary | ICD-10-CM

## 2024-09-17 DIAGNOSIS — F419 Anxiety disorder, unspecified: Secondary | ICD-10-CM

## 2024-09-17 DIAGNOSIS — J4520 Mild intermittent asthma, uncomplicated: Secondary | ICD-10-CM

## 2024-09-17 MED ORDER — ALBUTEROL SULFATE (2.5 MG/3ML) 0.083% IN NEBU: 2.5000 mg | INHALATION_SOLUTION | Freq: Four times a day (QID) | RESPIRATORY_TRACT | 1 refills | Status: AC | PRN

## 2024-09-17 MED ORDER — VENLAFAXINE HCL ER 37.5 MG PO CP24: 37.5000 mg | ORAL_CAPSULE | Freq: Every day | ORAL | 0 refills | Status: AC

## 2024-09-17 MED ORDER — PREDNISONE 20 MG PO TABS: 40.0000 mg | ORAL_TABLET | Freq: Every day | ORAL | 0 refills | Status: DC

## 2024-09-17 MED ORDER — ALBUTEROL SULFATE HFA 108 (90 BASE) MCG/ACT IN AERS: 2.0000 | INHALATION_SPRAY | RESPIRATORY_TRACT | 6 refills | Status: AC | PRN

## 2024-09-17 MED ORDER — AMPHETAMINE-DEXTROAMPHETAMINE 10 MG PO TABS: 10.0000 mg | ORAL_TABLET | Freq: Two times a day (BID) | ORAL | 0 refills | Status: AC

## 2024-09-17 NOTE — Patient Instructions (Signed)
 It was great to see you!  Start prednisone  2 tablets daily with food  I refilled your inhaler, nebulizer, and blood pressure medicine   Let's follow-up in 3 months, sooner if you have concerns.  If a referral was placed today, you will be contacted for an appointment. Please note that routine referrals can sometimes take up to 3-4 weeks to process. Please call our office if you haven't heard anything after this time frame.  Take care,  Tinnie Harada, NP

## 2024-09-17 NOTE — Assessment & Plan Note (Signed)
 Chronic, stable. Continue adderall 10mg  BID prn. PDMP reviewed. Follow-up in 3 months.

## 2024-09-17 NOTE — Assessment & Plan Note (Signed)
 Chronic, stable. Symptoms have improved with venlafaxine . Continue 37.5mg  daily. Follow-up in 3 months.

## 2024-09-17 NOTE — Assessment & Plan Note (Signed)
 The acute exacerbation is likely due to environmental triggers, with wheezing and a productive cough present. Increased albuterol  use is noted. Prescribe prednisone , 40mg  daily with food for 5 days. Refill albuterol  inhaler and nebulizer.

## 2024-09-17 NOTE — Progress Notes (Signed)
 "  Established Patient Office Visit  Subjective   Patient ID: Brian Alvarez, male    DOB: July 09, 1986  Age: 38 y.o. MRN: [id number]  Chief Complaint  Patient presents with   Anxiety and depression    Follow up, Rx refills, cough with wheezing for 1.5 with SOB    HPI  Discussed the use of AI scribe software for clinical note transcription with the patient, who gave verbal consent to proceed.  History of Present Illness   Brian Alvarez is a 38 year old male with anxiety and hypertension who presents with cough and wheezing.  He has had a rough, productive cough with yellow to white sputum and wheezing for about 1.5 weeks. He has mild nasal congestion and no fever. He has used his albuterol  inhaler frequently during this time.  Since last night, he has had right eye redness and itching that he associates with allergy exposure in a utility room.  His mood and social anxiety are improved on Venlafaxine , though he still feels tired and unmotivated at times.  He has been off olmesartan  for a couple of months due to loss of insurance, though his blood pressure had been well controlled when taking it. He denies chest pain and shortness of breath.   He recently lost insurance coverage and is currently uninsured until the first of next month, which has limited his ability to obtain medications. He plans to restart them once his new insurance is active.        09/17/2024    2:24 PM 04/19/2024    1:36 PM 11/07/2023    3:51 PM 03/07/2023    2:05 PM 12/03/2022    1:19 PM  Depression screen PHQ 2/9  Decreased Interest 1 0 2 0 0  Down, Depressed, Hopeless 1 1 3 1  0  PHQ - 2 Score 2 1 5 1  0  Altered sleeping 2 3 3 3 3   Tired, decreased energy 3 2 3 3 3   Change in appetite 3 1 2 1  0  Feeling bad or failure about yourself  1 1 2 1 1   Trouble concentrating 0 0 3 3 0  Moving slowly or fidgety/restless 0 0 0 0 0  Suicidal thoughts 0 0 0 0 0  PHQ-9 Score 11 8  18  12  7    Difficult doing work/chores  Somewhat difficult Very difficult Somewhat difficult Somewhat difficult Very difficult     Data saved with a previous flowsheet row definition      09/17/2024    2:24 PM 04/19/2024    1:36 PM 11/07/2023    3:53 PM 03/07/2023    2:05 PM  GAD 7 : Generalized Anxiety Score  Nervous, Anxious, on Edge 1 3 3 3   Control/stop worrying 0 1 3 2   Worry too much - different things 1 1 3 1   Trouble relaxing 0 1 1 2   Restless 0 0 0 0  Easily annoyed or irritable 1 0 1 1  Afraid - awful might happen 1 2 1 2   Total GAD 7 Score 4 8 12 11   Anxiety Difficulty Somewhat difficult Somewhat difficult Somewhat difficult Very difficult      ROS See pertinent positives and negatives per HPI.    Objective:     BP (!) 160/90 (BP Location: Right Arm, Cuff Size: Large)   Pulse 92   Temp 97.6 F (36.4 C)   Ht 5' 8 (1.727 m)   Wt (!) 338 lb 3.2 oz (153.4 kg)  SpO2 97%   BMI 51.42 kg/m  BP Readings from Last 3 Encounters:  09/17/24 (!) 160/90  06/18/24 128/78  05/02/24 117/82   Wt Readings from Last 3 Encounters:  09/17/24 (!) 338 lb 3.2 oz (153.4 kg)  06/18/24 (!) 332 lb 12.8 oz (151 kg)  05/02/24 (!) 324 lb 4.8 oz (147.1 kg)      Physical Exam Vitals and nursing note reviewed.  Constitutional:      Appearance: Normal appearance.  HENT:     Head: Normocephalic.     Right Ear: Tympanic membrane, ear canal and external ear normal.     Left Ear: Tympanic membrane, ear canal and external ear normal.     Mouth/Throat:     Mouth: Mucous membranes are moist.     Pharynx: No posterior oropharyngeal erythema.  Eyes:     Conjunctiva/sclera:     Right eye: Right conjunctiva is injected.     Left eye: Left conjunctiva is not injected.  Cardiovascular:     Rate and Rhythm: Normal rate and regular rhythm.     Pulses: Normal pulses.     Heart sounds: Normal heart sounds.  Pulmonary:     Effort: Pulmonary effort is normal.     Breath sounds: Normal breath sounds.  Musculoskeletal:      Cervical back: Normal range of motion.  Skin:    General: Skin is warm.  Neurological:     General: No focal deficit present.     Mental Status: He is alert and oriented to person, place, and time.  Psychiatric:        Mood and Affect: Mood normal.        Behavior: Behavior normal.        Thought Content: Thought content normal.        Judgment: Judgment normal.    The 10-year ASCVD risk score (Arnett DK, et al., 2019) is: 2.1%    Assessment & Plan:   Problem List Items Addressed This Visit       Cardiovascular and Mediastinum   Primary hypertension - Primary   Chronic, not controlled. Blood pressure is elevated at 160/90 mmHg due to not having olmesartan  x2 months and lifestyle factors. Refill olmesartan -hctz 20-12.5mg  daily and instruct to restart medication. Advise home blood pressure monitoring. Follow-up in 3 months or sooner if not coming back down below 140 systolic.         Respiratory   Asthma   The acute exacerbation is likely due to environmental triggers, with wheezing and a productive cough present. Increased albuterol  use is noted. Prescribe prednisone , 40mg  daily with food for 5 days. Refill albuterol  inhaler and nebulizer.      Relevant Medications   albuterol  (VENTOLIN  HFA) 108 (90 Base) MCG/ACT inhaler   albuterol  (PROVENTIL ) (2.5 MG/3ML) 0.083% nebulizer solution   predniSONE  (DELTASONE ) 20 MG tablet     Other   Attention deficit hyperactivity disorder (ADHD)   Chronic, stable. Continue adderall 10mg  BID prn. PDMP reviewed. Follow-up in 3 months.       Anxiety and depression   Chronic, stable. Symptoms have improved with venlafaxine . Continue 37.5mg  daily. Follow-up in 3 months.       Relevant Medications   venlafaxine  XR (EFFEXOR -XR) 37.5 MG 24 hr capsule    Return in about 3 months (around 12/16/2024) for CPE.    Tinnie DELENA Harada, NP  "

## 2024-09-17 NOTE — Assessment & Plan Note (Signed)
 Chronic, not controlled. Blood pressure is elevated at 160/90 mmHg due to not having olmesartan  x2 months and lifestyle factors. Refill olmesartan -hctz 20-12.5mg  daily and instruct to restart medication. Advise home blood pressure monitoring. Follow-up in 3 months or sooner if not coming back down below 140 systolic.

## 2024-10-01 ENCOUNTER — Other Ambulatory Visit: Payer: Self-pay

## 2024-10-01 ENCOUNTER — Encounter (HOSPITAL_BASED_OUTPATIENT_CLINIC_OR_DEPARTMENT_OTHER): Payer: Self-pay | Admitting: Orthopedic Surgery

## 2024-10-03 ENCOUNTER — Encounter (HOSPITAL_BASED_OUTPATIENT_CLINIC_OR_DEPARTMENT_OTHER)
Admission: RE | Admit: 2024-10-03 | Discharge: 2024-10-03 | Disposition: A | Discharge status: Home / Self Care | Payer: Self-pay | Source: Ambulatory Visit | Attending: Orthopedic Surgery | Admitting: Orthopedic Surgery

## 2024-10-03 LAB — BASIC METABOLIC PANEL WITH GFR
Anion gap: 12 (ref 5–15)
BUN: 13 mg/dL (ref 6–20)
CO2: 26 mmol/L (ref 22–32)
Calcium: 9.3 mg/dL (ref 8.9–10.3)
Chloride: 99 mmol/L (ref 98–111)
Creatinine, Ser: 0.94 mg/dL (ref 0.61–1.24)
GFR, Estimated: >60 mL/min
Glucose, Bld: 101 mg/dL — ABNORMAL HIGH (ref 70–99)
Potassium: 3.9 mmol/L (ref 3.5–5.1)
Sodium: 137 mmol/L (ref 135–145)

## 2024-10-03 NOTE — Progress Notes (Signed)
" ° ° ° °  Enhanced Recovery after Surgery for Orthopedics Enhanced Recovery after Surgery is a protocol used to improve the stress on your body and your recovery after surgery.  Patient Instructions  The night before surgery:  No food after midnight. ONLY clear liquids after midnight  The day of surgery (if you do NOT have diabetes):  Drink ONE (1) Pre-Surgery Clear Ensure as directed.   This drink was given to you during your hospital  pre-op appointment visit. The pre-op nurse will instruct you on the time to drink the  Pre-Surgery Ensure depending on your surgery time. Finish the drink at the designated time by the pre-op nurse.  Nothing else to drink after completing the  Pre-Surgery Clear Ensure.  The day of surgery (if you have diabetes): Drink ONE (1) Gatorade 2 (G2) as directed. This drink was given to you during your hospital  pre-op appointment visit.  The pre-op nurse will instruct you on the time to drink the   Gatorade 2 (G2) depending on your surgery time. Color of the Gatorade may vary. Red is not allowed. Nothing else to drink after completing the  Gatorade 2 (G2).         If you have questions, please contact your surgeons office.   Pt here for anesthesia consult. OK per Dr Paul "

## 2024-10-03 NOTE — Anesthesia Preprocedure Evaluation (Addendum)
"                                    Anesthesia Evaluation  Patient identified by MRN, date of birth, ID band Patient awake    Reviewed: Allergy & Precautions, NPO status , Patient's Chart, lab work & pertinent test results  History of Anesthesia Complications Negative for: history of anesthetic complications  Airway Mallampati: III  TM Distance: >3 FB Neck ROM: Full    Dental  (+) Missing, Dental Advisory Given,    Pulmonary asthma , sleep apnea , neg recent URI, Patient abstained from smoking., former smoker   Pulmonary exam normal        Cardiovascular hypertension, Pt. on medications Normal cardiovascular exam Rhythm:Regular Rate:Normal     Neuro/Psych  PSYCHIATRIC DISORDERS Anxiety Depression       GI/Hepatic ,GERD  Controlled,,  Endo/Other    Class 3 obesity  Renal/GU      Musculoskeletal   Abdominal  (+) + obese  Peds  Hematology  (+) Blood dyscrasia, anemia   Anesthesia Other Findings LEFT CARPAL TUNNEL SYNDROME  Reproductive/Obstetrics                              Anesthesia Physical Anesthesia Plan  ASA: 3  Anesthesia Plan: General   Post-op Pain Management: Ofirmev  IV (intra-op)* and Toradol  IV (intra-op)*   Induction: Intravenous  PONV Risk Score and Plan: 2 and Ondansetron , Dexamethasone , Treatment may vary due to age or medical condition and Midazolam   Airway Management Planned: LMA and Oral ETT  Additional Equipment:   Intra-op Plan:   Post-operative Plan: Extubation in OR  Informed Consent: I have reviewed the patients History and Physical, chart, labs and discussed the procedure including the risks, benefits and alternatives for the proposed anesthesia with the patient or authorized representative who has indicated his/her understanding and acceptance.     Dental advisory given  Plan Discussed with: CRNA  Anesthesia Plan Comments: (Patient was seen in PAT due to BMI>50 for airway  assessment. He has had previous GETA but airway note is not available in EMR. He is not aware of any problems encountered during intubation. He has a MP4 airway but no other concerning features. I do not anticipate any difficulty, especially with Glidescope backup availability at [facility]. I explained that the final anesthetic plan would be determined by his anesthesiologist on day of surgery. Lawence, MD)         Anesthesia Quick Evaluation  "

## 2024-10-05 ENCOUNTER — Encounter (HOSPITAL_BASED_OUTPATIENT_CLINIC_OR_DEPARTMENT_OTHER): Payer: Self-pay | Admitting: Anesthesiology

## 2024-10-05 ENCOUNTER — Encounter (HOSPITAL_BASED_OUTPATIENT_CLINIC_OR_DEPARTMENT_OTHER)
Admission: RE | Disposition: A | Discharge status: Home / Self Care | Payer: Self-pay | Source: Home / Self Care | Attending: Orthopedic Surgery

## 2024-10-05 ENCOUNTER — Encounter (HOSPITAL_BASED_OUTPATIENT_CLINIC_OR_DEPARTMENT_OTHER): Payer: Self-pay | Admitting: Orthopedic Surgery

## 2024-10-05 ENCOUNTER — Other Ambulatory Visit: Payer: Self-pay

## 2024-10-05 ENCOUNTER — Ambulatory Visit (HOSPITAL_BASED_OUTPATIENT_CLINIC_OR_DEPARTMENT_OTHER)
Admission: RE | Admit: 2024-10-05 | Discharge: 2024-10-05 | Disposition: A | Discharge status: Home / Self Care | Payer: Self-pay | Attending: Orthopedic Surgery | Admitting: Orthopedic Surgery

## 2024-10-05 ENCOUNTER — Ambulatory Visit (HOSPITAL_BASED_OUTPATIENT_CLINIC_OR_DEPARTMENT_OTHER): Payer: Self-pay | Admitting: Anesthesiology

## 2024-10-05 DIAGNOSIS — Z87891 Personal history of nicotine dependence: Secondary | ICD-10-CM | POA: Insufficient documentation

## 2024-10-05 DIAGNOSIS — I10 Essential (primary) hypertension: Secondary | ICD-10-CM | POA: Insufficient documentation

## 2024-10-05 DIAGNOSIS — F419 Anxiety disorder, unspecified: Secondary | ICD-10-CM | POA: Insufficient documentation

## 2024-10-05 DIAGNOSIS — Z79899 Other long term (current) drug therapy: Secondary | ICD-10-CM | POA: Insufficient documentation

## 2024-10-05 DIAGNOSIS — J45909 Unspecified asthma, uncomplicated: Secondary | ICD-10-CM | POA: Insufficient documentation

## 2024-10-05 DIAGNOSIS — G5602 Carpal tunnel syndrome, left upper limb: Secondary | ICD-10-CM | POA: Insufficient documentation

## 2024-10-05 DIAGNOSIS — E66813 Obesity, class 3: Secondary | ICD-10-CM | POA: Insufficient documentation

## 2024-10-05 DIAGNOSIS — Z01818 Encounter for other preprocedural examination: Secondary | ICD-10-CM

## 2024-10-05 DIAGNOSIS — F32A Depression, unspecified: Secondary | ICD-10-CM | POA: Insufficient documentation

## 2024-10-05 DIAGNOSIS — Z6841 Body Mass Index (BMI) 40.0 and over, adult: Secondary | ICD-10-CM | POA: Insufficient documentation

## 2024-10-05 DIAGNOSIS — G4730 Sleep apnea, unspecified: Secondary | ICD-10-CM | POA: Insufficient documentation

## 2024-10-05 HISTORY — PX: CARPAL TUNNEL RELEASE: SHX101

## 2024-10-05 HISTORY — DX: Sleep apnea, unspecified: G47.30

## 2024-10-05 MED ORDER — FENTANYL CITRATE (PF) 100 MCG/2ML IJ SOLN: 25.0000 ug | INTRAMUSCULAR | Status: DC | PRN

## 2024-10-05 MED ORDER — ONDANSETRON HCL 4 MG/2ML IJ SOLN
INTRAMUSCULAR | Status: AC
  Filled 2024-10-05: qty 2

## 2024-10-05 MED ORDER — DROPERIDOL 2.5 MG/ML IJ SOLN: 0.6250 mg | Freq: Once | INTRAMUSCULAR | Status: DC | PRN

## 2024-10-05 MED ORDER — PHENYLEPHRINE 80 MCG/ML (10ML) SYRINGE FOR IV PUSH (FOR BLOOD PRESSURE SUPPORT)
PREFILLED_SYRINGE | INTRAVENOUS | Status: DC | PRN
  Administered 2024-10-05 (×3): 160 ug via INTRAVENOUS

## 2024-10-05 MED ORDER — OXYCODONE HCL 5 MG PO TABS: 5.0000 mg | ORAL_TABLET | Freq: Once | ORAL | Status: DC | PRN

## 2024-10-05 MED ORDER — PROPOFOL 10 MG/ML IV BOLUS
INTRAVENOUS | Status: AC
  Filled 2024-10-05: qty 20

## 2024-10-05 MED ORDER — DEXMEDETOMIDINE HCL IN NACL 80 MCG/20ML IV SOLN
INTRAVENOUS | Status: AC
  Filled 2024-10-05: qty 20

## 2024-10-05 MED ORDER — 0.9 % SODIUM CHLORIDE (POUR BTL) OPTIME
TOPICAL | Status: DC | PRN
  Administered 2024-10-05: 1000 mL

## 2024-10-05 MED ORDER — FENTANYL CITRATE (PF) 100 MCG/2ML IJ SOLN
INTRAMUSCULAR | Status: AC
  Filled 2024-10-05: qty 2

## 2024-10-05 MED ORDER — PHENYLEPHRINE HCL (PRESSORS) 10 MG/ML IV SOLN
INTRAVENOUS | Status: AC
  Filled 2024-10-05: qty 1

## 2024-10-05 MED ORDER — DEXAMETHASONE SOD PHOSPHATE PF 10 MG/ML IJ SOLN
INTRAMUSCULAR | Status: DC | PRN
  Administered 2024-10-05: 5 mg via INTRAVENOUS

## 2024-10-05 MED ORDER — LACTATED RINGERS IV SOLN: INTRAVENOUS | Status: DC

## 2024-10-05 MED ORDER — MIDAZOLAM HCL 2 MG/2ML IJ SOLN
INTRAMUSCULAR | Status: AC
  Filled 2024-10-05: qty 2

## 2024-10-05 MED ORDER — OXYCODONE HCL 5 MG/5ML PO SOLN: 5.0000 mg | Freq: Once | ORAL | Status: DC | PRN

## 2024-10-05 MED ORDER — LIDOCAINE HCL (PF) 1 % IJ SOLN
INTRAMUSCULAR | Status: AC
  Filled 2024-10-05: qty 120

## 2024-10-05 MED ORDER — DEXAMETHASONE SOD PHOSPHATE PF 10 MG/ML IJ SOLN
INTRAMUSCULAR | Status: AC
  Filled 2024-10-05: qty 1

## 2024-10-05 MED ORDER — LIDOCAINE-EPINEPHRINE (PF) 1 %-1:200000 IJ SOLN
INTRAMUSCULAR | Status: AC
  Filled 2024-10-05: qty 120

## 2024-10-05 MED ORDER — CEFAZOLIN SODIUM 1 G IJ SOLR
INTRAMUSCULAR | Status: AC
  Filled 2024-10-05: qty 30

## 2024-10-05 MED ORDER — PROPOFOL 10 MG/ML IV BOLUS
INTRAVENOUS | Status: DC | PRN
  Administered 2024-10-05: 300 mg via INTRAVENOUS
  Administered 2024-10-05: 200 ug/kg/min via INTRAVENOUS

## 2024-10-05 MED ORDER — ONDANSETRON HCL 4 MG/2ML IJ SOLN
INTRAMUSCULAR | Status: DC | PRN
  Administered 2024-10-05: 4 mg via INTRAVENOUS

## 2024-10-05 MED ORDER — LIDOCAINE-EPINEPHRINE (PF) 1 %-1:200000 IJ SOLN
INTRAMUSCULAR | Status: DC | PRN
  Administered 2024-10-05: 10 mL

## 2024-10-05 MED ORDER — PROPOFOL 500 MG/50ML IV EMUL
INTRAVENOUS | Status: AC
  Filled 2024-10-05: qty 100

## 2024-10-05 MED ORDER — MIDAZOLAM HCL (PF) 2 MG/2ML IJ SOLN
INTRAMUSCULAR | Status: DC | PRN
  Administered 2024-10-05 (×2): 1 mg via INTRAVENOUS

## 2024-10-05 MED ORDER — CEFAZOLIN SODIUM-DEXTROSE 2-3 GM-%(50ML) IV SOLR
INTRAVENOUS | Status: DC | PRN
  Administered 2024-10-05: 3 g via INTRAVENOUS

## 2024-10-05 MED ORDER — DEXMEDETOMIDINE HCL IN NACL 80 MCG/20ML IV SOLN
INTRAVENOUS | Status: DC | PRN
  Administered 2024-10-05: 8 ug via INTRAVENOUS

## 2024-10-05 MED ORDER — PHENYLEPHRINE HCL-NACL 20-0.9 MG/250ML-% IV SOLN
INTRAVENOUS | Status: DC | PRN
  Administered 2024-10-05: 50 ug/min via INTRAVENOUS

## 2024-10-05 MED ORDER — FENTANYL CITRATE (PF) 100 MCG/2ML IJ SOLN
INTRAMUSCULAR | Status: DC | PRN
  Administered 2024-10-05 (×2): 50 ug via INTRAVENOUS

## 2024-10-05 MED ORDER — OXYCODONE HCL 5 MG PO TABS: 5.0000 mg | ORAL_TABLET | Freq: Four times a day (QID) | ORAL | 0 refills | Status: AC | PRN

## 2024-10-05 NOTE — Discharge Instructions (Addendum)
    Hand Surgery Postop Instructions    Dressings: Maintain postoperative dressing for 5 days.   After 5 days, it is okay to unwrap postoperative dressing and apply Band-Aid or rewrap.   Keep operative site clean and dry until orthopedic follow-up.  Wound Care: Keep your hand elevated above the level of your heart.  Do not allow it to dangle by your side. Moving your fingers is advised to stimulate circulation but will depend on the site of your surgery.  If you have a splint applied, your doctor will advise you regarding movement.  Activity: Do not drive or operate machinery until clearance given from physician. No heavy lifting with operative extremity.  Diet:  Drink liquids today or eat a light diet.  You may resume a regular diet tomorrow.    General expectations: Pain for two to three days. Take prescribed medication if given, transition to over-the-counter medication as quickly as possible. Fingers may become slightly swollen.  Call your doctor if any of the following occur: Severe pain not relieved by pain medication. Elevated temperature. Dressing soaked with blood. Inability to move fingers. White or bluish color to fingers.   Per [facility] clinic policy, our goal is ensure optimal postoperative pain control with a multimodal pain management strategy. For all [facility] patients, our goal is to wean post-operative narcotic medications by 6 weeks post-operatively. If this is not possible due to utilization of pain medication prior to surgery, your [facility] doctor will support your acute post-operative pain control for the first 6 weeks postoperatively, with a plan to transition you back to your primary pain team following that. Cyndia Skeeters will work to ensure a Therapist, occupational.  Anshul Trevor Mace, M.D. Hand Surgery [facility]   Post Anesthesia Home Care Instructions  Activity: Get plenty of rest for the remainder of the day. A responsible individual  must stay with you for 24 hours following the procedure.  For the next 24 hours, DO NOT: -Drive a car -Advertising copywriter -Drink alcoholic beverages -Take any medication unless instructed by your physician -Make any legal decisions or sign important papers.  Meals: Start with liquid foods such as gelatin or soup. Progress to regular foods as tolerated. Avoid greasy, spicy, heavy foods. If nausea and/or vomiting occur, drink only clear liquids until the nausea and/or vomiting subsides. Call your physician if vomiting continues.  Special Instructions/Symptoms: Your throat may feel dry or sore from the anesthesia or the breathing tube placed in your throat during surgery. If this causes discomfort, gargle with warm salt water. The discomfort should disappear within 24 hours.  If you had a scopolamine patch placed behind your ear for the management of post- operative nausea and/or vomiting:  1. The medication in the patch is effective for 72 hours, after which it should be removed.  Wrap patch in a tissue and discard in the trash. Wash hands thoroughly with soap and water. 2. You may remove the patch earlier than 72 hours if you experience unpleasant side effects which may include dry mouth, dizziness or visual disturbances. 3. Avoid touching the patch. Wash your hands with soap and water after contact with the patch.

## 2024-10-05 NOTE — Anesthesia Procedure Notes (Signed)
 Procedure Name: LMA Insertion Date/Time: 10/05/2024 7:38 AM  Performed by: Delayne Olam BIRCH, CRNAPre-anesthesia Checklist: Patient identified, Emergency Drugs available, Suction available and Patient being monitored Patient Re-evaluated:Patient Re-evaluated prior to induction Oxygen Delivery Method: Circle system utilized Preoxygenation: Pre-oxygenation with 100% oxygen Induction Type: IV induction Ventilation: Mask ventilation without difficulty LMA: LMA with gastric port inserted LMA Size: 4.0 and 5.0 Number of attempts: 1 Airway Equipment and Method: Bite block Placement Confirmation: positive ETCO2 Tube secured with: Tape Dental Injury: Teeth and Oropharynx as per pre-operative assessment

## 2024-10-05 NOTE — H&P (Signed)
 @LOGODEPT @  Brian Alvarez - 39 y.o. male MRN [id number]  Date of birth: 03-02-1986   HAND SURGERY H&P UPDATE   HPI: Patient is a 39 y.o. male who presents with left carpal tunnel syndrome, here today for left endoscopic carpal tunnel release.  Patient denies any changes to their medical history or new systemic symptoms today.    Past Medical History:  Diagnosis Date   ADHD    Allergy    Anemia    Anxiety    Asthma    Carpal tunnel syndrome    Depression    HTN (hypertension)    Insomnia    Lumbar herniated disc    Obesity    Sleep apnea    Past Surgical History:  Procedure Laterality Date   LUMBAR DISC SURGERY     Social History   Socioeconomic History   Marital status: Married    Spouse name: Mary   Number of children: 1   Years of education: Not on file   Highest education level: Some college, no degree  Occupational History   Occupation: Tech support  Tobacco Use   Smoking status: Former    Current packs/day: 0.00    Types: Cigarettes    Quit date: 2014    Years since quitting: 12.0    Passive exposure: Past   Smokeless tobacco: Never   Tobacco comments:    has switched to electronic cigarette  Vaping Use   Vaping status: Some Days   Substances: Nicotine, Flavoring  Substance and Sexual Activity   Alcohol use: Yes    Comment: occassional   Drug use: No   Sexual activity: Yes  Other Topics Concern   Not on file  Social History Narrative   Not on file   Social Drivers of Health   Tobacco Use: Medium Risk (10/01/2024)   Patient History    Smoking Tobacco Use: Former    Smokeless Tobacco Use: Never    Passive Exposure: Past  Physicist, Medical Strain: Low Risk (01/28/2023)   Overall Financial Resource Strain (CARDIA)    Difficulty of Paying Living Expenses: Not very hard  Food Insecurity: No Food Insecurity (01/28/2023)   Hunger Vital Sign    Worried About Running Out of Food in the Last Year: Never true    Ran Out of Food in the Last Year: Never  true  Transportation Needs: No Transportation Needs (01/28/2023)   PRAPARE - Administrator, Civil Service (Medical): No    Lack of Transportation (Non-Medical): No  Physical Activity: Insufficiently Active (01/28/2023)   Exercise Vital Sign    Days of Exercise per Week: 1 day    Minutes of Exercise per Session: 30 min  Stress: Stress Concern Present (01/28/2023)   [employer] of Occupational Health - Occupational Stress Questionnaire    Feeling of Stress : Rather much  Social Connections: Socially Isolated (01/28/2023)   Social Connection and Isolation Panel    Frequency of Communication with Friends and Family: Once a week    Frequency of Social Gatherings with Friends and Family: Never    Attends Religious Services: Never    Database Administrator or Organizations: No    Attends Banker Meetings: Not on file    Marital Status: Married  Depression (PHQ2-9): High Risk (09/17/2024)   Depression (PHQ2-9)    PHQ-2 Score: 11  Alcohol Screen: Low Risk (01/28/2023)   Alcohol Screen    Last Alcohol Screening Score (AUDIT): 2  Housing: Low Risk (01/28/2023)  Housing    Last Housing Risk Score: 0  Utilities: Not on file  Health Literacy: Not on file   Family History  Problem Relation Age of Onset   Drug abuse Mother    Heart failure Mother    Hypertension Mother    Arthritis Maternal Grandmother    Heart disease Maternal Grandfather    Colon cancer Neg Hx    Stomach cancer Neg Hx    Esophageal cancer Neg Hx    - negative except otherwise stated in the family history section Allergies[1] Prior to Admission medications  Medication Sig Start Date End Date Taking? Authorizing Provider  albuterol  (VENTOLIN  HFA) 108 (90 Base) MCG/ACT inhaler Inhale 2 puffs into the lungs every 4 (four) hours as needed for wheezing or shortness of breath. 09/17/24  Yes McElwee, Lauren A, NP  amphetamine -dextroamphetamine  (ADDERALL) 10 MG tablet Take 1 tablet (10 mg total) by  mouth 2 (two) times daily. 09/17/24  Yes McElwee, Lauren A, NP  cholecalciferol (VITAMIN D3) 25 MCG (1000 UNIT) tablet Take 2,000 Units by mouth daily.   Yes [provider]  Multiple Vitamins-Minerals (MULTIVITAMIN WITH MINERALS) tablet Take 1 tablet by mouth daily.   Yes [provider]  olmesartan -hydrochlorothiazide (BENICAR  HCT) 20-12.5 MG tablet TAKE 1 TABLET BY MOUTH DAILY 03/20/24  Yes McElwee, Lauren A, NP  venlafaxine  XR (EFFEXOR -XR) 37.5 MG 24 hr capsule Take 1 capsule (37.5 mg total) by mouth daily with breakfast. 09/17/24  Yes McElwee, Lauren A, NP  albuterol  (PROVENTIL ) (2.5 MG/3ML) 0.083% nebulizer solution Take 3 mLs (2.5 mg total) by nebulization every 6 (six) hours as needed for wheezing or shortness of breath. 09/17/24   McElwee, Lauren A, NP  azelastine  (ASTELIN ) 0.1 % nasal spray Place 1 spray into both nostrils 2 (two) times daily. Use in each nostril as directed 04/30/24   Reddick, Johnathan B, NP  EPINEPHrine  (EPIPEN  2-PAK) 0.3 mg/0.3 mL IJ SOAJ injection Inject 0.3 mg into the muscle as needed for anaphylaxis. 01/28/23   McElwee, Lauren A, NP  fluticasone  (FLONASE ) 50 MCG/ACT nasal spray Place 2 sprays into both nostrils daily. 12/09/20   Mortenson, Ashley, MD  Fluticasone  Furoate (ARNUITY ELLIPTA ) 50 MCG/ACT AEPB Inhale 1 puff into the lungs daily at 6 (six) AM. Rinse mouth after using Patient not taking: Reported on 09/17/2024 10/28/23   McElwee, Tinnie LABOR, NP  ibuprofen  (ADVIL ) 600 MG tablet Take 1 tablet (600 mg total) by mouth every 6 (six) hours as needed. 12/09/20   Mortenson, Ashley, MD  sildenafil  (VIAGRA ) 100 MG tablet Take 0.5-1 tablets (50-100 mg total) by mouth daily as needed for erectile dysfunction. 11/07/23   McElwee, Lauren A, NP  traZODone (DESYREL) 50 MG tablet Take 50 mg by mouth at bedtime. Patient taking differently: Take 50 mg by mouth as needed. 12/14/21   [provider]  levocetirizine (XYZAL ) 5 MG tablet Take 5 mg by mouth every  evening.  12/09/20  [provider]  montelukast  (SINGULAIR ) 10 MG tablet Take one daily at bedtime Patient taking differently: Take 10 mg by mouth at bedtime.  10/22/13 01/13/20  Humberto Elspeth LABOR, MD   No results found. - Positive ROS: All other systems have been reviewed and were otherwise negative with the exception of those mentioned in the HPI and as above.  PHYSICAL EXAM:   General: Patient is well appearing and in no distress.    Skin and Muscle: No significant skin changes are apparent to upper extremities.    Range of Motion and  Palpation Tests: Mobility is full about the elbows with flexion and extension. Forearm supination and pronation are 85/85 bilaterally.  Wrist flexion/extension is 75/65 bilaterally.  Digital flexion and extension are full.  Thumb opposition is full to the base of the small fingers bilaterally.     No cords or nodules are palpated.  No triggering is observed.     Neurologic, Vascular, Motor: Sensation is slightly diminished to light touch in the bilateral median nerve distribution.    Thenar atrophy: Negative bilaterally Tinel sign: Positive bilateral carpal tunnel Carpal tunnel compression: Positive bilaterally Phalen test: Positive bilateral   Motor bilateral hand FPL: 5/5 Index FDP: 5/5 APB: 5/5   Fingers pink and well perfused.  Capillary refill is brisk.     Assessment/Plan: OR today for left endoscopic carpal tunnel release. We again reviewed the risks of surgery which include conversion to open surgery, bleeding, infection, damage to neurovascular structures, persistent symptoms, need for additional surgery.  Informed consent was signed.  All questions were answered.   Terri[facility], Hand Surgery      [1]  Allergies Allergen Reactions   Penicillins     Hives Has patient had a PCN reaction causing immediate rash, facial/tongue/throat swelling, SOB or lightheadedness with hypotension: yes Has patient had a PCN  reaction causing severe rash involving mucus membranes or skin necrosis: yes Has patient had a PCN reaction that required hospitalization: no Has patient had a PCN reaction occurring within the last 10 years: yes If all of the above answers are NO, then may proceed with Cephalosporin use.   Rosemary Oil

## 2024-10-05 NOTE — Op Note (Signed)
 NAME: Brian Alvarez MEDICAL RECORD NO: [id number] DATE OF BIRTH: September 20, 1986 FACILITY: [facility] LOCATION: [facility] PHYSICIAN: GILDARDO ALDERTON, MD   OPERATIVE REPORT   DATE OF PROCEDURE: 10/05/24    PREOPERATIVE DIAGNOSIS: Left carpal tunnel syndrome  POSTOPERATIVE DIAGNOSIS: Left carpal tunnel syndrome   PROCEDURE: Left endoscopic carpal tunnel release   SURGEON:  Gildardo Alderton, M.D.   ASSISTANT: Joesph Hooks, OPA   ANESTHESIA:  General   INTRAVENOUS FLUIDS:  Per anesthesia flow sheet.   ESTIMATED BLOOD LOSS:  Minimal.   COMPLICATIONS:  None.   SPECIMENS:  none   TOURNIQUET TIME:    Total Tourniquet Time Documented: Upper Arm (Left) - 15 minutes Total: Upper Arm (Left) - 15 minutes    DISPOSITION:  Stable to PACU.   INDICATIONS: 39 year old male with clinical and electrodiagnostic evidence of left-sided carpal tunnel syndrome refractory to conservative care.  Patient was indicated for open versus endoscopic carpal tunnel release.  After discussion, patient elected to proceed with endoscopic carpal tunnel release.  Risks and benefits of surgery were discussed including the risks of infection, bleeding, scarring, stiffness, nerve injury, vascular injury, tendon injury, need for subsequent operation, possible conversion open surgery, persistent symptoms.  He voiced understanding of these risks and elected to proceed.  OPERATIVE COURSE: Patient was seen and identified in the preoperative area and marked appropriately.  Surgical consent had been signed. Preoperative IV antibiotic prophylaxis was given. He was transferred to the operating room and placed in supine position with the Left upper extremity on an arm board.  General anesthesia was induced by the anesthesiologist.  Left upper extremity was prepped and draped in normal sterile orthopedic fashion.  A surgical pause was performed between the surgeons, anesthesia, and operating room staff and all were in  agreement as to the patient, procedure, and site of procedure.  Tourniquet was placed and padded appropriately for the upper arm.  The arm was exsanguinated and the tourniquet was inflated to .  A 1.5 cm skin incision was designed transversely proximal to the wrist flexion crease.  This incision was carried down through the subcutaneous tissues and through the forearm fascia.  A synovial elevator was introduced to identify the carpal tunnel space.  Sequential dilators were then used to open the carpal tunnel.  The cannula from the SafeView system was introduced in antegrade fashion into the carpal tunnel, and a standard wrist endoscope was used to visualize the undersurface of the transverse carpal ligament.  A probe and a rasp were used to delineate the distal edge of the transverse carpal ligament and to clear the underlying synovial tissues.  At this juncture, a forward cutting blade was introduced in an antegrade fashion was used to divide the transverse carpal ligament in its entirety.  Care was taken to ensure complete division of the structure by probing the resultant defect.  The endoscopic instruments were then removed.  At this point in the procedure, the median nerve was identified at the wrist flexion crease.  The distal end of the forearm fascia was released using tenotomy scissors with particular care taken to avoid injury to the palmar cutaneous branch of the median nerve.  The median nerve appeared completely decompressed in both the palm and distal forearm.  The wound was copiously irrigated, the tourniquet was deflated and hemostasis was achieved with bipolar electrocautery.  Tourniquet time was 15 minutes.  The wound was closed with 4-0 nylon suture in vertical mattress fashion.  Sterile dressings were applied.  Patient  was subsequently awoken from anesthesia and transported to the postoperative unit in stable condition.   Post-operative plan: The patient will recover in the  post-anesthesia care unit and then be discharged home.  The patient will be non weight bearing on the left upper extremity in a soft dressing.   I will see the patient back in the office in 2 weeks for postoperative followup.    Narada Uzzle, MD Electronically signed, 10/05/24

## 2024-10-05 NOTE — Anesthesia Postprocedure Evaluation (Signed)
"   Anesthesia Post Note  Patient: Brian Alvarez  Procedure(s) Performed: RELEASE, CARPAL TUNNEL, ENDOSCOPIC (Left: Hand)     Patient location during evaluation: PACU Anesthesia Type: General Level of consciousness: sedated and patient cooperative Pain management: pain level controlled Vital Signs Assessment: post-procedure vital signs reviewed and stable Respiratory status: spontaneous breathing Cardiovascular status: stable Anesthetic complications: no   No notable events documented.  Last Vitals:  Vitals:   10/05/24 0900 10/05/24 0907  BP: (!) 96/51 (!) 92/48  Pulse: 80 83  Resp: (!) 21 18  Temp:  (!) 36.2 C  SpO2: 94% 93%    Last Pain:  Vitals:   10/05/24 0907  TempSrc:   PainSc: 4                  [name]      "

## 2024-10-05 NOTE — Transfer of Care (Signed)
 Immediate Anesthesia Transfer of Care Note  Patient: Brian Alvarez  Procedure(s) Performed: Procedures (LRB): RELEASE, CARPAL TUNNEL, ENDOSCOPIC (Left)  Patient Location: PACU  Anesthesia Type: General  Level of Consciousness: awake, oriented, sedated and patient cooperative  Airway & Oxygen Therapy: Patient Spontanous Breathing and Patient connected to face mask oxygen  Post-op Assessment: Report given to PACU RN and Post -op Vital signs reviewed and stable  Post vital signs: Reviewed and stable  Complications: No apparent anesthesia complications Last Vitals:  Vitals Value Taken Time  BP 76/37 10/05/24 08:31  Temp 37.1 C 10/05/24 08:30  Pulse 97 10/05/24 08:33  Resp 20 10/05/24 08:33  SpO2 92 % 10/05/24 08:33  Vitals shown include unfiled device data.  Last Pain:  Vitals:   10/05/24 0830  TempSrc:   PainSc: 0-No pain      Patients Stated Pain Goal: 4 (10/05/24 0640)  Complications: No notable events documented.

## 2024-10-06 ENCOUNTER — Encounter (HOSPITAL_BASED_OUTPATIENT_CLINIC_OR_DEPARTMENT_OTHER): Payer: Self-pay | Admitting: Orthopedic Surgery

## 2024-10-11 ENCOUNTER — Encounter: Payer: Self-pay | Admitting: Occupational Therapy

## 2024-10-11 ENCOUNTER — Other Ambulatory Visit: Payer: Self-pay

## 2024-10-11 ENCOUNTER — Ambulatory Visit: Payer: Self-pay | Attending: Orthopedic Surgery | Admitting: Occupational Therapy

## 2024-10-11 DIAGNOSIS — R278 Other lack of coordination: Secondary | ICD-10-CM | POA: Insufficient documentation

## 2024-10-11 DIAGNOSIS — R29818 Other symptoms and signs involving the nervous system: Secondary | ICD-10-CM | POA: Insufficient documentation

## 2024-10-11 DIAGNOSIS — G5603 Carpal tunnel syndrome, bilateral upper limbs: Secondary | ICD-10-CM | POA: Insufficient documentation

## 2024-10-11 DIAGNOSIS — M6281 Muscle weakness (generalized): Secondary | ICD-10-CM | POA: Insufficient documentation

## 2024-10-11 DIAGNOSIS — R208 Other disturbances of skin sensation: Secondary | ICD-10-CM | POA: Insufficient documentation

## 2024-10-11 DIAGNOSIS — R29898 Other symptoms and signs involving the musculoskeletal system: Secondary | ICD-10-CM | POA: Insufficient documentation

## 2024-10-11 NOTE — Patient Instructions (Signed)
 [name]

## 2024-10-11 NOTE — Therapy (Signed)
 " OUTPATIENT OCCUPATIONAL THERAPY ORTHO EVALUATION  Patient Name: Brian Alvarez MRN: [id number] DOB:[birth year], 39 y.o., male Today's Date: 10/11/2024  PCP: Nedra Tinnie LABOR, NP  REFERRING PROVIDER: Arlinda Buster, MD  END OF SESSION:  OT End of Session - 10/11/24 1320     Visit Number 1    Number of Visits 9    Date for Recertification  12/14/24    Authorization Type BCBS    OT Start Time 1322    OT Stop Time 1401    OT Time Calculation (min) 39 min    Activity Tolerance Patient tolerated treatment well    Behavior During Therapy WFL for tasks assessed/performed          Past Medical History:  Diagnosis Date   ADHD    Allergy    Anemia    Anxiety    Asthma    Carpal tunnel syndrome    Depression    HTN (hypertension)    Insomnia    Lumbar herniated disc    Obesity    Sleep apnea    Past Surgical History:  Procedure Laterality Date   CARPAL TUNNEL RELEASE Left 10/05/2024   Procedure: RELEASE, CARPAL TUNNEL, ENDOSCOPIC;  Surgeon: Arlinda Buster, MD;  Location: [facility];  Service: Orthopedics;  Laterality: Left;   LUMBAR DISC SURGERY     Patient Active Problem List   Diagnosis Date Noted   Vitamin D  insufficiency 04/19/2024   Prediabetes 04/19/2024   Erectile dysfunction 11/08/2023   Anxiety and depression 11/08/2023   Abscess 09/29/2023   Chronic fatigue 12/03/2022   Primary hypertension 09/03/2022   Acute maxillary sinusitis 07/09/2022   Controlled substance agreement signed 06/07/2022   Chronic right-sided low back pain with right-sided sciatica 06/07/2022   Parasomnia, unspecified 10/19/2017   Reactive airway disease 10/19/2017   Confusional arousals 03/28/2017   PLMD (periodic limb movement disorder) 11/24/2016   Morbid obesity with body mass index of 45.0-49.9 in adult [facility]) 11/24/2016   Hypersomnia with sleep apnea 11/24/2016   Snoring 11/24/2016   Attention deficit hyperactivity disorder (ADHD) 04/29/2016   Seasonal allergies  09/12/2012   Asthma 12/26/2011   Carpal tunnel syndrome, left upper limb 11/11/2011    ONSET DATE: 09/06/2024 (Date of referral);   DATE OF PROCEDURE: 10/05/24 - Left endoscopic carpal tunnel release   REFERRING DIAG: G56.03 (ICD-10-CM) - Bilateral carpal tunnel syndrome   THERAPY DIAG:  Other lack of coordination  Muscle weakness (generalized)  Other disturbances of skin sensation  Other symptoms and signs involving the musculoskeletal system  Other symptoms and signs involving the nervous system  Rationale for Evaluation and Treatment: Rehabilitation  SUBJECTIVE:   SUBJECTIVE STATEMENT: He was hesitant to get CTR as it would have kept him out of work but if thankful he was able to get it done endoscopically and work from home. The plan is to get the R side done in the near future. No prehab prior to surgery. Symptoms of B CTS for years. He has a 92 year old daughter in first grade.   Pt accompanied by: self  PERTINENT HISTORY: PMH: asthma, ADHD, HTN, anxiety and depression, prediabetes, and lumbar disc surgery  PRECAUTIONS: None  WEIGHT BEARING RESTRICTIONS: No  PAIN:  Are you having pain? Yes: NPRS scale: at rest 2-3/10 and with movement 5-6/10 Pain location: L wrist and palm of hand Pain description: tender, achey Aggravating factors: pressure, movement Relieving factors: rest, medications  FALLS: Has patient fallen in last 6 months? 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 Erwin  OBJECTIVE:  Note: Objective measures were completed at Evaluation  unless otherwise noted.  HAND DOMINANCE: Right  ADLs: WFL  FUNCTIONAL OUTCOME MEASURES: Quick Dash: 75.0 % disability with use of LUE following CTR   UPPER EXTREMITY ROM:    BUE WFL with pain on L hand with digit movement  UPPER EXTREMITY MMT:     BUE WFL  HAND FUNCTION: Grip strength: Right: 112.4 lbs; Left: 39.4 lbs Lateral pinch: Right: 25 lbs, Left: 22 lbs 3 point pinch: Right: 15 lbs, Left: 10 lbs Tip pinch: Right 17 lbs, Left: 15 lbs  COORDINATION: 9 Hole Peg test: Right: 25 sec; Left: 25 sec  SENSATION: Reports severe paresthesias  EDEMA: mild edema reported and observed  COGNITION: Overall cognitive status: Within functional limits for tasks assessed  OBSERVATIONS: Pt ambulates without use of AD. No loss of balance. The pt appears well kept and has glasses donned.   TREATMENT:                                                                                                                             OT educated pt on rehabilitation process and results of objective measures in relation to pt specific goals.   OT educated pt on completion of tendon glides to assist with pain and swelling.   PATIENT EDUCATION: Education details: OT role and POC Person educated: Patient Education method: Explanation, Demonstration, and Handouts Education comprehension: verbalized understanding and needs further education  HOME EXERCISE PROGRAM: 10/11/2024: tendon glides  GOALS:  SHORT TERM GOALS: Target date: 11/08/2024   Patient will demonstrate initial L UE HEP with 25% verbal cues or less for proper execution. Baseline: Goal status: INITIAL  2.  Pt will independently recall the 5 main sensory precautions (cold, heat, sharp, chemical, and heavy) as needed to prevent injury/harm secondary to impairments.   Baseline:  Goal status: INITIAL  3.  Pt will independently recall at least 3 joint protection, ergonomics, and body mechanic principles as noted in pt instructions.    Baseline:  Goal status: INITIAL  4.  Pt will independently recall sleep positioning options as noted in pt instructions to improve reported sleep disturbances.  Baseline:  Goal status: INITIAL  LONG TERM GOALS: Target date: 12/14/2024   Patient will demonstrate updated UE HEP with visual handouts only for proper execution. Baseline:  Goal status: INITIAL  2.  Patient will demonstrate at least 16% improvement with quick Dash score (reporting 59% disability or less) indicating improved functional use of affected extremity.  Baseline: 75.0 %  disability with use of LUE following CTR Goal status: INITIAL  3.  OT to retest and set appropriate goal for R grip and pinch strength following R CTR.  Baseline:  Goal status: INITIAL  4.  Patient will demonstrate at least 60 lbs L grip strength as needed to open jars and other containers. Baseline: Right: 112.4 lbs; Left: 39.4 lbs Goal status: INITIAL  5.  Patient will demonstrate at least 13 lbs L 3 point pinch strength as needed to open jars and other containers. Baseline: Right: 15 lbs, Left: 10 lbs Goal status: INITIAL  ASSESSMENT:  CLINICAL IMPRESSION: Patient is a 39 y.o. male who was seen today for occupational therapy evaluation for B CTS following L CTR 10/05/2024. Hx includes asthma, ADHD, HTN, anxiety and depression, prediabetes, and lumbar disc surgery. Patient currently presents below baseline level of functioning demonstrating functional deficits and impairments as noted below. Pt would benefit from skilled OT services in the outpatient setting to work on impairments as noted below to help pt return to PLOF as able.    PERFORMANCE DEFICITS: in functional skills including ADLs, IADLs, coordination, dexterity, proprioception, sensation, edema, pain, fascial restrictions, Fine motor control, decreased knowledge of precautions, decreased knowledge of use of DME, wound, skin integrity, and UE functional use.   IMPAIRMENTS: are  limiting patient from ADLs, IADLs, rest and sleep, work, leisure, and social participation.   COMORBIDITIES: may have co-morbidities  that affects occupational performance. Patient will benefit from skilled OT to address above impairments and improve overall function.  MODIFICATION OR ASSISTANCE TO COMPLETE EVALUATION: No modification of tasks or assist necessary to complete an evaluation.  OT OCCUPATIONAL PROFILE AND HISTORY: Problem focused assessment: Including review of records relating to presenting problem.  CLINICAL DECISION MAKING: LOW - limited treatment options, no task modification necessary  REHAB POTENTIAL: Good  EVALUATION COMPLEXITY: Low      PLAN:  OT FREQUENCY: 1x/week  OT DURATION: 8 weeks  PLANNED INTERVENTIONS: 97168 OT Re-evaluation, 97535 self care/ADL training, 02889 therapeutic exercise, 97530 therapeutic activity, 97112 neuromuscular re-education, 97140 manual therapy, 97035 ultrasound, 97018 paraffin, 02960 fluidotherapy, 97010 moist heat, 97750 Physical Performance Testing, 02239 Orthotic Initial, 97763 Orthotic/Prosthetic subsequent, passive range of motion, coping strategies training, patient/family education, and DME and/or AE instructions  RECOMMENDED OTHER SERVICES: N/A for this visit  CONSULTED AND AGREED WITH PLAN OF CARE: Patient  PLAN FOR NEXT SESSION: review tendon glides; median nerve glides; joint protection; ergonomics; sleep positioning; scar management   Jocelyn CHRISTELLA Bottom, OT 10/11/2024, 4:44 PM   "

## 2024-10-18 ENCOUNTER — Ambulatory Visit: Admitting: Orthopedic Surgery

## 2024-10-18 DIAGNOSIS — Z9889 Other specified postprocedural states: Secondary | ICD-10-CM

## 2024-10-18 DIAGNOSIS — G5603 Carpal tunnel syndrome, bilateral upper limbs: Secondary | ICD-10-CM

## 2024-10-18 NOTE — Progress Notes (Signed)
" ° °  Brian Alvarez - 39 y.o. male MRN [id number]  Date of birth: 10/24/1985  Office Visit Note: Visit Date: 10/18/2024 PCP: Nedra Tinnie LABOR, NP Referred by: Nedra Tinnie LABOR, NP  Subjective:  HPI: Brian Alvarez is a 39 y.o. male who presents today for follow up 2 weeks status post right endoscopic carpal tunnel release. He is doing quite well postoperatively, pain is well controlled. He has completed 1 session of OT and has improved grip strength with diminished numbness and tingling.   Pertinent ROS were reviewed with the patient and found to be negative unless otherwise specified above in HPI.   Assessment & Plan: Visit Diagnoses:  1. Bilateral carpal tunnel syndrome   2. S/P endoscopic carpal tunnel release     Plan: Sutures are removed today. He is counseled to continue OT with gradual return to normal activities. He will follow up with us  in 4 weeks when he is 6 weeks out, and at that time discuss scheduling the left wrist endoscopic carpal tunnel release.   Follow-up: No follow-ups on file.   Meds & Orders: No orders of the defined types were placed in this encounter.  No orders of the defined types were placed in this encounter.    Procedures: No procedures performed       Objective:   Vital Signs: There were no vitals taken for this visit.  Ortho Exam Right hand: - Well-healing wrist incision, sutures removed, skin edges well-approximated without erythema or drainage - Composite fist without restriction - 5/5 APB mild thenar atrophy   Imaging: No results found.  Joesph Dinsmore, OPA-C "

## 2024-10-23 ENCOUNTER — Ambulatory Visit: Attending: Orthopedic Surgery | Admitting: Occupational Therapy

## 2024-10-23 DIAGNOSIS — R29898 Other symptoms and signs involving the musculoskeletal system: Secondary | ICD-10-CM

## 2024-10-23 DIAGNOSIS — M6281 Muscle weakness (generalized): Secondary | ICD-10-CM

## 2024-10-23 DIAGNOSIS — R208 Other disturbances of skin sensation: Secondary | ICD-10-CM

## 2024-10-23 DIAGNOSIS — R278 Other lack of coordination: Secondary | ICD-10-CM

## 2024-10-23 DIAGNOSIS — R29818 Other symptoms and signs involving the nervous system: Secondary | ICD-10-CM

## 2024-10-23 NOTE — Patient Instructions (Addendum)
 "  You can place your arms out to the sides with pillows underneath or place a pillow across your stomach with your hands resting on top for support.   Hold a pillow with your arms away from your body.  Do not bend your arms at the elbows or tuck your hands under your head. Do not place your hand on top or underneath pillow. Use outside of pillow as a border.     Joint Protection Principles Joint protection principles are a series of techniques which can be included into all activities. This  will reduce the stress on your joints. Joints that have been weakened by arthritis are at risk of  being damaged by stress and strain. Improper use of diseased joints may lead to impaired  function and deformity. Joint protection techniques are ways of doing activities so that the risk of  deformity is decreased. 1. Respect For Pain   Stop activities before you reach the point of discomfort or pain.   Limit activities which cause your pain to last more than one hour after you have stopped   the activity. 2. Balance Activity And Rest   Rest before becoming tired.   Plan rest periods during longer or more difficult activities.   By resting 10 minutes during an activity, you will have more energy to continue. 3. Avoid Activities Which Cannot Be Stopped   When you begin to feel joint pain, stop. This will eliminate excessive pain and fatigue later.   Prioritize activities. Consider the activity, length of time, and difficulty before beginning.   Plan difficult activities for peak energy times. 4. Use Larger, Stronger Joints For Activities, When Possible, Distributing The Weight   Over Non-involved Or Stronger Joints.  To lift a bag from a counter, bend your knees,   hug the bag with both arms. Bend your elbows   so that the bag is held tightly to your chest and   straighten your knees. Keep hold on the bag   by keeping your elbows bent. If the load is too   heavy, push shopping cart, or get  help with   groceries - use drive-up service.  You can use your hip to push open doors,   and your feet to close lower drawers. OVER  [facility] Therapist Phone  [phone] (8/07) - Page 2 of 6   An envelope briefcase with a snap lock can   be used rather than an attache case. By bending  the elbows, the case can be carried under the arm   so that the case rests on the forearm. Hold the   case by resting your arm against your body. Switch   the case from one side of the body to the other.   Use the larger joints (elbow or shoulder) to carry   the weight of the purse.  Wrong: The weight of the purse is all   on the weak fingers.   Wing faucets: Keeping wrists extended, use the   palm of your left hand to turn on a left faucet and   the back of your left hand to turn the faucet off.   Four-pronged or circular faucets: Place palm of   hand on top of the faucet keeping fingers extended.   Straighten your elbow and apply a downward force   on faucet, pushing from your shoulder. Keeping   fingers and elbow extended, turn your arm inward   toward  your thumb. Right: The stronger elbow   should carry the weight   of the purse. CONTINUED ON NEXT PAGE  [id number] (8/07) - Page 3 of 6  Wrong: Do not use fingers to lift heavy   roasting pans or dishes.  Wrong: All the weight of the pot would   on your weak fingers. 5. Avoid Staying In One Position For Extended Periods Of Time.   Plan rest periods.   Change your position.   Stretch and relax your joints. 6. Maintain Or Use Your Joints In Good Alignment.   Maintain proper posture.  This is good alignment. Avoid or change activities that cause  your fingers to move towards the little  finger side of your hand. OVER  Right: Use oven mitts and lift with  palms, using the stronger  wrists and elbows to  do the work. Right: Pick up the pot with two  hands, using your palms. [id number]  (8/07) - Page 4 of 6  Use the palms of your hands for lifting   and pushing. Push instead of pulling. 7. Maintain Proper Weight.   Additional weight can stress weight-bearing   joints (hip, knees, feet, back). Special Considerations For The Hands  1. AVOID TIGHT GRASP.   Use a relaxed grip.   Enlarge handles.  Place palm of hand on jar lid, and using   weight if body, turn arm at shoulder to   open jar. A sponge or wet towel under   the jar prevents sliding.  2. AVOID PRESSURE ON BACK OF KNUCKLES (MP JOINTS).  Wrong Right When rising from chair or bed. CONTINUED ON NEXT PAGE  Dishwashing should be done with fingers kept  straight as much as possible. It a dishwasher is  available, use it in preference to washing by  hand. Hold the knife or mixing spoon like a dagger,  with the handle parallel to knuckles. Cutting is  then changed from sawing to pulling. [id number] (8/07) - Page 5 of 6  3. USE BOTH HANDS WHEN POSSIBLE  4. AVOID REPETITIVE HAND ACTIVITIES   Take breaks   Change activity, i.e. using screwdriver, crocheting.  5. AVOID PRESSURE TO TIP OF THUMB   Example: pushing snaps together, opening car doors, ringing doorbells.  To protect thumb joints, open milk   containers with heels of the hands   rather than thumbs. Posture Whether walking, standing, sitting or even sleeping,  good posture is important for people with arthritis.  Poor posture can make arthritis worse. As for standing,  you should stand straight, head high, shoulders back,  stomach in, and hips and knees straight. Walking Walk erect, as in standing position. Arm swing freely  at sides; let your weight shift easily from side to the  other. Dont carry heavy packages in one hand. A  lightweight shoulder bag is a good idea. If legs or  knees are involved, a cane will make walking easier. Ring top cans: Hold the can with one  hand. With the other hand, place a  knife through the ring with handle of   knife directly over the opening. Using  the palm of your hand, push down on  the handle of the knife. OVER  [id number] (8/07) - Page 6 of 6 Resting/Sleeping Patients with rheumatoid arthritis should avoid bent knees  or arms. Lie straight at sides, knees and hips straight. Use a  firm mattress or put plywood board between mattress and  bedspring. If you need a pillow  under your head, use a thin  one. Keep sheets and blankets loose over your feet, perhaps by  using a blanket support. If your arthritis is in your back, you  may need a different position for sleeping. Ask your doctor. Sitting Keep good posture when sitting down. Use straight-back  armchairs with firm seats. Sit with head up, shoulders back,  stomach in, feet flat on floor. Use arms of chair to stand up  slowly "

## 2024-10-30 ENCOUNTER — Ambulatory Visit: Payer: Self-pay | Admitting: Occupational Therapy

## 2024-11-06 ENCOUNTER — Ambulatory Visit: Payer: Self-pay | Admitting: Occupational Therapy

## 2024-11-13 ENCOUNTER — Ambulatory Visit: Payer: Self-pay | Admitting: Occupational Therapy

## 2024-11-15 ENCOUNTER — Encounter: Admitting: Orthopedic Surgery

## 2024-11-20 ENCOUNTER — Ambulatory Visit: Payer: Self-pay

## 2024-11-27 ENCOUNTER — Ambulatory Visit: Payer: Self-pay | Admitting: Occupational Therapy

## 2024-12-04 ENCOUNTER — Ambulatory Visit: Payer: Self-pay | Admitting: Occupational Therapy

## 2024-12-11 ENCOUNTER — Ambulatory Visit: Payer: Self-pay
